# Patient Record
Sex: Female | Born: 1955
Health system: Southern US, Community
[De-identification: ages and names within clinical notes are randomized; demographics above are authoritative.]

## PROBLEM LIST (undated history)

## (undated) DIAGNOSIS — K219 Gastro-esophageal reflux disease without esophagitis: Secondary | ICD-10-CM

## (undated) DIAGNOSIS — K649 Unspecified hemorrhoids: Secondary | ICD-10-CM

## (undated) DIAGNOSIS — G5601 Carpal tunnel syndrome, right upper limb: Secondary | ICD-10-CM

## (undated) DIAGNOSIS — F5104 Psychophysiologic insomnia: Secondary | ICD-10-CM

## (undated) DIAGNOSIS — Z972 Presence of dental prosthetic device (complete) (partial): Secondary | ICD-10-CM

## (undated) DIAGNOSIS — Z780 Asymptomatic menopausal state: Secondary | ICD-10-CM

## (undated) DIAGNOSIS — M48061 Spinal stenosis, lumbar region without neurogenic claudication: Secondary | ICD-10-CM

## (undated) DIAGNOSIS — T7840XA Allergy, unspecified, initial encounter: Secondary | ICD-10-CM

## (undated) DIAGNOSIS — E8881 Metabolic syndrome: Secondary | ICD-10-CM

## (undated) DIAGNOSIS — E119 Type 2 diabetes mellitus without complications: Secondary | ICD-10-CM

## (undated) DIAGNOSIS — E669 Obesity, unspecified: Secondary | ICD-10-CM

## (undated) DIAGNOSIS — N63 Unspecified lump in unspecified breast: Secondary | ICD-10-CM

## (undated) DIAGNOSIS — K5909 Other constipation: Secondary | ICD-10-CM

## (undated) DIAGNOSIS — M1990 Unspecified osteoarthritis, unspecified site: Secondary | ICD-10-CM

## (undated) DIAGNOSIS — I10 Essential (primary) hypertension: Secondary | ICD-10-CM

## (undated) DIAGNOSIS — E039 Hypothyroidism, unspecified: Secondary | ICD-10-CM

## (undated) DIAGNOSIS — M7541 Impingement syndrome of right shoulder: Secondary | ICD-10-CM

## (undated) DIAGNOSIS — R809 Proteinuria, unspecified: Secondary | ICD-10-CM

## (undated) HISTORY — DX: Gastro-esophageal reflux disease without esophagitis: K21.9

## (undated) HISTORY — DX: Metabolic syndrome: E88.810

## (undated) HISTORY — DX: Unspecified hemorrhoids: K64.9

## (undated) HISTORY — DX: Essential (primary) hypertension: I10

## (undated) HISTORY — DX: Impingement syndrome of right shoulder: M75.41

## (undated) HISTORY — PX: TONSILLECTOMY: SUR1361

## (undated) HISTORY — PX: TUBAL LIGATION: SHX77

## (undated) HISTORY — DX: Obesity, unspecified: E66.9

## (undated) HISTORY — DX: Asymptomatic menopausal state: Z78.0

## (undated) HISTORY — DX: Type 2 diabetes mellitus without complications: E11.9

## (undated) HISTORY — DX: Proteinuria, unspecified: R80.9

## (undated) HISTORY — DX: Unspecified lump in unspecified breast: N63.0

## (undated) HISTORY — DX: Spinal stenosis, lumbar region without neurogenic claudication: M48.061

## (undated) HISTORY — DX: Other constipation: K59.09

## (undated) HISTORY — DX: Metabolic syndrome: E88.81

## (undated) HISTORY — DX: Carpal tunnel syndrome, right upper limb: G56.01

## (undated) HISTORY — DX: Psychophysiologic insomnia: F51.04

## (undated) HISTORY — DX: Allergy, unspecified, initial encounter: T78.40XA

## (undated) HISTORY — DX: Hypothyroidism, unspecified: E03.9

---

## 2004-11-14 ENCOUNTER — Ambulatory Visit: Payer: Self-pay | Admitting: Family Medicine

## 2005-09-04 LAB — HM DEXA SCAN

## 2005-11-20 ENCOUNTER — Ambulatory Visit: Payer: Self-pay

## 2006-05-07 LAB — HM COLONOSCOPY: HM Colonoscopy: NORMAL

## 2006-11-06 ENCOUNTER — Ambulatory Visit: Payer: Self-pay | Admitting: Gastroenterology

## 2006-11-22 ENCOUNTER — Ambulatory Visit: Payer: Self-pay | Admitting: Family Medicine

## 2006-11-26 ENCOUNTER — Ambulatory Visit: Payer: Self-pay | Admitting: Family Medicine

## 2008-04-13 ENCOUNTER — Ambulatory Visit: Payer: Self-pay | Admitting: Family Medicine

## 2008-04-13 DIAGNOSIS — N63 Unspecified lump in unspecified breast: Secondary | ICD-10-CM

## 2008-04-26 ENCOUNTER — Ambulatory Visit: Payer: Self-pay | Admitting: Family Medicine

## 2008-04-27 ENCOUNTER — Ambulatory Visit: Payer: Self-pay | Admitting: Family Medicine

## 2008-05-07 HISTORY — PX: CARPAL TUNNEL RELEASE: SHX101

## 2008-10-05 ENCOUNTER — Ambulatory Visit: Payer: Self-pay | Admitting: Specialist

## 2008-10-14 ENCOUNTER — Ambulatory Visit: Payer: Self-pay | Admitting: Specialist

## 2009-09-01 ENCOUNTER — Ambulatory Visit: Payer: Self-pay | Admitting: Family Medicine

## 2010-09-15 ENCOUNTER — Ambulatory Visit: Payer: Self-pay | Admitting: Family Medicine

## 2011-12-10 LAB — HM PAP SMEAR: HM PAP: NORMAL

## 2012-01-01 ENCOUNTER — Ambulatory Visit: Payer: Self-pay | Admitting: Family Medicine

## 2013-01-08 ENCOUNTER — Ambulatory Visit: Payer: Self-pay | Admitting: Family Medicine

## 2013-09-29 ENCOUNTER — Ambulatory Visit: Payer: Self-pay | Admitting: Physical Medicine and Rehabilitation

## 2014-01-12 ENCOUNTER — Ambulatory Visit: Payer: Self-pay | Admitting: Family Medicine

## 2014-01-12 LAB — HM MAMMOGRAPHY: HM Mammogram: NORMAL

## 2014-05-26 LAB — LIPID PANEL
Cholesterol: 178 mg/dL (ref 0–200)
HDL: 52 mg/dL (ref 35–70)
LDL Cholesterol: 112 mg/dL
TRIGLYCERIDES: 72 mg/dL (ref 40–160)

## 2014-11-21 ENCOUNTER — Encounter: Payer: Self-pay | Admitting: Family Medicine

## 2014-11-21 DIAGNOSIS — E785 Hyperlipidemia, unspecified: Secondary | ICD-10-CM | POA: Insufficient documentation

## 2014-11-21 DIAGNOSIS — I10 Essential (primary) hypertension: Secondary | ICD-10-CM | POA: Insufficient documentation

## 2014-11-21 DIAGNOSIS — R809 Proteinuria, unspecified: Secondary | ICD-10-CM | POA: Insufficient documentation

## 2014-11-21 DIAGNOSIS — E039 Hypothyroidism, unspecified: Secondary | ICD-10-CM | POA: Insufficient documentation

## 2014-11-21 DIAGNOSIS — IMO0002 Reserved for concepts with insufficient information to code with codable children: Secondary | ICD-10-CM | POA: Insufficient documentation

## 2014-11-21 DIAGNOSIS — G4700 Insomnia, unspecified: Secondary | ICD-10-CM | POA: Insufficient documentation

## 2014-11-21 DIAGNOSIS — M170 Bilateral primary osteoarthritis of knee: Secondary | ICD-10-CM | POA: Insufficient documentation

## 2014-11-21 DIAGNOSIS — J309 Allergic rhinitis, unspecified: Secondary | ICD-10-CM | POA: Insufficient documentation

## 2014-11-21 DIAGNOSIS — K644 Residual hemorrhoidal skin tags: Secondary | ICD-10-CM | POA: Insufficient documentation

## 2014-11-21 DIAGNOSIS — M48061 Spinal stenosis, lumbar region without neurogenic claudication: Secondary | ICD-10-CM | POA: Insufficient documentation

## 2014-11-21 DIAGNOSIS — Z780 Asymptomatic menopausal state: Secondary | ICD-10-CM | POA: Insufficient documentation

## 2014-11-21 DIAGNOSIS — M7540 Impingement syndrome of unspecified shoulder: Secondary | ICD-10-CM | POA: Insufficient documentation

## 2014-11-21 DIAGNOSIS — K5909 Other constipation: Secondary | ICD-10-CM | POA: Insufficient documentation

## 2014-11-21 DIAGNOSIS — K219 Gastro-esophageal reflux disease without esophagitis: Secondary | ICD-10-CM | POA: Insufficient documentation

## 2014-11-21 DIAGNOSIS — G5600 Carpal tunnel syndrome, unspecified upper limb: Secondary | ICD-10-CM | POA: Insufficient documentation

## 2014-11-21 DIAGNOSIS — E8881 Metabolic syndrome: Secondary | ICD-10-CM | POA: Insufficient documentation

## 2014-11-23 ENCOUNTER — Ambulatory Visit (INDEPENDENT_AMBULATORY_CARE_PROVIDER_SITE_OTHER): Payer: 59 | Admitting: Family Medicine

## 2014-11-23 ENCOUNTER — Encounter: Payer: Self-pay | Admitting: Family Medicine

## 2014-11-23 VITALS — BP 130/60 | HR 104 | Temp 98.8°F | Resp 18 | Ht 63.5 in | Wt 198.6 lb

## 2014-11-23 DIAGNOSIS — I10 Essential (primary) hypertension: Secondary | ICD-10-CM

## 2014-11-23 DIAGNOSIS — R000 Tachycardia, unspecified: Secondary | ICD-10-CM

## 2014-11-23 DIAGNOSIS — K5900 Constipation, unspecified: Secondary | ICD-10-CM | POA: Diagnosis not present

## 2014-11-23 DIAGNOSIS — E038 Other specified hypothyroidism: Secondary | ICD-10-CM | POA: Diagnosis not present

## 2014-11-23 DIAGNOSIS — E785 Hyperlipidemia, unspecified: Secondary | ICD-10-CM | POA: Diagnosis not present

## 2014-11-23 DIAGNOSIS — K5909 Other constipation: Secondary | ICD-10-CM

## 2014-11-23 MED ORDER — AMLODIPINE BESY-BENAZEPRIL HCL 5-40 MG PO CAPS: 1.0000 | ORAL_CAPSULE | Freq: Every morning | ORAL | Status: DC

## 2014-11-23 MED ORDER — HYDROCHLOROTHIAZIDE 12.5 MG PO TABS: 12.5000 mg | ORAL_TABLET | Freq: Every morning | ORAL | Status: DC

## 2014-11-23 MED ORDER — METOPROLOL SUCCINATE ER 50 MG PO TB24: 50.0000 mg | ORAL_TABLET | Freq: Every day | ORAL | Status: DC

## 2014-11-23 MED ORDER — ATORVASTATIN CALCIUM 40 MG PO TABS: 40.0000 mg | ORAL_TABLET | Freq: Every day | ORAL | Status: DC

## 2014-11-23 NOTE — Progress Notes (Signed)
Name: Judy Graves   MRN: [id number]    DOB: 1956/01/01   Date:11/23/2014       Progress Note  Subjective  Chief Complaint  Chief Complaint  Patient presents with  . Medication Refill    3 month F/U  . Hyperlipidemia  . Hypothyroidism    Getting worst-Patient is experiencing Dry Skin, Hair Loss, Cold and Heat Intolerance, Constipation  . Hypertension  . Gastrophageal Reflux    Patient is experncing alot of gas, and feels like medication is not helping  . Constipation    When patient takes Linzess, she feels like it dehydrates hers and has to run to the bathroom multiple times.     HPI  HTN: she is taking medications as prescribed, still has some lower extremity edema, no chest pain or palpitation.  BP is at goal today  Hyperlipidemia: last lipid panel was not at goal, still taking Atorvastatin 20 mg, dose was supposed to be increased, tolerating medication well.  GERD: she has gas, but denies epigastric pain or heartburn.  Constipation: she takes Linzess prn only because it causes diarrhea, did not respond to Miralax  Hypothyroidism: she is always tired, takes medication as directed. She also has constipation and dry skin  Depression: she states she has been depressed since April when father was diagnosed with CHF and was under Hospice. He died in the beginning of 2022/11/08.  She denies crying spells, she has anhedonia, feels sad. She refuses medication  Patient Active Problem List   Diagnosis Date Noted  . Benign hypertension 11/21/2014  . Chronic constipation 11/21/2014  . Insomnia, persistent 11/21/2014  . Dyslipidemia 11/21/2014  . Gastro-esophageal reflux disease without esophagitis 11/21/2014  . External hemorrhoids 11/21/2014  . Adult hypothyroidism 11/21/2014  . Impingement syndrome of shoulder 11/21/2014  . Menopause 11/21/2014  . Dysmetabolic syndrome 76/28/3151  . Carpal tunnel syndrome 11/21/2014  . Adult BMI 30+ 11/21/2014  . Allergic rhinitis 11/21/2014  .  Primary osteoarthritis of both knees 11/21/2014  . Abnormal presence of protein in urine 11/21/2014  . Lumbar canal stenosis 11/21/2014  . Lump or mass in breast 04/13/2008    Past Surgical History  Procedure Laterality Date  . Tubal ligation    . Tonsillectomy    . Carpal tunnel release Right 2010    Dr. Sabra Heck    Family History  Problem Relation Age of Onset  . Cancer Mother     Breast  . Diabetes Mother   . Hypertension Father   . CVA Father     34's  . Hypertension Sister   . Hypertension Brother     History   Social History  . Marital Status: Married    Spouse Name: N/A  . Number of Children: N/A  . Years of Education: N/A   Occupational History  . Not on file.   Social History Main Topics  . Smoking status: Never Smoker   . Smokeless tobacco: Never Used  . Alcohol Use: No  . Drug Use: No  . Sexual Activity:    Partners: Male   Other Topics Concern  . Not on file   Social History Narrative  . No narrative on file     Current outpatient prescriptions:  .  amLODipine-benazepril (LOTREL) 5-40 MG per capsule, Take 1 capsule by mouth every morning., Disp: 30 capsule, Rfl: 3 .  aspirin 81 MG chewable tablet, Chew 1 tablet by mouth daily., Disp: , Rfl:  .  atorvastatin (LIPITOR) 40 MG tablet, Take 1  tablet (40 mg total) by mouth daily., Disp: 30 tablet, Rfl: 3 .  hydrochlorothiazide (HYDRODIURIL) 12.5 MG tablet, Take 1 tablet (12.5 mg total) by mouth every morning., Disp: 30 tablet, Rfl: 3 .  lansoprazole (PREVACID) 30 MG capsule, Take 1 capsule by mouth 2 (two) times daily., Disp: , Rfl:  .  levothyroxine (SYNTHROID, LEVOTHROID) 50 MCG tablet, Take 1 tablet by mouth daily. And 2 on Sundays, Disp: , Rfl:  .  Linaclotide (LINZESS) 145 MCG CAPS capsule, Take 1 capsule by mouth daily., Disp: , Rfl:  .  metoprolol succinate (TOPROL-XL) 50 MG 24 hr tablet, Take 1 tablet (50 mg total) by mouth daily., Disp: 30 tablet, Rfl: 3  No Known  Allergies   ROS  Constitutional: Negative for fever or weight change.  Respiratory: Negative for cough and shortness of breath.   Cardiovascular: Negative for chest pain or palpitations.  Gastrointestinal: Negative for abdominal pain, no bowel changes.  Musculoskeletal: Negative for gait problem or joint swelling.  Skin: Negative for rash.  Neurological: Negative for dizziness or headache.  No other specific complaints in a complete review of systems (except as listed in HPI above).  Objective  Filed Vitals:   11/23/14 1050  BP: 130/60  Pulse: 104  Temp: 98.8 F (37.1 C)  TempSrc: Oral  Resp: 18  Height: 5' 3.5" (1.613 m)  Weight: 198 lb 9.6 oz (90.084 kg)  SpO2: 99%    Body mass index is 34.62 kg/(m^2).  Physical Exam   Constitutional: Patient appears well-developed and well-nourished. Obese No distress.  Eyes:  No scleral icterus. PERL Neck: Normal range of motion. Neck supple. Cardiovascular: Normal rate, regular rhythm and normal heart sounds.  No murmur heard. No BLE edema. Pulmonary/Chest: Effort normal and breath sounds normal. No respiratory distress. Abdominal: Soft.  There is no tenderness. Psychiatric: Patient was upset, always seems angry during her visit, poor eye contact   Judgment and thought content normal.    PHQ2/9: Depression screen PHQ 2/9 11/23/2014  Decreased Interest 0  Down, Depressed, Hopeless 0  PHQ - 2 Score 0    Fall Risk: Fall Risk  11/23/2014  Falls in the past year? No      Assessment & Plan  1. Benign hypertension  - metoprolol succinate (TOPROL-XL) 50 MG 24 hr tablet; Take 1 tablet (50 mg total) by mouth daily.  Dispense: 30 tablet; Refill: 3 - hydrochlorothiazide (HYDRODIURIL) 12.5 MG tablet; Take 1 tablet (12.5 mg total) by mouth every morning.  Dispense: 30 tablet; Refill: 3 - amLODipine-benazepril (LOTREL) 5-40 MG per capsule; Take 1 capsule by mouth every morning.  Dispense: 30 capsule; Refill: 3  2. Chronic  constipation Taking Linzess prn only   3. Dyslipidemia Increase dose of Lipitor - atorvastatin (LIPITOR) 40 MG tablet; Take 1 tablet (40 mg total) by mouth daily.  Dispense: 30 tablet; Refill: 3  4. Other specified hypothyroidism Continue current dose of Levothyroxine   5. Tachycardia We will check TSH and CBC

## 2014-11-25 ENCOUNTER — Other Ambulatory Visit: Payer: Self-pay | Admitting: Family Medicine

## 2014-11-25 LAB — CBC WITH DIFFERENTIAL/PLATELET
BASOS ABS: 0 10*3/uL (ref 0.0–0.2)
Basos: 0 %
EOS (ABSOLUTE): 0.1 10*3/uL (ref 0.0–0.4)
Eos: 2 %
Hematocrit: 40 % (ref 34.0–46.6)
Hemoglobin: 13.8 g/dL (ref 11.1–15.9)
IMMATURE GRANS (ABS): 0 10*3/uL (ref 0.0–0.1)
Immature Granulocytes: 0 %
LYMPHS ABS: 3.6 10*3/uL — AB (ref 0.7–3.1)
Lymphs: 43 %
MCH: 30.3 pg (ref 26.6–33.0)
MCHC: 34.5 g/dL (ref 31.5–35.7)
MCV: 88 fL (ref 79–97)
MONOCYTES: 9 %
MONOS ABS: 0.8 10*3/uL (ref 0.1–0.9)
Neutrophils Absolute: 3.8 10*3/uL (ref 1.4–7.0)
Neutrophils: 46 %
Platelets: 227 10*3/uL (ref 150–379)
RBC: 4.56 x10E6/uL (ref 3.77–5.28)
RDW: 13.3 % (ref 12.3–15.4)
WBC: 8.3 10*3/uL (ref 3.4–10.8)

## 2014-11-25 LAB — TSH: TSH: 2.79 u[IU]/mL (ref 0.450–4.500)

## 2014-12-04 ENCOUNTER — Other Ambulatory Visit: Payer: Self-pay | Admitting: Family Medicine

## 2015-02-21 ENCOUNTER — Telehealth: Payer: Self-pay | Admitting: Family Medicine

## 2015-02-21 ENCOUNTER — Other Ambulatory Visit: Payer: Self-pay

## 2015-02-21 DIAGNOSIS — E785 Hyperlipidemia, unspecified: Secondary | ICD-10-CM

## 2015-02-21 DIAGNOSIS — I10 Essential (primary) hypertension: Secondary | ICD-10-CM

## 2015-02-21 NOTE — Telephone Encounter (Signed)
Due to husband's job her pharmacy has now changed to the [facility] on [address] in [place].  Patient also needs the following refills:  Metoprolol Lansoprazole Levothyroxine Hydrochlorothiazide Atorvastatin

## 2015-02-22 ENCOUNTER — Other Ambulatory Visit: Payer: Self-pay

## 2015-02-22 DIAGNOSIS — I10 Essential (primary) hypertension: Secondary | ICD-10-CM

## 2015-02-22 MED ORDER — ATORVASTATIN CALCIUM 40 MG PO TABS: 40.0000 mg | ORAL_TABLET | Freq: Every day | ORAL | Status: DC

## 2015-02-22 MED ORDER — HYDROCHLOROTHIAZIDE 12.5 MG PO TABS: 12.5000 mg | ORAL_TABLET | Freq: Every morning | ORAL | Status: DC

## 2015-02-22 MED ORDER — LANSOPRAZOLE 30 MG PO CPDR: 30.0000 mg | DELAYED_RELEASE_CAPSULE | Freq: Two times a day (BID) | ORAL | Status: DC

## 2015-02-22 MED ORDER — LEVOTHYROXINE SODIUM 50 MCG PO TABS: 50.0000 ug | ORAL_TABLET | Freq: Every day | ORAL | Status: DC

## 2015-02-22 MED ORDER — AMLODIPINE BESY-BENAZEPRIL HCL 5-40 MG PO CAPS: 1.0000 | ORAL_CAPSULE | Freq: Every morning | ORAL | Status: DC

## 2015-02-22 MED ORDER — METOPROLOL SUCCINATE ER 50 MG PO TB24: 50.0000 mg | ORAL_TABLET | Freq: Every day | ORAL | Status: DC

## 2015-02-22 NOTE — Telephone Encounter (Signed)
Her ins is requiring a 90 day supply

## 2015-04-07 ENCOUNTER — Ambulatory Visit: Payer: 59 | Admitting: Family Medicine

## 2015-04-07 ENCOUNTER — Encounter: Payer: Self-pay | Admitting: Family Medicine

## 2015-04-07 ENCOUNTER — Ambulatory Visit (INDEPENDENT_AMBULATORY_CARE_PROVIDER_SITE_OTHER): Payer: 59 | Admitting: Family Medicine

## 2015-04-07 VITALS — BP 140/82 | HR 91 | Temp 98.8°F | Resp 16 | Ht 66.0 in | Wt 202.8 lb

## 2015-04-07 DIAGNOSIS — E785 Hyperlipidemia, unspecified: Secondary | ICD-10-CM | POA: Diagnosis not present

## 2015-04-07 DIAGNOSIS — Z87898 Personal history of other specified conditions: Secondary | ICD-10-CM

## 2015-04-07 DIAGNOSIS — K5909 Other constipation: Secondary | ICD-10-CM

## 2015-04-07 DIAGNOSIS — K219 Gastro-esophageal reflux disease without esophagitis: Secondary | ICD-10-CM | POA: Diagnosis not present

## 2015-04-07 DIAGNOSIS — G4700 Insomnia, unspecified: Secondary | ICD-10-CM | POA: Diagnosis not present

## 2015-04-07 DIAGNOSIS — I10 Essential (primary) hypertension: Secondary | ICD-10-CM

## 2015-04-07 DIAGNOSIS — E038 Other specified hypothyroidism: Secondary | ICD-10-CM

## 2015-04-07 DIAGNOSIS — K5900 Constipation, unspecified: Secondary | ICD-10-CM

## 2015-04-07 MED ORDER — ATORVASTATIN CALCIUM 40 MG PO TABS: 40.0000 mg | ORAL_TABLET | Freq: Every day | ORAL | Status: DC

## 2015-04-07 MED ORDER — AMLODIPINE BESYLATE 10 MG PO TABS: 10.0000 mg | ORAL_TABLET | Freq: Every day | ORAL | Status: DC

## 2015-04-07 NOTE — Progress Notes (Signed)
Name: Judy Graves   MRN: [id number]    DOB: [birth year]   Date:04/07/2015       Progress Note  Subjective  Chief Complaint  Chief Complaint  Patient presents with  . Medication Refill    follow-up  . Hypertension  . Hyperlipidemia  . Hypothyroidism  . Constipation    resolved    HPI  Chronic constipation: used to take Linzess, but now bowel movements are regulated without the medication, normal bowel movements twice daily. No abdominal pain, no blood in stools. She states she stopped sodas and is drinking more water daily. She also started eating more fruit  HTN: bp is at goal, compliant with medication. She had to go to Urgent Care recently with facial swelling ( both eyes and lips - worse on left side ), no problems breathing. She was continued on the ACE inhibitor and given follow up with allergist. Explained that ace can cause angioedema, we will change medication from Lotrel to Norvasc 10 mg. Keep follow up with allergist. No chest pain.   Hypothyroidism: she has noticed some weight gain, also feeling more tired than usual. Compliant with medication  Hyperlipidemia: taking Lipitor daily, she has some nocturnal cramping, advised to drink more water and try co-Q 10 supplementation otc.   GERD: she states she is taking medication, but continues to have some substernal chest pain when she goes to bed, eats right before she goes to bed.   Insomnia; she is not taking any medication at this time, lost her mother-in-law last year, her father this Summer and her 59 yo niece one week ago. She does not want to take prescription medication. She works 2nd shift.  She would like to try otc medication first.   Patient Active Problem List   Diagnosis Date Noted  . Benign hypertension 11/21/2014  . Chronic constipation 11/21/2014  . Insomnia, persistent 11/21/2014  . Dyslipidemia 11/21/2014  . Gastro-esophageal reflux disease without esophagitis 11/21/2014  . External hemorrhoids  11/21/2014  . Adult hypothyroidism 11/21/2014  . Impingement syndrome of shoulder 11/21/2014  . Menopause 11/21/2014  . Dysmetabolic syndrome 123XX123  . Carpal tunnel syndrome 11/21/2014  . Adult BMI 30+ 11/21/2014  . Allergic rhinitis 11/21/2014  . Primary osteoarthritis of both knees 11/21/2014  . Abnormal presence of protein in urine 11/21/2014  . Lumbar canal stenosis 11/21/2014  . Lump or mass in breast 04/13/2008    Past Surgical History  Procedure Laterality Date  . Tubal ligation    . Tonsillectomy    . Carpal tunnel release Right 2010    Dr. Sabra Heck    Family History  Problem Relation Age of Onset  . Cancer Mother     Breast  . Diabetes Mother   . Hypertension Father   . CVA Father     77's  . Hypertension Sister   . Hypertension Brother     Social History   Social History  . Marital Status: Married    Spouse Name: N/A  . Number of Children: N/A  . Years of Education: N/A   Occupational History  . Not on file.   Social History Main Topics  . Smoking status: Never Smoker   . Smokeless tobacco: Never Used  . Alcohol Use: No  . Drug Use: No  . Sexual Activity:    Partners: Male   Other Topics Concern  . Not on file   Social History Narrative     Current outpatient prescriptions:  .  amLODipine (NORVASC) 10  MG tablet, Take 1 tablet (10 mg total) by mouth daily. In place of Lotrel secondary to angioedema, Disp: 90 tablet, Rfl: 0 .  aspirin 81 MG chewable tablet, Chew 1 tablet by mouth daily., Disp: , Rfl:  .  atorvastatin (LIPITOR) 40 MG tablet, Take 1 tablet (40 mg total) by mouth daily., Disp: 90 tablet, Rfl: 2 .  hydrochlorothiazide (HYDRODIURIL) 12.5 MG tablet, Take 1 tablet (12.5 mg total) by mouth every morning., Disp: 30 tablet, Rfl: 3 .  lansoprazole (PREVACID) 30 MG capsule, Take 1 capsule (30 mg total) by mouth 2 (two) times daily., Disp: 90 capsule, Rfl: 3 .  levothyroxine (SYNTHROID, LEVOTHROID) 50 MCG tablet, Take 1 tablet (50 mcg  total) by mouth daily. And 2 on Sundays, Disp: 90 tablet, Rfl: 3 .  metoprolol succinate (TOPROL-XL) 50 MG 24 hr tablet, Take 1 tablet (50 mg total) by mouth daily., Disp: 90 tablet, Rfl: 3  Allergies  Allergen Reactions  . Ace Inhibitors Swelling     ROS  Constitutional: Negative for fever but has  weight change.  Respiratory: Negative for cough and shortness of breath.   Cardiovascular: Negative for chest pain or palpitations.  Gastrointestinal: Negative for abdominal pain, no bowel changes.  Musculoskeletal: Negative for gait problem or joint swelling.  Skin: Negative for rash.  Neurological: Negative for dizziness or headache.  No other specific complaints in a complete review of systems (except as listed in HPI above).  Objective  Filed Vitals:   04/07/15 1049  BP: 140/82  Pulse: 91  Temp: 98.8 F (37.1 C)  TempSrc: Oral  Resp: 16  Height: 5\' 6"  (1.676 m)  Weight: 202 lb 12.8 oz (91.989 kg)  SpO2: 94%    Body mass index is 32.75 kg/(m^2).  Physical Exam  Constitutional: Patient appears well-developed and well-nourished. Obese  No distress.  HEENT: head atraumatic, normocephalic, pupils equal and reactive to light, neck supple, throat within normal limits Cardiovascular: Normal rate, regular rhythm and normal heart sounds.  No murmur heard. No BLE edema. Pulmonary/Chest: Effort normal and breath sounds normal. No respiratory distress. Abdominal: Soft.  There is no tenderness. Psychiatric: Patient has a normal mood and affect. behavior is normal. Judgment and thought content normal.   PHQ2/9: Depression screen [facility] 2/9 04/07/2015 11/23/2014  Decreased Interest 0 0  Down, Depressed, Hopeless 0 0  PHQ - 2 Score 0 0    Fall Risk: Fall Risk  04/07/2015 11/23/2014  Falls in the past year? No No     Functional Status Survey: Is the patient deaf or have difficulty hearing?: No Does the patient have difficulty seeing, even when wearing glasses/contacts?: Yes  (glasses) Does the patient have difficulty concentrating, remembering, or making decisions?: No Does the patient have difficulty walking or climbing stairs?: No Does the patient have difficulty dressing or bathing?: No Does the patient have difficulty doing errands alone such as visiting a doctor's office or shopping?: No    Assessment & Plan  1. Chronic constipation  Doing well, off medication   2. Benign hypertension  Adjust medication because of angioedema - amLODipine (NORVASC) 10 MG tablet; Take 1 tablet (10 mg total) by mouth daily. In place of Lotrel secondary to angioedema  Dispense: 90 tablet; Refill: 0  - Comprehensive metabolic panel  3. Dyslipidemia  - atorvastatin (LIPITOR) 40 MG tablet; Take 1 tablet (40 mg total) by mouth daily.  Dispense: 90 tablet; Refill: 2  4. Other specified hypothyroidism  -TSH  5. Insomnia, persistent  She will  try otc medication   6. History of angioedema  Keep follow up with allergist, stop ACE   7. Gastro-esophageal reflux disease without esophagitis  May take Lanzoprazole before evening meal, do not eat within 1-2 hours before going to bed

## 2015-04-08 LAB — COMPREHENSIVE METABOLIC PANEL
ALK PHOS: 88 IU/L (ref 39–117)
ALT: 23 IU/L (ref 0–32)
AST: 16 IU/L (ref 0–40)
Albumin/Globulin Ratio: 1.7 (ref 1.1–2.5)
Albumin: 4.3 g/dL (ref 3.5–5.5)
BUN/Creatinine Ratio: 24 — ABNORMAL HIGH (ref 9–23)
BUN: 22 mg/dL (ref 6–24)
Bilirubin Total: 0.5 mg/dL (ref 0.0–1.2)
CALCIUM: 9.5 mg/dL (ref 8.7–10.2)
CO2: 27 mmol/L (ref 18–29)
CREATININE: 0.92 mg/dL (ref 0.57–1.00)
Chloride: 99 mmol/L (ref 97–106)
GFR calc Af Amer: 79 mL/min/{1.73_m2} (ref >59)
GFR calc non Af Amer: 69 mL/min/{1.73_m2} (ref >59)
GLOBULIN, TOTAL: 2.6 g/dL (ref 1.5–4.5)
GLUCOSE: 120 mg/dL — AB (ref 65–99)
POTASSIUM: 4.9 mmol/L (ref 3.5–5.2)
SODIUM: 140 mmol/L (ref 136–144)
Total Protein: 6.9 g/dL (ref 6.0–8.5)

## 2015-04-08 LAB — TSH: TSH: 4.16 u[IU]/mL (ref 0.450–4.500)

## 2015-05-11 ENCOUNTER — Encounter: Payer: Self-pay | Admitting: Family Medicine

## 2015-05-11 ENCOUNTER — Ambulatory Visit (INDEPENDENT_AMBULATORY_CARE_PROVIDER_SITE_OTHER): Payer: 59 | Admitting: Family Medicine

## 2015-05-11 VITALS — BP 138/72 | HR 90 | Temp 98.3°F | Resp 18 | Ht 66.0 in | Wt 204.9 lb

## 2015-05-11 DIAGNOSIS — I10 Essential (primary) hypertension: Secondary | ICD-10-CM | POA: Diagnosis not present

## 2015-05-11 DIAGNOSIS — R600 Localized edema: Secondary | ICD-10-CM

## 2015-05-11 MED ORDER — CHLORTHALIDONE 25 MG PO TABS: 25.0000 mg | ORAL_TABLET | Freq: Every day | ORAL | Status: DC

## 2015-05-11 NOTE — Progress Notes (Signed)
Name: Judy Graves   MRN: [id number]    DOB: 07-13-1955   Date:05/11/2015       Progress Note  Subjective  Chief Complaint  Chief Complaint  Patient presents with  . Medication Management    1 month F/U BP  . Hypertension    Last visit took patient off of Lotrel and added Amlodipine 10 mg due to angioedema. Symptoms are unchanged, still having swelling in her feet and ankles.    HPI  HTN: she had an angioedema and we had to stop Benazepril, since than she has been on 10 mg of Norvasc, 12.5 mg of HCTZ, and Metoprolol ER 50 mg daily, she has noticed significant lower extremity edema and does not want to take the medication. BP has been at goal. She is very frustrated , afraid of trying ARB. No chest pain, no palpitation, but she has noticed some decrease in exercise tolerance   Patient Active Problem List   Diagnosis Date Noted  . Benign hypertension 11/21/2014  . Chronic constipation 11/21/2014  . Insomnia, persistent 11/21/2014  . Dyslipidemia 11/21/2014  . Gastro-esophageal reflux disease without esophagitis 11/21/2014  . External hemorrhoids 11/21/2014  . Adult hypothyroidism 11/21/2014  . Impingement syndrome of shoulder 11/21/2014  . Menopause 11/21/2014  . Dysmetabolic syndrome 123XX123  . Carpal tunnel syndrome 11/21/2014  . Adult BMI 30+ 11/21/2014  . Allergic rhinitis 11/21/2014  . Primary osteoarthritis of both knees 11/21/2014  . Abnormal presence of protein in urine 11/21/2014  . Lumbar canal stenosis 11/21/2014  . Lump or mass in breast 04/13/2008    Past Surgical History  Procedure Laterality Date  . Tubal ligation    . Tonsillectomy    . Carpal tunnel release Right 2010    Dr. Sabra Heck    Family History  Problem Relation Age of Onset  . Cancer Mother     Breast  . Diabetes Mother   . Hypertension Father   . CVA Father     33's  . Hypertension Sister   . Hypertension Brother     Social History   Social History  . Marital Status: Married    Spouse Name: N/A  . Number of Children: N/A  . Years of Education: N/A   Occupational History  . Not on file.   Social History Main Topics  . Smoking status: Never Smoker   . Smokeless tobacco: Never Used  . Alcohol Use: No  . Drug Use: No  . Sexual Activity:    Partners: Male   Other Topics Concern  . Not on file   Social History Narrative     Current outpatient prescriptions:  .  aspirin 81 MG chewable tablet, Chew 1 tablet by mouth daily., Disp: , Rfl:  .  atorvastatin (LIPITOR) 40 MG tablet, Take 1 tablet (40 mg total) by mouth daily., Disp: 90 tablet, Rfl: 2 .  lansoprazole (PREVACID) 30 MG capsule, Take 1 capsule (30 mg total) by mouth 2 (two) times daily., Disp: 90 capsule, Rfl: 3 .  levothyroxine (SYNTHROID, LEVOTHROID) 50 MCG tablet, Take 1 tablet (50 mcg total) by mouth daily. And 2 on Sundays, Disp: 90 tablet, Rfl: 3 .  metoprolol succinate (TOPROL-XL) 50 MG 24 hr tablet, Take 1 tablet (50 mg total) by mouth daily., Disp: 90 tablet, Rfl: 3 .  chlorthalidone (HYGROTON) 25 MG tablet, Take 1 tablet (25 mg total) by mouth daily., Disp: 30 tablet, Rfl: 0  Allergies  Allergen Reactions  . Ace Inhibitors Swelling  ROS  Ten systems reviewed and is negative except as mentioned in HPI   Objective  Filed Vitals:   05/11/15 1116  BP: 138/72  Pulse: 90  Temp: 98.3 F (36.8 C)  TempSrc: Oral  Resp: 18  Height: 5\' 6"  (1.676 m)  Weight: 204 lb 14.4 oz (92.942 kg)  SpO2: 98%    Body mass index is 33.09 kg/(m^2).  Physical Exam  Constitutional: Patient appears well-developed and well-nourished. Obese  No distress.  HEENT: head atraumatic, normocephalic, pupils equal and reactive to light,  neck supple, throat within normal limits Cardiovascular: Normal rate, regular rhythm and normal heart sounds.  No murmur heard. 1 plus pitting  BLE edema. Pulmonary/Chest: Effort normal and breath sounds normal. No respiratory distress. Abdominal: Soft.  There is no  tenderness. Psychiatric: Patient has a normal mood and affect. behavior is normal. Judgment and thought content normal.  Recent Results (from the past 2160 hour(s))  Comprehensive metabolic panel     Status: Abnormal   Collection Time: 04/07/15 11:56 AM  Result Value Ref Range   Glucose 120 (H) 65 - 99 mg/dL   BUN 22 6 - 24 mg/dL   Creatinine, Ser 0.92 0.57 - 1.00 mg/dL   GFR calc non Af Amer 69 >59 mL/min/1.73   GFR calc Af Amer 79 >59 mL/min/1.73   BUN/Creatinine Ratio 24 (H) 9 - 23   Sodium 140 136 - 144 mmol/L    Comment: **Effective April 18, 2015 the reference interval**   for Sodium, Serum will be changing to:                                             134 - 144    Potassium 4.9 3.5 - 5.2 mmol/L   Chloride 99 97 - 106 mmol/L    Comment: **Effective April 18, 2015 the reference interval**   for Chloride, Serum will be changing to:                                              96 - 106    CO2 27 18 - 29 mmol/L   Calcium 9.5 8.7 - 10.2 mg/dL   Total Protein 6.9 6.0 - 8.5 g/dL   Albumin 4.3 3.5 - 5.5 g/dL   Globulin, Total 2.6 1.5 - 4.5 g/dL   Albumin/Globulin Ratio 1.7 1.1 - 2.5   Bilirubin Total 0.5 0.0 - 1.2 mg/dL   Alkaline Phosphatase 88 39 - 117 IU/L   AST 16 0 - 40 IU/L   ALT 23 0 - 32 IU/L  TSH     Status: None   Collection Time: 04/07/15 11:56 AM  Result Value Ref Range   TSH 4.160 0.450 - 4.500 uIU/mL     PHQ2/9: Depression screen [facility] 2/9 04/07/2015 11/23/2014  Decreased Interest 0 0  Down, Depressed, Hopeless 0 0  PHQ - 2 Score 0 0    Fall Risk: Fall Risk  04/07/2015 11/23/2014  Falls in the past year? No No     Assessment & Plan  1. Benign hypertension  Advised to increase potassium intake and check potassium level on her next visit - chlorthalidone (HYGROTON) 25 MG tablet; Take 1 tablet (25 mg total) by mouth daily.  Dispense: 30 tablet;  Refill: 0  2. Bilateral edema of lower extremity  Stop Norvasc

## 2015-05-11 NOTE — Patient Instructions (Signed)

## 2015-06-09 ENCOUNTER — Telehealth: Payer: Self-pay

## 2015-06-09 DIAGNOSIS — I10 Essential (primary) hypertension: Secondary | ICD-10-CM

## 2015-06-09 MED ORDER — CHLORTHALIDONE 25 MG PO TABS: 25.0000 mg | ORAL_TABLET | Freq: Every day | ORAL | Status: DC

## 2015-06-09 NOTE — Telephone Encounter (Signed)
Patient states she was given 30 pills of Chlorthalidone and her follow up is Tuesday 06/14/15 but will run out tomorrow 06/10/15. What would you like her to do?

## 2015-06-14 ENCOUNTER — Encounter: Payer: Self-pay | Admitting: Family Medicine

## 2015-06-14 ENCOUNTER — Ambulatory Visit (INDEPENDENT_AMBULATORY_CARE_PROVIDER_SITE_OTHER): Payer: 59 | Admitting: Family Medicine

## 2015-06-14 VITALS — BP 136/70 | HR 99 | Temp 98.8°F | Resp 16 | Ht 66.0 in | Wt 202.1 lb

## 2015-06-14 DIAGNOSIS — M25462 Effusion, left knee: Secondary | ICD-10-CM | POA: Diagnosis not present

## 2015-06-14 DIAGNOSIS — R600 Localized edema: Secondary | ICD-10-CM | POA: Diagnosis not present

## 2015-06-14 DIAGNOSIS — E785 Hyperlipidemia, unspecified: Secondary | ICD-10-CM | POA: Diagnosis not present

## 2015-06-14 DIAGNOSIS — E038 Other specified hypothyroidism: Secondary | ICD-10-CM

## 2015-06-14 DIAGNOSIS — I10 Essential (primary) hypertension: Secondary | ICD-10-CM | POA: Diagnosis not present

## 2015-06-14 MED ORDER — CHLORTHALIDONE 25 MG PO TABS: 25.0000 mg | ORAL_TABLET | Freq: Every day | ORAL | Status: DC

## 2015-06-14 NOTE — Progress Notes (Signed)
Name: Judy Graves   MRN: [id number]    DOB: [birth year]   Date:06/14/2015       Progress Note  Subjective  Chief Complaint  Chief Complaint  Patient presents with  . Hypertension    patient is here for a 1- month f/u    HPI  HTN: she had an angioedema and we had to stop Benazepril, she was taking Norvac 10 mg and  12.5 mg of HCTZ plus  Metoprolol ER 50 mg daily, but it caused significant lower extremity edema, therefore we stopped Norvasc and HCTZ in January and she has been taking Chlorthalidone 25 mg and Metoprolol without side effects and BP has been at goal. No chest pain, no palpitation.  Left knee pain and effusion: she has pain on left knee when at work and has to stand on cement floors for 8 hours. Recently she has also noticed swelling of left knee. Pain is described as sharp pain on the anterior aspect of knee, no redness or increase in warmth, aggravated by standing or walking, better with rest. Not taking any medication for it. Explained likely OA, but she would like to see Dr. Marry Guan and have effusion drained.   Hypothyroidism: last TSH was towards upper limits of normal, we will recheck today, she is frustrated about inability to lose weight.   Dyslipidemia: taking Lipitor, denies myalgias, no chest pain. Recheck labs   Patient Active Problem List   Diagnosis Date Noted  . Benign hypertension 11/21/2014  . Chronic constipation 11/21/2014  . Insomnia, persistent 11/21/2014  . Dyslipidemia 11/21/2014  . Gastro-esophageal reflux disease without esophagitis 11/21/2014  . External hemorrhoids 11/21/2014  . Adult hypothyroidism 11/21/2014  . Impingement syndrome of shoulder 11/21/2014  . Menopause 11/21/2014  . Dysmetabolic syndrome [id number]  . Carpal tunnel syndrome 11/21/2014  . Adult BMI 30+ 11/21/2014  . Allergic rhinitis 11/21/2014  . Primary osteoarthritis of both knees 11/21/2014  . Abnormal presence of protein in urine 11/21/2014  . Lumbar canal stenosis  11/21/2014  . Lump or mass in breast 04/13/2008    Past Surgical History  Procedure Laterality Date  . Tubal ligation    . Tonsillectomy    . Carpal tunnel release Right 2010    Dr. Sabra Heck    Family History  Problem Relation Age of Onset  . Cancer Mother     Breast  . Diabetes Mother   . Hypertension Father   . CVA Father     50's  . Hypertension Sister   . Hypertension Brother     Social History   Social History  . Marital Status: Married    Spouse Name: N/A  . Number of Children: N/A  . Years of Education: N/A   Occupational History  . Not on file.   Social History Main Topics  . Smoking status: Never Smoker   . Smokeless tobacco: Never Used  . Alcohol Use: No  . Drug Use: No  . Sexual Activity:    Partners: Male   Other Topics Concern  . Not on file   Social History Narrative     Current outpatient prescriptions:  .  aspirin 81 MG chewable tablet, Chew 1 tablet by mouth daily., Disp: , Rfl:  .  atorvastatin (LIPITOR) 40 MG tablet, Take 1 tablet (40 mg total) by mouth daily., Disp: 90 tablet, Rfl: 2 .  chlorthalidone (HYGROTON) 25 MG tablet, Take 1 tablet (25 mg total) by mouth daily., Disp: 30 tablet, Rfl: 0 .  EPIPEN 2-PAK  0.3 MG/0.3ML SOAJ injection, AS DIRECTED AS DIRECTED INJECTION 2, Disp: , Rfl: 1 .  lansoprazole (PREVACID) 30 MG capsule, Take 1 capsule (30 mg total) by mouth 2 (two) times daily., Disp: 90 capsule, Rfl: 3 .  levothyroxine (SYNTHROID, LEVOTHROID) 50 MCG tablet, Take 1 tablet (50 mcg total) by mouth daily. And 2 on Sundays, Disp: 90 tablet, Rfl: 3 .  metoprolol succinate (TOPROL-XL) 50 MG 24 hr tablet, Take 1 tablet (50 mg total) by mouth daily., Disp: 90 tablet, Rfl: 3  Allergies  Allergen Reactions  . Ace Inhibitors Swelling     ROS  Constitutional: Negative for fever , positive for slight weight change.  Respiratory: Negative for cough, she has some  shortness of breath with activity ( able to go up a flight of stairs ).    Cardiovascular: Negative for chest pain or palpitations.  Gastrointestinal: Negative for abdominal pain, no bowel changes.  Musculoskeletal: Negative for gait problem , positive for left knee  joint swelling.  Skin: Negative for rash.   Neurological: Negative for dizziness or headache.  No other specific complaints in a complete review of systems (except as listed in HPI above).  Objective  Filed Vitals:   06/14/15 1010  BP: 136/70  Pulse: 99  Temp: 98.8 F (37.1 C)  TempSrc: Oral  Resp: 16  Height: 5\' 6" (1.676 m)  Weight: 202 lb 1.6 oz (91.672 kg)  SpO2: 96%    Body mass index is 32.64 kg/(m^2).  Physical Exam  Constitutional: Patient appears well-developed and well-nourished. Obese No distress.  HEENT: head atraumatic, normocephalic, pupils equal and reactive to light,  neck supple, throat within normal limits Cardiovascular: Normal rate, regular rhythm and normal heart sounds.  No murmur heard. Trace BLE edema. Pulmonary/Chest: Effort normal and breath sounds normal. No respiratory distress. Abdominal: Soft.  There is no tenderness. Psychiatric: Patient has a normal mood and affect. behavior is normal. Judgment and thought content normal. Muscular Skeletal: crepitus with extension of left knee and effusion, no redness or increase in warmth Skin: two wart like lesion on thighs and a dark raised lesion on left anterior neck ( she wants to contact Dermatologist for biopsy)  Recent Results (from the past 2160 hour(s))  Comprehensive metabolic panel     Status: Abnormal   Collection Time: 04/07/15 11:56 AM  Result Value Ref Range   Glucose 120 (H) 65 - 99 mg/dL   BUN 22 6 - 24 mg/dL   Creatinine, Ser 0.92 0.57 - 1.00 mg/dL   GFR calc non Af Amer 69 >59 mL/min/1.73   GFR calc Af Amer 79 >59 mL/min/1.73   BUN/Creatinine Ratio 24 (H) 9 - 23   Sodium 140 136 - 144 mmol/L    Comment: **Effective April 18, 2015 the reference interval**   for Sodium, Serum will be changing  to:                                             13 4 - 144    Potassium 4.9 3.5 - 5.2 mmol/L   Chloride 99 97 - 106 mmol/L    Comment: **Effective April 18, 2015 the reference interval**   for Chloride, Serum will be changing to:  96 - 106    CO2 27 18 - 29 mmol/L   Calcium 9.5 8.7 - 10.2 mg/dL   Total Protein 6.9 6.0 - 8.5 g/dL   Albumin 4.3 3.5 - 5.5 g/dL   Globulin, Total 2.6 1.5 - 4.5 g/dL   Albumin/Globulin Ratio 1.7 1.1 - 2.5   Bilirubin Total 0.5 0.0 - 1.2 mg/dL   Alkaline Phosphatase 88 39 - 117 IU/L   AST 16 0 - 40 IU/L   ALT 23 0 - 32 IU/L  TSH     Status: None   Collection Time: 04/07/15 11:56 AM  Result Value Ref Range   TSH 4.160 0.450 - 4.500 uIU/mL     PHQ2/9: Depression screen [facility] 2/9 06/14/2015 04/07/2015 11/23/2014  Decreased Interest 0 0 0  Down, Depressed, Hopeless 0 0 0  PHQ - 2 Score 0 0 0     Fall Risk: Fall Risk  06/14/2015 04/07/2015 11/23/2014  Falls in the past year? No No No      Functional Status Survey: Is the patient deaf or have difficulty hearing?: No Does the patient have difficulty seeing, even when wearing glasses/contacts?: No Does the patient have difficulty concentrating, remembering, or making decisions?: No Does the patient have difficulty walking or climbing stairs?: No Does the patient have difficulty dressing or bathing?: No Does the patient have difficulty doing errands alone such as visiting a doctor's office or shopping?: No    Assessment & Plan  1. Benign hypertension  At goal  - Potassium - chlorthalidone (HYGROTON) 25 MG tablet; Take 1 tablet (25 mg total) by mouth daily.  Dispense: 90 tablet; Refill: 1  2. Bilateral edema of lower extremity  Improved since stopped Norvasc and started Chlorthalidone   3. Knee effusion, left  - Ambulatory referral to Orthopedic Surgery  4. Dyslipidemia  - Lipid panel  5. Other specified hypothyroidism  - TSH

## 2015-06-15 LAB — LIPID PANEL
Chol/HDL Ratio: 3 ratio units (ref 0.0–4.4)
Cholesterol, Total: 132 mg/dL (ref 100–199)
HDL: 44 mg/dL (ref >39)
LDL CALC: 72 mg/dL (ref 0–99)
Triglycerides: 82 mg/dL (ref 0–149)
VLDL CHOLESTEROL CAL: 16 mg/dL (ref 5–40)

## 2015-06-15 LAB — POTASSIUM: Potassium: 3.6 mmol/L (ref 3.5–5.2)

## 2015-06-15 LAB — TSH: TSH: 3.6 u[IU]/mL (ref 0.450–4.500)

## 2015-08-19 ENCOUNTER — Other Ambulatory Visit: Payer: Self-pay | Admitting: Family Medicine

## 2015-08-19 NOTE — Telephone Encounter (Signed)
Patient requesting refill. 

## 2015-09-08 ENCOUNTER — Other Ambulatory Visit: Payer: Self-pay | Admitting: Physician Assistant

## 2015-09-08 DIAGNOSIS — M2392 Unspecified internal derangement of left knee: Secondary | ICD-10-CM

## 2015-09-09 DIAGNOSIS — H25013 Cortical age-related cataract, bilateral: Secondary | ICD-10-CM | POA: Diagnosis not present

## 2015-09-27 ENCOUNTER — Ambulatory Visit
Admission: RE | Admit: 2015-09-27 | Discharge: 2015-09-27 | Disposition: A | Discharge status: Home / Self Care | Payer: BLUE CROSS/BLUE SHIELD | Source: Ambulatory Visit | Attending: Physician Assistant | Admitting: Physician Assistant

## 2015-09-27 DIAGNOSIS — M7122 Synovial cyst of popliteal space [Baker], left knee: Secondary | ICD-10-CM | POA: Insufficient documentation

## 2015-09-27 DIAGNOSIS — S83242A Other tear of medial meniscus, current injury, left knee, initial encounter: Secondary | ICD-10-CM | POA: Insufficient documentation

## 2015-09-27 DIAGNOSIS — M25462 Effusion, left knee: Secondary | ICD-10-CM | POA: Insufficient documentation

## 2015-09-27 DIAGNOSIS — S83282A Other tear of lateral meniscus, current injury, left knee, initial encounter: Secondary | ICD-10-CM | POA: Diagnosis not present

## 2015-09-27 DIAGNOSIS — M2392 Unspecified internal derangement of left knee: Secondary | ICD-10-CM | POA: Diagnosis not present

## 2015-09-27 DIAGNOSIS — M2342 Loose body in knee, left knee: Secondary | ICD-10-CM | POA: Diagnosis not present

## 2015-10-14 ENCOUNTER — Encounter: Payer: Self-pay | Admitting: Family Medicine

## 2015-10-14 ENCOUNTER — Ambulatory Visit (INDEPENDENT_AMBULATORY_CARE_PROVIDER_SITE_OTHER): Payer: BLUE CROSS/BLUE SHIELD | Admitting: Family Medicine

## 2015-10-14 VITALS — BP 132/78 | HR 83 | Temp 98.4°F | Resp 18 | Ht 66.0 in | Wt 199.2 lb

## 2015-10-14 DIAGNOSIS — M7122 Synovial cyst of popliteal space [Baker], left knee: Secondary | ICD-10-CM | POA: Diagnosis not present

## 2015-10-14 DIAGNOSIS — K219 Gastro-esophageal reflux disease without esophagitis: Secondary | ICD-10-CM | POA: Diagnosis not present

## 2015-10-14 DIAGNOSIS — I10 Essential (primary) hypertension: Secondary | ICD-10-CM | POA: Diagnosis not present

## 2015-10-14 DIAGNOSIS — S83207A Unspecified tear of unspecified meniscus, current injury, left knee, initial encounter: Secondary | ICD-10-CM | POA: Insufficient documentation

## 2015-10-14 DIAGNOSIS — S83207D Unspecified tear of unspecified meniscus, current injury, left knee, subsequent encounter: Secondary | ICD-10-CM | POA: Diagnosis not present

## 2015-10-14 DIAGNOSIS — R600 Localized edema: Secondary | ICD-10-CM | POA: Diagnosis not present

## 2015-10-14 DIAGNOSIS — R252 Cramp and spasm: Secondary | ICD-10-CM | POA: Diagnosis not present

## 2015-10-14 DIAGNOSIS — G4700 Insomnia, unspecified: Secondary | ICD-10-CM

## 2015-10-14 DIAGNOSIS — M7120 Synovial cyst of popliteal space [Baker], unspecified knee: Secondary | ICD-10-CM | POA: Insufficient documentation

## 2015-10-14 DIAGNOSIS — E038 Other specified hypothyroidism: Secondary | ICD-10-CM

## 2015-10-14 DIAGNOSIS — E785 Hyperlipidemia, unspecified: Secondary | ICD-10-CM

## 2015-10-14 MED ORDER — LEVOTHYROXINE SODIUM 50 MCG PO TABS: 50.0000 ug | ORAL_TABLET | Freq: Every day | ORAL | Status: DC

## 2015-10-14 MED ORDER — CHLORTHALIDONE 25 MG PO TABS: 25.0000 mg | ORAL_TABLET | Freq: Every day | ORAL | Status: DC

## 2015-10-14 MED ORDER — METOPROLOL SUCCINATE ER 50 MG PO TB24: 50.0000 mg | ORAL_TABLET | Freq: Every day | ORAL | Status: DC

## 2015-10-14 MED ORDER — ATORVASTATIN CALCIUM 40 MG PO TABS: 40.0000 mg | ORAL_TABLET | Freq: Every day | ORAL | Status: DC

## 2015-10-14 NOTE — Progress Notes (Signed)
Name: Judy Graves   MRN: [id number]    DOB: 1955-09-25   Date:10/14/2015       Progress Note  Subjective  Chief Complaint  Chief Complaint  Patient presents with  . Medication Refill    4 month F/U   . Hypertension    Edema in bilateral ankles   . Gastroesophageal Reflux    Well controlled with daily use  . Hyperlipidemia    Patient complains of muscle cramps in feet and calves constantly  . Hypothyroidism    Dry skin, hair loss and cold with heat intolerance.    HPI  HTN: she had an angioedema and we had to stop Benazepril, she was taking Norvac 10 mg and 12.5 mg of HCTZ plus Metoprolol ER 50 mg daily, but it caused significant lower extremity edema, therefore we stopped Norvasc and HCTZ in January and she has been taking Chlorthalidone 25 mg and Metoprolol , she has noticed leg cramps past couple of months and also her feet, episodes are sporadic, at most twice weekly, she walks and it resolves.  BP has been at goal. No chest pain, no palpitation.  Left knee pain and effusion: she was seen by  Dr. Marry Guan and have effusion drained, MRI showed medial meniscal tear and waiting to find out the next step.  Hypothyroidism:TSH is stable, having mild dry skin, no longer has constipation, but she has noticed mild hair loss. She has lost a few lbs since last visit  Dyslipidemia: taking Lipitor, denies myalgias, no chest pain. Labs reviewed with patient today  GERD: taking Prevacid, she has occasional  regurgitation, heartburn or epigastric pain   Patient Active Problem List   Diagnosis Date Noted  . Tear of meniscus of left knee 10/14/2015  . Baker's cyst of knee 10/14/2015  . Benign hypertension 11/21/2014  . Chronic constipation 11/21/2014  . Insomnia, persistent 11/21/2014  . Dyslipidemia 11/21/2014  . Gastro-esophageal reflux disease without esophagitis 11/21/2014  . External hemorrhoids 11/21/2014  . Adult hypothyroidism 11/21/2014  . Impingement syndrome of shoulder  11/21/2014  . Menopause 11/21/2014  . Dysmetabolic syndrome 123XX123  . Carpal tunnel syndrome 11/21/2014  . Adult BMI 30+ 11/21/2014  . Allergic rhinitis 11/21/2014  . Primary osteoarthritis of both knees 11/21/2014  . Abnormal presence of protein in urine 11/21/2014  . Lumbar canal stenosis 11/21/2014  . Lump or mass in breast 04/13/2008    Past Surgical History  Procedure Laterality Date  . Tubal ligation    . Tonsillectomy    . Carpal tunnel release Right 2010    Dr. Sabra Heck    Family History  Problem Relation Age of Onset  . Cancer Mother     Breast  . Diabetes Mother   . Hypertension Father   . CVA Father     35's  . Hypertension Sister   . Hypertension Brother     Social History   Social History  . Marital Status: Married    Spouse Name: N/A  . Number of Children: N/A  . Years of Education: N/A   Occupational History  . Not on file.   Social History Main Topics  . Smoking status: Never Smoker   . Smokeless tobacco: Never Used  . Alcohol Use: No  . Drug Use: No  . Sexual Activity:    Partners: Male   Other Topics Concern  . Not on file   Social History Narrative     Current outpatient prescriptions:  .  aspirin 81 MG chewable  tablet, Chew 1 tablet by mouth daily., Disp: , Rfl:  .  atorvastatin (LIPITOR) 40 MG tablet, Take 1 tablet (40 mg total) by mouth daily., Disp: 30 tablet, Rfl: 3 .  chlorthalidone (HYGROTON) 25 MG tablet, Take 1 tablet (25 mg total) by mouth daily., Disp: 30 tablet, Rfl: 3 .  EPIPEN 2-PAK 0.3 MG/0.3ML SOAJ injection, AS DIRECTED AS DIRECTED INJECTION 2, Disp: , Rfl: 1 .  lansoprazole (PREVACID) 30 MG capsule, TAKE 1 CAPSULE (30 MG TOTAL) BY MOUTH 2 (TWO) TIMES DAILY., Disp: 90 capsule, Rfl: 3 .  levothyroxine (SYNTHROID, LEVOTHROID) 50 MCG tablet, Take 1 tablet (50 mcg total) by mouth daily. And 2 on Sundays, Disp: 30 tablet, Rfl: 3 .  metoprolol succinate (TOPROL-XL) 50 MG 24 hr tablet, Take 1 tablet (50 mg total) by mouth  daily., Disp: 30 tablet, Rfl: 3  Allergies  Allergen Reactions  . Ace Inhibitors Swelling     ROS  Constitutional: Negative for fever, positive for mild  weight change.  Respiratory: Negative for cough and shortness of breath.   Cardiovascular: Negative for chest pain or palpitations.  Gastrointestinal: Negative for abdominal pain, no bowel changes.  Musculoskeletal: Positive  for gait problem or joint swelling.  Skin: Negative for rash.  Neurological: Negative for dizziness or headache.  No other specific complaints in a complete review of systems (except as listed in HPI above).   Objective  Filed Vitals:   10/14/15 1118  BP: 132/78  Pulse: 83  Temp: 98.4 F (36.9 C)  TempSrc: Oral  Resp: 18  Height: 5\' 6"  (1.676 m)  Weight: 199 lb 3.2 oz (90.357 kg)  SpO2: 94%    Body mass index is 32.17 kg/(m^2).  Physical Exam  Constitutional: Patient appears well-developed and well-nourished. Obese  No distress.  HEENT: head atraumatic, normocephalic, pupils equal and reactive to light neck supple, throat within normal limits Cardiovascular: Normal rate, regular rhythm and normal heart sounds.  No murmur heard. No BLE edema. Pulmonary/Chest: Effort normal and breath sounds normal. No respiratory distress. Abdominal: Soft.  There is no tenderness. Psychiatric: Patient has a normal mood and affect. behavior is normal. Judgment and thought content normal. Muscular Skeletal mild effusion of left knee, good rom    PHQ2/9: Depression screen [facility] 2/9 10/14/2015 06/14/2015 04/07/2015 11/23/2014  Decreased Interest 0 0 0 0  Down, Depressed, Hopeless 0 0 0 0  PHQ - 2 Score 0 0 0 0     Fall Risk: Fall Risk  10/14/2015 06/14/2015 04/07/2015 11/23/2014  Falls in the past year? No No No No     Functional Status Survey: Is the patient deaf or have difficulty hearing?: No Does the patient have difficulty seeing, even when wearing glasses/contacts?: No Does the patient have difficulty  concentrating, remembering, or making decisions?: No Does the patient have difficulty walking or climbing stairs?: No Does the patient have difficulty dressing or bathing?: No Does the patient have difficulty doing errands alone such as visiting a doctor's office or shopping?: No    Assessment & Plan  1. Benign hypertension  - metoprolol succinate (TOPROL-XL) 50 MG 24 hr tablet; Take 1 tablet (50 mg total) by mouth daily.  Dispense: 30 tablet; Refill: 3 - chlorthalidone (HYGROTON) 25 MG tablet; Take 1 tablet (25 mg total) by mouth daily.  Dispense: 30 tablet; Refill: 3 - CBC with Differential/Platelet - Comprehensive metabolic panel  2. Other specified hypothyroidism  - levothyroxine (SYNTHROID, LEVOTHROID) 50 MCG tablet; Take 1 tablet (50 mcg total) by mouth  daily. And 2 on Sundays  Dispense: 30 tablet; Refill: 3 - TSH  3. Bilateral edema of lower extremity  Doing well, on diuretic   4. Insomnia, persistent  She states works 2nd shift, sleeps for about 6 hours, does not want to have sleep study, she does not want medication for sleep   5. Gastro-esophageal reflux disease without esophagitis  Continue medication   6. Dyslipidemia  - atorvastatin (LIPITOR) 40 MG tablet; Take 1 tablet (40 mg total) by mouth daily.  Dispense: 30 tablet; Refill: 3  7. Cramp of both lower extremities  - CBC with Differential/Platelet - Comprehensive metabolic panel - Magnesium  8. Tear of meniscus of left knee, subsequent encounter  Continue follow up with Dr. Jovita Kussmaul  9. Baker's cyst of knee, left

## 2015-10-15 LAB — CBC WITH DIFFERENTIAL/PLATELET
BASOS: 0 %
Basophils Absolute: 0 10*3/uL (ref 0.0–0.2)
EOS (ABSOLUTE): 0.1 10*3/uL (ref 0.0–0.4)
EOS: 1 %
HEMATOCRIT: 41.2 % (ref 34.0–46.6)
Hemoglobin: 14.8 g/dL (ref 11.1–15.9)
IMMATURE GRANULOCYTES: 0 %
Immature Grans (Abs): 0 10*3/uL (ref 0.0–0.1)
LYMPHS ABS: 3.3 10*3/uL — AB (ref 0.7–3.1)
Lymphs: 39 %
MCH: 31.5 pg (ref 26.6–33.0)
MCHC: 35.9 g/dL — ABNORMAL HIGH (ref 31.5–35.7)
MCV: 88 fL (ref 79–97)
MONOS ABS: 0.8 10*3/uL (ref 0.1–0.9)
Monocytes: 10 %
NEUTROS ABS: 4.1 10*3/uL (ref 1.4–7.0)
NEUTROS PCT: 50 %
Platelets: 222 10*3/uL (ref 150–379)
RBC: 4.7 x10E6/uL (ref 3.77–5.28)
RDW: 13.4 % (ref 12.3–15.4)
WBC: 8.3 10*3/uL (ref 3.4–10.8)

## 2015-10-15 LAB — COMPREHENSIVE METABOLIC PANEL
A/G RATIO: 1.3 (ref 1.2–2.2)
ALT: 25 IU/L (ref 0–32)
AST: 16 IU/L (ref 0–40)
Albumin: 4.1 g/dL (ref 3.5–5.5)
Alkaline Phosphatase: 82 IU/L (ref 39–117)
BUN/Creatinine Ratio: 19 (ref 9–23)
BUN: 17 mg/dL (ref 6–24)
Bilirubin Total: 0.5 mg/dL (ref 0.0–1.2)
CALCIUM: 9.9 mg/dL (ref 8.7–10.2)
CO2: 28 mmol/L (ref 18–29)
Chloride: 97 mmol/L (ref 96–106)
Creatinine, Ser: 0.89 mg/dL (ref 0.57–1.00)
GFR, EST AFRICAN AMERICAN: 82 mL/min/{1.73_m2} (ref >59)
GFR, EST NON AFRICAN AMERICAN: 71 mL/min/{1.73_m2} (ref >59)
GLOBULIN, TOTAL: 3.1 g/dL (ref 1.5–4.5)
Glucose: 117 mg/dL — ABNORMAL HIGH (ref 65–99)
POTASSIUM: 4.2 mmol/L (ref 3.5–5.2)
SODIUM: 142 mmol/L (ref 134–144)
TOTAL PROTEIN: 7.2 g/dL (ref 6.0–8.5)

## 2015-10-15 LAB — MAGNESIUM: Magnesium: 1.9 mg/dL (ref 1.6–2.3)

## 2015-10-15 LAB — TSH: TSH: 2.3 u[IU]/mL (ref 0.450–4.500)

## 2015-10-17 ENCOUNTER — Telehealth: Payer: Self-pay

## 2015-10-17 DIAGNOSIS — H25013 Cortical age-related cataract, bilateral: Secondary | ICD-10-CM | POA: Diagnosis not present

## 2015-10-17 NOTE — Telephone Encounter (Signed)
-----   Message from Steele Sizer, MD sent at 10/16/2015  3:57 PM EDT ----- Normal CBC Glucose is slightly elevated secondary to pre-diabetes ( metabolic syndrome) but normal kidney and liver function test Normal TSH, magnesium

## 2015-10-17 NOTE — Telephone Encounter (Signed)
Left message for patient to return our call regarding labs.

## 2015-10-20 DIAGNOSIS — M2392 Unspecified internal derangement of left knee: Secondary | ICD-10-CM | POA: Diagnosis not present

## 2015-10-27 DIAGNOSIS — H25013 Cortical age-related cataract, bilateral: Secondary | ICD-10-CM | POA: Diagnosis not present

## 2015-10-31 NOTE — Discharge Instructions (Signed)

## 2015-11-02 ENCOUNTER — Encounter
Admission: RE | Disposition: A | Discharge status: Home / Self Care | Payer: Self-pay | Source: Ambulatory Visit | Attending: Ophthalmology

## 2015-11-02 ENCOUNTER — Ambulatory Visit: Payer: BLUE CROSS/BLUE SHIELD | Admitting: Anesthesiology

## 2015-11-02 ENCOUNTER — Ambulatory Visit
Admission: RE | Admit: 2015-11-02 | Discharge: 2015-11-02 | Disposition: A | Discharge status: Home / Self Care | Payer: BLUE CROSS/BLUE SHIELD | Source: Ambulatory Visit | Attending: Ophthalmology | Admitting: Ophthalmology

## 2015-11-02 DIAGNOSIS — Z7982 Long term (current) use of aspirin: Secondary | ICD-10-CM | POA: Diagnosis not present

## 2015-11-02 DIAGNOSIS — K219 Gastro-esophageal reflux disease without esophagitis: Secondary | ICD-10-CM | POA: Diagnosis not present

## 2015-11-02 DIAGNOSIS — Z9851 Tubal ligation status: Secondary | ICD-10-CM | POA: Insufficient documentation

## 2015-11-02 DIAGNOSIS — Z79899 Other long term (current) drug therapy: Secondary | ICD-10-CM | POA: Diagnosis not present

## 2015-11-02 DIAGNOSIS — M1990 Unspecified osteoarthritis, unspecified site: Secondary | ICD-10-CM | POA: Insufficient documentation

## 2015-11-02 DIAGNOSIS — H25013 Cortical age-related cataract, bilateral: Secondary | ICD-10-CM | POA: Diagnosis not present

## 2015-11-02 DIAGNOSIS — H2512 Age-related nuclear cataract, left eye: Secondary | ICD-10-CM | POA: Diagnosis not present

## 2015-11-02 DIAGNOSIS — E7800 Pure hypercholesterolemia, unspecified: Secondary | ICD-10-CM | POA: Insufficient documentation

## 2015-11-02 DIAGNOSIS — I10 Essential (primary) hypertension: Secondary | ICD-10-CM | POA: Diagnosis not present

## 2015-11-02 DIAGNOSIS — E039 Hypothyroidism, unspecified: Secondary | ICD-10-CM | POA: Diagnosis not present

## 2015-11-02 DIAGNOSIS — H25012 Cortical age-related cataract, left eye: Secondary | ICD-10-CM | POA: Diagnosis not present

## 2015-11-02 HISTORY — PX: CATARACT EXTRACTION W/PHACO: SHX586

## 2015-11-02 HISTORY — DX: Unspecified osteoarthritis, unspecified site: M19.90

## 2015-11-02 SURGERY — PHACOEMULSIFICATION, CATARACT, WITH IOL INSERTION
Anesthesia: Monitor Anesthesia Care | Site: Eye | Laterality: Left | Wound class: Clean

## 2015-11-02 MED ORDER — MIDAZOLAM HCL 2 MG/2ML IJ SOLN
INTRAMUSCULAR | Status: DC | PRN
  Administered 2015-11-02: 2 mg via INTRAVENOUS

## 2015-11-02 MED ORDER — BSS IO SOLN
INTRAOCULAR | Status: DC | PRN
  Administered 2015-11-02: 46 mL via OPHTHALMIC

## 2015-11-02 MED ORDER — ACETAMINOPHEN 160 MG/5ML PO SOLN: 325.0000 mg | ORAL | Status: DC | PRN

## 2015-11-02 MED ORDER — BRIMONIDINE TARTRATE 0.2 % OP SOLN
OPHTHALMIC | Status: DC | PRN
  Administered 2015-11-02: 1 [drp] via OPHTHALMIC

## 2015-11-02 MED ORDER — NA HYALUR & NA CHOND-NA HYALUR 0.4-0.35 ML IO KIT
PACK | INTRAOCULAR | Status: DC | PRN
  Administered 2015-11-02: 1 mL via INTRAOCULAR

## 2015-11-02 MED ORDER — ARMC OPHTHALMIC DILATING GEL
1.0000 "application " | OPHTHALMIC | Status: DC | PRN
  Administered 2015-11-02 (×2): 1 via OPHTHALMIC

## 2015-11-02 MED ORDER — FENTANYL CITRATE (PF) 100 MCG/2ML IJ SOLN
INTRAMUSCULAR | Status: DC | PRN
  Administered 2015-11-02: 50 ug via INTRAVENOUS

## 2015-11-02 MED ORDER — TETRACAINE HCL 0.5 % OP SOLN
1.0000 [drp] | OPHTHALMIC | Status: DC | PRN
  Administered 2015-11-02: 1 [drp] via OPHTHALMIC

## 2015-11-02 MED ORDER — POVIDONE-IODINE 5 % OP SOLN
1.0000 | OPHTHALMIC | Status: DC | PRN
Start: 2015-11-02 — End: 2015-11-02
  Administered 2015-11-02: 1 via OPHTHALMIC

## 2015-11-02 MED ORDER — CEFUROXIME OPHTHALMIC INJECTION 1 MG/0.1 ML
INJECTION | OPHTHALMIC | Status: DC | PRN
  Administered 2015-11-02: 0.1 mL via INTRACAMERAL

## 2015-11-02 MED ORDER — TIMOLOL MALEATE 0.5 % OP SOLN
OPHTHALMIC | Status: DC | PRN
  Administered 2015-11-02: 1 [drp] via OPHTHALMIC

## 2015-11-02 MED ORDER — LACTATED RINGERS IV SOLN: INTRAVENOUS | Status: DC

## 2015-11-02 MED ORDER — LIDOCAINE HCL (PF) 4 % IJ SOLN
INTRAOCULAR | Status: DC | PRN
  Administered 2015-11-02: 1 mL via OPHTHALMIC

## 2015-11-02 MED ORDER — ACETAMINOPHEN 325 MG PO TABS: 325.0000 mg | ORAL_TABLET | ORAL | Status: DC | PRN

## 2015-11-02 SURGICAL SUPPLY — 25 items
CANNULA ANT/CHMB 27GA (MISCELLANEOUS) ×3 IMPLANT
CARTRIDGE ABBOTT (MISCELLANEOUS) IMPLANT
GLOVE SURG LX 7.5 STRW (GLOVE) ×2
GLOVE SURG LX STRL 7.5 STRW (GLOVE) ×1 IMPLANT
GLOVE SURG TRIUMPH 8.0 PF LTX (GLOVE) ×3 IMPLANT
GOWN STRL REUS W/ TWL LRG LVL3 (GOWN DISPOSABLE) ×2 IMPLANT
GOWN STRL REUS W/TWL LRG LVL3 (GOWN DISPOSABLE) ×4
LENS IOL TECNIS ITEC 20.5 (Intraocular Lens) ×3 IMPLANT
MARKER SKIN DUAL TIP RULER LAB (MISCELLANEOUS) ×3 IMPLANT
NDL RETROBULBAR .5 NSTRL (NEEDLE) IMPLANT
NEEDLE FILTER BLUNT 18X 1/2SAF (NEEDLE) ×2
NEEDLE FILTER BLUNT 18X1 1/2 (NEEDLE) ×1 IMPLANT
PACK CATARACT BRASINGTON (MISCELLANEOUS) ×3 IMPLANT
PACK EYE AFTER SURG (MISCELLANEOUS) ×3 IMPLANT
PACK OPTHALMIC (MISCELLANEOUS) ×3 IMPLANT
RING MALYGIN 7.0 (MISCELLANEOUS) IMPLANT
SUT ETHILON 10-0 CS-B-6CS-B-6 (SUTURE)
SUT VICRYL  9 0 (SUTURE)
SUT VICRYL 9 0 (SUTURE) IMPLANT
SUTURE EHLN 10-0 CS-B-6CS-B-6 (SUTURE) IMPLANT
SYR 3ML LL SCALE MARK (SYRINGE) ×3 IMPLANT
SYR 5ML LL (SYRINGE) ×3 IMPLANT
SYR TB 1ML LUER SLIP (SYRINGE) ×3 IMPLANT
WATER STERILE IRR 250ML POUR (IV SOLUTION) ×3 IMPLANT
WIPE NON LINTING 3.25X3.25 (MISCELLANEOUS) ×3 IMPLANT

## 2015-11-02 NOTE — H&P (Signed)
  The History and Physical notes are on paper, have been signed, and are to be scanned. The patient remains stable and unchanged from the H&P.   Previous H&P reviewed, patient examined, and there are no changes.  Kayle Passarelli 11/02/2015 9:40 AM

## 2015-11-02 NOTE — Op Note (Signed)
OPERATIVE NOTE  AZARIE MARGOLIS [id number] 11/02/2015   PREOPERATIVE DIAGNOSIS:  Nuclear sclerotic cataract left eye. H25.12   POSTOPERATIVE DIAGNOSIS:    Nuclear sclerotic cataract left eye.     PROCEDURE:  Phacoemusification with posterior chamber intraocular lens placement of the left eye   LENS:   Implant Name Type Inv. Item Serial No. Manufacturer Lot No. LRB No. Used  LENS IOL DIOP 20.5 - [id number] Intraocular Lens LENS IOL DIOP 20.5 [id number]   Left 1        ULTRASOUND TIME: 12  % of 0 minutes 36 seconds, CDE 4.4  SURGEON:  Wyonia Hough, MD   ANESTHESIA:  Topical with tetracaine drops and 2% Xylocaine jelly, augmented with 1% preservative-free intracameral lidocaine.    COMPLICATIONS:  None.   DESCRIPTION OF PROCEDURE:  The patient was identified in the holding room and transported to the operating room and placed in the supine position under the operating microscope.  The left eye was identified as the operative eye and it was prepped and draped in the usual sterile ophthalmic fashion.   A 1 millimeter clear-corneal paracentesis was made at the 1:30 position.  0.5 ml of preservative-free 1% lidocaine was injected into the anterior chamber.  The anterior chamber was filled with Viscoat viscoelastic.  A 2.4 millimeter keratome was used to make a near-clear corneal incision at the 10:30 position.  .  A curvilinear capsulorrhexis was made with a cystotome and capsulorrhexis forceps.  Balanced salt solution was used to hydrodissect and hydrodelineate the nucleus.   Phacoemulsification was then used in stop and chop fashion to remove the lens nucleus and epinucleus.  The remaining cortex was then removed using the irrigation and aspiration handpiece. Provisc was then placed into the capsular bag to distend it for lens placement.  A lens was then injected into the capsular bag.  The remaining viscoelastic was aspirated.   Wounds were hydrated with balanced salt  solution.  The anterior chamber was inflated to a physiologic pressure with balanced salt solution.  No wound leaks were noted. Cefuroxime 0.1 ml of a 10mg /ml solution was injected into the anterior chamber for a dose of 1 mg of intracameral antibiotic at the completion of the case.   Timolol and Brimonidine drops were applied to the eye.  The patient was taken to the recovery room in stable condition without complications of anesthesia or surgery.  Izaya Netherton 11/02/2015, 10:39 AM

## 2015-11-02 NOTE — Anesthesia Postprocedure Evaluation (Signed)
Anesthesia Post Note  Patient: Judy Graves  Procedure(s) Performed: Procedure(s) (LRB): CATARACT EXTRACTION PHACO AND INTRAOCULAR LENS PLACEMENT ([place]) left eye (Left)  Patient location during evaluation: PACU Anesthesia Type: MAC Level of consciousness: awake and alert and oriented Pain management: satisfactory to patient Vital Signs Assessment: post-procedure vital signs reviewed and stable Respiratory status: spontaneous breathing, nonlabored ventilation and respiratory function stable Cardiovascular status: blood pressure returned to baseline and stable Postop Assessment: Adequate PO intake and No signs of nausea or vomiting Anesthetic complications: no    Raliegh Ip

## 2015-11-02 NOTE — Transfer of Care (Signed)
Immediate Anesthesia Transfer of Care Note  Patient: Judy Graves  Procedure(s) Performed: Procedure(s): CATARACT EXTRACTION PHACO AND INTRAOCULAR LENS PLACEMENT ([facility]) left eye (Left)  Patient Location: PACU  Anesthesia Type: MAC  Level of Consciousness: awake, alert  and patient cooperative  Airway and Oxygen Therapy: Patient Spontanous Breathing and Patient connected to supplemental oxygen  Post-op Assessment: Post-op Vital signs reviewed, Patient's Cardiovascular Status Stable, Respiratory Function Stable, Patent Airway and No signs of Nausea or vomiting  Post-op Vital Signs: Reviewed and stable  Complications: No apparent anesthesia complications

## 2015-11-02 NOTE — Anesthesia Preprocedure Evaluation (Signed)
Anesthesia Evaluation  Patient identified by MRN, date of birth, ID band  Reviewed: Allergy & Precautions, H&P , NPO status , Patient's Chart, lab work & pertinent test results  Airway Mallampati: III  TM Distance: >3 FB Neck ROM: full    Dental no notable dental hx.    Pulmonary    Pulmonary exam normal        Cardiovascular hypertension,  Rhythm:regular Rate:Normal     Neuro/Psych    GI/Hepatic GERD  ,  Endo/Other  Hypothyroidism   Renal/GU      Musculoskeletal   Abdominal   Peds  Hematology   Anesthesia Other Findings   Reproductive/Obstetrics                             Anesthesia Physical Anesthesia Plan  ASA: II  Anesthesia Plan: MAC   Post-op Pain Management:    Induction:   Airway Management Planned:   Additional Equipment:   Intra-op Plan:   Post-operative Plan:   Informed Consent: I have reviewed the patients History and Physical, chart, labs and discussed the procedure including the risks, benefits and alternatives for the proposed anesthesia with the patient or authorized representative who has indicated his/her understanding and acceptance.     Plan Discussed with: CRNA  Anesthesia Plan Comments:         Anesthesia Quick Evaluation

## 2015-11-03 ENCOUNTER — Encounter: Payer: Self-pay | Admitting: Ophthalmology

## 2015-11-07 ENCOUNTER — Other Ambulatory Visit: Payer: BLUE CROSS/BLUE SHIELD

## 2015-11-07 ENCOUNTER — Encounter: Payer: Self-pay | Admitting: *Deleted

## 2015-11-07 DIAGNOSIS — I10 Essential (primary) hypertension: Secondary | ICD-10-CM | POA: Diagnosis not present

## 2015-11-07 NOTE — Patient Instructions (Signed)
  Your procedure is scheduled on: 11/14/15 Report to Day Surgery.[facility]OND FLOOR To find out your arrival time please call [phone] between 1PM - 3PM on7/7/17.  Remember: Instructions that are not followed completely may result in serious medical risk, up to and including death, or upon the discretion of your surgeon and anesthesiologist your surgery may need to be rescheduled.    _X___ 1. Do not eat food or drink liquids after midnight. No gum chewing or hard candies.     _X___ 2. No Alcohol for 24 hours before or after surgery.   __X__ 3. Do Not Smoke For 24 Hours Prior to Your Surgery.   ____ 4. Bring all medications with you on the day of surgery if instructed.    __X__ 5. Notify your doctor if there is any change in your medical condition     (cold, fever, infections).       Do not wear jewelry, make-up, hairpins, clips or nail polish.  Do not wear lotions, powders, or perfumes. You may wear deodorant.  Do not shave 48 hours prior to surgery. Men may shave face and neck.  Do not bring valuables to the hospital.    [facility] is not responsible for any belongings or valuables.               Contacts, dentures or bridgework may not be worn into surgery.  Leave your suitcase in the car. After surgery it may be brought to your room.  For patients admitted to the hospital, discharge time is determined by your                treatment team.   Patients discharged the day of surgery will not be allowed to drive home.   Please read over the following fact sheets that you were given:   Surgical Site Infection Prevention   _X__ Take these medicines the morning of surgery with A SIP OF WATER:    1.LEVOTHYROXINE  2. PREVACID  3. METOPROLOL  4. ATORVASTATIN  5.  6.  ____ Fleet Enema (as directed)   _X___ Use CHG Soap as directed  ____ Use inhalers on the day of surgery  ____ Stop metformin 2 days prior to surgery    ____ Take 1/2 of usual insulin dose the  night before surgery and none on the morning of surgery.   _X___ Stop Coumadin/Plavix/aspirin on    STOP ASPIRIN UNTIL AFTER SURGERY  ____ Stop Anti-inflammatories on   ____ Stop supplements until after surgery.    ____ Bring C-Pap to the hospital.

## 2015-11-09 ENCOUNTER — Encounter
Admission: RE | Admit: 2015-11-09 | Discharge: 2015-11-09 | Disposition: A | Discharge status: Home / Self Care | Payer: BLUE CROSS/BLUE SHIELD | Source: Ambulatory Visit | Attending: Orthopedic Surgery | Admitting: Orthopedic Surgery

## 2015-11-09 DIAGNOSIS — Z01810 Encounter for preprocedural cardiovascular examination: Secondary | ICD-10-CM | POA: Insufficient documentation

## 2015-11-09 DIAGNOSIS — Z01812 Encounter for preprocedural laboratory examination: Secondary | ICD-10-CM | POA: Diagnosis not present

## 2015-11-09 LAB — POTASSIUM: Potassium: 3.3 mmol/L — ABNORMAL LOW (ref 3.5–5.1)

## 2015-11-11 ENCOUNTER — Telehealth: Payer: Self-pay | Admitting: Family Medicine

## 2015-11-11 MED ORDER — POTASSIUM CHLORIDE CRYS ER 20 MEQ PO TBCR: 20.0000 meq | EXTENDED_RELEASE_TABLET | Freq: Every day | ORAL | Status: DC

## 2015-11-11 NOTE — Telephone Encounter (Signed)
Patient will be having surgery on this coming Monday and was told by Dr Marry Guan that her potassium is low and they are asking that you prescribe the prescription. Please send to [facility]. Patient is asking that you please take care of this today before you leave. Dr Marry Guan office will also be contacting you.  Margaretha Sheffield from Dr Marry Guan office [facility]) called while I was in the process of typing this message. She states patient is having surgery on Monday and patient potassium is 3.3. They are asking for potassium therapy or supplement through weekend for patient and they will recheck it on Monday before procedure. Margaretha Sheffield is asking that you please call the patient to inform what you are going to do, she is also asking that you give her call as well. Margaretha Sheffield #: [phone]

## 2015-11-11 NOTE — Telephone Encounter (Signed)
done

## 2015-11-14 ENCOUNTER — Ambulatory Visit: Payer: BLUE CROSS/BLUE SHIELD | Admitting: Anesthesiology

## 2015-11-14 ENCOUNTER — Ambulatory Visit
Admission: RE | Admit: 2015-11-14 | Discharge: 2015-11-14 | Disposition: A | Discharge status: Home / Self Care | Payer: BLUE CROSS/BLUE SHIELD | Source: Ambulatory Visit | Attending: Orthopedic Surgery | Admitting: Orthopedic Surgery

## 2015-11-14 ENCOUNTER — Encounter
Admission: RE | Disposition: A | Discharge status: Home / Self Care | Payer: Self-pay | Source: Ambulatory Visit | Attending: Orthopedic Surgery

## 2015-11-14 ENCOUNTER — Encounter: Payer: Self-pay | Admitting: Orthopedic Surgery

## 2015-11-14 DIAGNOSIS — S83242A Other tear of medial meniscus, current injury, left knee, initial encounter: Secondary | ICD-10-CM | POA: Diagnosis not present

## 2015-11-14 DIAGNOSIS — M23242 Derangement of anterior horn of lateral meniscus due to old tear or injury, left knee: Secondary | ICD-10-CM | POA: Diagnosis not present

## 2015-11-14 DIAGNOSIS — S83282A Other tear of lateral meniscus, current injury, left knee, initial encounter: Secondary | ICD-10-CM | POA: Insufficient documentation

## 2015-11-14 DIAGNOSIS — E039 Hypothyroidism, unspecified: Secondary | ICD-10-CM | POA: Diagnosis not present

## 2015-11-14 DIAGNOSIS — M2392 Unspecified internal derangement of left knee: Secondary | ICD-10-CM | POA: Diagnosis present

## 2015-11-14 DIAGNOSIS — I10 Essential (primary) hypertension: Secondary | ICD-10-CM | POA: Diagnosis not present

## 2015-11-14 DIAGNOSIS — K219 Gastro-esophageal reflux disease without esophagitis: Secondary | ICD-10-CM | POA: Insufficient documentation

## 2015-11-14 DIAGNOSIS — M94262 Chondromalacia, left knee: Secondary | ICD-10-CM | POA: Insufficient documentation

## 2015-11-14 DIAGNOSIS — M23222 Derangement of posterior horn of medial meniscus due to old tear or injury, left knee: Secondary | ICD-10-CM | POA: Diagnosis not present

## 2015-11-14 HISTORY — PX: CHONDROPLASTY: SHX5177

## 2015-11-14 HISTORY — PX: KNEE ARTHROSCOPY WITH LATERAL MENISECTOMY: SHX6193

## 2015-11-14 HISTORY — PX: KNEE ARTHROSCOPY WITH MEDIAL MENISECTOMY: SHX5651

## 2015-11-14 LAB — POCT I-STAT 4, (NA,K, GLUC, HGB,HCT)
GLUCOSE: 124 mg/dL — AB (ref 65–99)
HCT: 37 % (ref 36.0–46.0)
HEMOGLOBIN: 12.6 g/dL (ref 12.0–15.0)
POTASSIUM: 3.9 mmol/L (ref 3.5–5.1)
SODIUM: 144 mmol/L (ref 135–145)

## 2015-11-14 SURGERY — CHONDROPLASTY
Anesthesia: General | Site: Knee | Laterality: Left | Wound class: Clean

## 2015-11-14 MED ORDER — ONDANSETRON HCL 4 MG/2ML IJ SOLN
INTRAMUSCULAR | Status: DC | PRN
  Administered 2015-11-14: 4 mg via INTRAVENOUS

## 2015-11-14 MED ORDER — ONDANSETRON HCL 4 MG/2ML IJ SOLN: 4.0000 mg | Freq: Once | INTRAMUSCULAR | Status: DC | PRN

## 2015-11-14 MED ORDER — FENTANYL CITRATE (PF) 100 MCG/2ML IJ SOLN
25.0000 ug | INTRAMUSCULAR | Status: DC | PRN
  Administered 2015-11-14 (×4): 25 ug via INTRAVENOUS

## 2015-11-14 MED ORDER — ACETAMINOPHEN 10 MG/ML IV SOLN
INTRAVENOUS | Status: DC | PRN
  Administered 2015-11-14: 1000 mg via INTRAVENOUS

## 2015-11-14 MED ORDER — MORPHINE SULFATE (PF) 4 MG/ML IV SOLN
INTRAVENOUS | Status: AC
  Filled 2015-11-14: qty 1

## 2015-11-14 MED ORDER — BUPIVACAINE-EPINEPHRINE 0.25% -1:200000 IJ SOLN
INTRAMUSCULAR | Status: DC | PRN
  Administered 2015-11-14: 5 mL
  Administered 2015-11-14: 25 mL

## 2015-11-14 MED ORDER — KETAMINE HCL 50 MG/ML IJ SOLN
INTRAMUSCULAR | Status: DC | PRN
  Administered 2015-11-14: 10 mg via INTRAVENOUS
  Administered 2015-11-14: 30 mg via INTRAMUSCULAR

## 2015-11-14 MED ORDER — HYDROCODONE-ACETAMINOPHEN 5-325 MG PO TABS: 1.0000 | ORAL_TABLET | ORAL | Status: DC | PRN

## 2015-11-14 MED ORDER — FENTANYL CITRATE (PF) 100 MCG/2ML IJ SOLN
INTRAMUSCULAR | Status: DC | PRN
  Administered 2015-11-14: 25 ug via INTRAVENOUS
  Administered 2015-11-14: 100 ug via INTRAVENOUS
  Administered 2015-11-14 (×4): 25 ug via INTRAVENOUS

## 2015-11-14 MED ORDER — LACTATED RINGERS IV SOLN
INTRAVENOUS | Status: DC
  Administered 2015-11-14 (×2): via INTRAVENOUS

## 2015-11-14 MED ORDER — PROPOFOL 10 MG/ML IV BOLUS
INTRAVENOUS | Status: DC | PRN
  Administered 2015-11-14: 100 mg via INTRAVENOUS

## 2015-11-14 MED ORDER — FENTANYL CITRATE (PF) 100 MCG/2ML IJ SOLN
INTRAMUSCULAR | Status: AC
  Administered 2015-11-14: 25 ug via INTRAVENOUS
  Filled 2015-11-14: qty 2

## 2015-11-14 MED ORDER — MORPHINE SULFATE (PF) 4 MG/ML IV SOLN
INTRAVENOUS | Status: DC | PRN
  Administered 2015-11-14: 4 mg

## 2015-11-14 MED ORDER — CHLORHEXIDINE GLUCONATE 4 % EX LIQD: 60.0000 mL | Freq: Once | CUTANEOUS | Status: DC

## 2015-11-14 MED ORDER — MIDAZOLAM HCL 2 MG/2ML IJ SOLN
INTRAMUSCULAR | Status: DC | PRN
  Administered 2015-11-14: 2 mg via INTRAVENOUS

## 2015-11-14 MED ORDER — BUPIVACAINE-EPINEPHRINE (PF) 0.25% -1:200000 IJ SOLN
INTRAMUSCULAR | Status: AC
Start: 2015-11-14 — End: 2015-11-14
  Filled 2015-11-14: qty 30

## 2015-11-14 MED ORDER — GLYCOPYRROLATE 0.2 MG/ML IJ SOLN
INTRAMUSCULAR | Status: DC | PRN
  Administered 2015-11-14: 0.2 mg via INTRAVENOUS

## 2015-11-14 MED ORDER — ACETAMINOPHEN 10 MG/ML IV SOLN
INTRAVENOUS | Status: AC
  Filled 2015-11-14: qty 100

## 2015-11-14 SURGICAL SUPPLY — 23 items
BLADE SHAVER 4.5 DBL SERAT CV (CUTTER) ×4 IMPLANT
BNDG ESMARK 6X12 TAN STRL LF (GAUZE/BANDAGES/DRESSINGS) ×4 IMPLANT
CUFF TOURN 24 STER (MISCELLANEOUS) ×4 IMPLANT
CUFF TOURN 30 STER DUAL PORT (MISCELLANEOUS) IMPLANT
DRSG DERMACEA 8X12 NADH (GAUZE/BANDAGES/DRESSINGS) ×4 IMPLANT
DURAPREP 26ML APPLICATOR (WOUND CARE) ×8 IMPLANT
GAUZE SPONGE 4X4 12PLY STRL (GAUZE/BANDAGES/DRESSINGS) ×4 IMPLANT
GLOVE BIOGEL M STRL SZ7.5 (GLOVE) ×4 IMPLANT
GLOVE INDICATOR 8.0 STRL GRN (GLOVE) ×4 IMPLANT
GOWN STRL REUS W/ TWL LRG LVL3 (GOWN DISPOSABLE) ×4 IMPLANT
GOWN STRL REUS W/TWL LRG LVL3 (GOWN DISPOSABLE) ×4
IV LACTATED RINGER IRRG 3000ML (IV SOLUTION) ×14
IV LR IRRIG 3000ML ARTHROMATIC (IV SOLUTION) ×14 IMPLANT
KIT RM TURNOVER STRD PROC AR (KITS) ×4 IMPLANT
MANIFOLD NEPTUNE II (INSTRUMENTS) ×4 IMPLANT
PACK ARTHROSCOPY KNEE (MISCELLANEOUS) ×4 IMPLANT
SET TUBE SUCT SHAVER OUTFL 24K (TUBING) ×4 IMPLANT
SET TUBE TIP INTRA-ARTICULAR (MISCELLANEOUS) ×4 IMPLANT
SUT ETHILON 3-0 FS-10 30 BLK (SUTURE) ×4
SUTURE EHLN 3-0 FS-10 30 BLK (SUTURE) ×2 IMPLANT
TUBING ARTHRO INFLOW-ONLY STRL (TUBING) ×4 IMPLANT
WAND HAND CNTRL MULTIVAC 50 (MISCELLANEOUS) ×4 IMPLANT
WRAP KNEE W/COLD PACKS 25.5X14 (SOFTGOODS) ×4 IMPLANT

## 2015-11-14 NOTE — Anesthesia Preprocedure Evaluation (Signed)
Anesthesia Evaluation  Patient identified by MRN, date of birth, ID band Patient awake    Reviewed: Allergy & Precautions, NPO status , Patient's Chart, lab work & pertinent test results  Airway Mallampati: II       Dental  (+) Teeth Intact, Caps   Pulmonary neg pulmonary ROS,     + decreased breath sounds      Cardiovascular Exercise Tolerance: Good hypertension, Pt. on medications and Pt. on home beta blockers  Rhythm:Regular Rate:Normal     Neuro/Psych negative neurological ROS     GI/Hepatic Neg liver ROS, GERD  Medicated,  Endo/Other  Hypothyroidism   Renal/GU negative Renal ROS     Musculoskeletal   Abdominal (+) + obese,   Peds  Hematology   Anesthesia Other Findings   Reproductive/Obstetrics                             Anesthesia Physical Anesthesia Plan  ASA: II  Anesthesia Plan: General   Post-op Pain Management:    Induction: Intravenous  Airway Management Planned: LMA  Additional Equipment:   Intra-op Plan:   Post-operative Plan: Extubation in OR  Informed Consent: I have reviewed the patients History and Physical, chart, labs and discussed the procedure including the risks, benefits and alternatives for the proposed anesthesia with the patient or authorized representative who has indicated his/her understanding and acceptance.     Plan Discussed with: CRNA  Anesthesia Plan Comments:         Anesthesia Quick Evaluation

## 2015-11-14 NOTE — Progress Notes (Signed)
Still feels dizzy in head and things going round and round     Taking fluids well

## 2015-11-14 NOTE — Progress Notes (Signed)
Pt states she feels dizzy and does not feel well  Feels like everything is going round and round

## 2015-11-14 NOTE — Anesthesia Procedure Notes (Addendum)
Procedure Name: LMA Insertion Date/Time: 11/14/2015 11:58 AM Performed by: Rosaria Ferries, Lamees Gable Pre-anesthesia Checklist: Patient identified Patient Re-evaluated:Patient Re-evaluated prior to inductionOxygen Delivery Method: Circle system utilized Preoxygenation: Pre-oxygenation with 100% oxygen LMA: LMA inserted LMA Size: 4.0 Number of attempts: 1 Placement Confirmation: breath sounds checked- equal and bilateral   Performed by: Rosaria Ferries, Demetra Moya

## 2015-11-14 NOTE — Discharge Instructions (Signed)
°  Instructions after Knee Arthroscopy  ° °- James P. Hooten, Jr., M.D.    ° Dept. of Orthopaedics & Sports Medicine ° [facility] ° [address] ° [place], [place]  [address] ° ° Phone: [phone]   Fax: [phone] ° ° °DIET: °• Drink plenty of non-alcoholic fluids & begin a light diet. °• Resume your normal diet the day after surgery. ° °ACTIVITY:  °• You may use crutches or a walker with weight-bearing as tolerated, unless instructed otherwise. °• You may wean yourself off of the walker or crutches as tolerated.  °• Begin doing gentle exercises. Exercising will reduce the pain and swelling, increase motion, and prevent muscle weakness.   °• Avoid strenuous activities or athletics for a minimum of 4-6 weeks after arthroscopic surgery. °• Do not drive or operate any equipment until instructed. ° °WOUND CARE:  °• Place one to two pillows under the knee the first day or two when sitting or lying.  °• Continue to use the ice packs periodically to reduce pain and swelling. °• The small incisions in your knee are closed with nylon stitches. The stitches will be removed in the office. °• The bulky dressing may be removed on the second day after surgery. DO NOT TOUCH THE STITCHES. Put a Band-Aid over each stitch. Do NOT use any ointments or creams on the incisions.  °• You may bathe or shower after the stitches are removed at the first office visit following surgery. ° °MEDICATIONS: °• You may resume your regular medications. °• Please take the pain medication as prescribed. °• Do not take pain medication on an empty stomach. °• Do not drive or drink alcoholic beverages when taking pain medications. ° °CALL THE OFFICE FOR: °• Temperature above 101 degrees °• Excessive bleeding or drainage on the dressing. °• Excessive swelling, coldness, or paleness of the toes. °• Persistent nausea and vomiting. ° °FOLLOW-UP:  °• You should have an appointment to return to the office in 7-10 days after surgery.   ° ° °AMBULATORY SURGERY  °DISCHARGE INSTRUCTIONS ° ° °1) The drugs that you were given will stay in your system until tomorrow so for the next 24 hours you should not: ° °A) Drive an automobile °B) Make any legal decisions °C) Drink any alcoholic beverage ° ° °2) You may resume regular meals tomorrow.  Today it is better to start with liquids and gradually work up to solid foods. ° °You may eat anything you prefer, but it is better to start with liquids, then soup and crackers, and gradually work up to solid foods. ° ° °3) Please notify your doctor immediately if you have any unusual bleeding, trouble breathing, redness and pain at the surgery site, drainage, fever, or pain not relieved by medication. ° ° ° °4) Additional Instructions: ° ° ° ° ° ° ° °Please contact your physician with any problems or Same Day Surgery at [phone], Monday through Friday 6 am to 4 pm, or [place] at [place] [facility] number at [phone]. ° °

## 2015-11-14 NOTE — Progress Notes (Signed)
Pt states she feels weriod   Feels like her body does not feel right  Shivering

## 2015-11-14 NOTE — Anesthesia Postprocedure Evaluation (Signed)
Anesthesia Post Note  Patient: Judy Graves  Procedure(s) Performed: Procedure(s) (LRB): CHONDROPLASTY (Left) KNEE ARTHROSCOPY WITH LATERAL MENISECTOMY (Left) KNEE ARTHROSCOPY WITH MEDIAL MENISECTOMY (Left)  Patient location during evaluation: PACU Anesthesia Type: General Level of consciousness: awake Pain management: satisfactory to patient Vital Signs Assessment: post-procedure vital signs reviewed and stable Respiratory status: respiratory function stable Cardiovascular status: stable Anesthetic complications: no    Last Vitals:  Filed Vitals:   11/14/15 1356 11/14/15 1409  BP:  151/96  Pulse: 62   Temp:  36.4 C  Resp: 18 18    Last Pain:  Filed Vitals:   11/14/15 1412  PainSc: 0-No pain                 VAN STAVEREN,Dee Maday

## 2015-11-14 NOTE — H&P (Signed)
The patient has been re-examined, and the chart reviewed, and there have been no interval changes to the documented history and physical.    The risks, benefits, and alternatives have been discussed at length. The patient expressed understanding of the risks benefits and agreed with plans for surgical intervention.  James P. Hooten, Jr. M.D.    

## 2015-11-14 NOTE — Progress Notes (Signed)
Elevated left leg on pillows  Ice pack on

## 2015-11-14 NOTE — Progress Notes (Signed)
Called out front to let family know of pt status

## 2015-11-14 NOTE — Transfer of Care (Signed)
Immediate Anesthesia Transfer of Care Note  Patient: Judy Graves  Procedure(s) Performed: Procedure(s): CHONDROPLASTY (Left) KNEE ARTHROSCOPY WITH LATERAL MENISECTOMY (Left) KNEE ARTHROSCOPY WITH MEDIAL MENISECTOMY (Left)  Patient Location: PACU  Anesthesia Type:General  Level of Consciousness: sedated  Airway & Oxygen Therapy: Patient Spontanous Breathing and Patient connected to nasal cannula oxygen  Post-op Assessment: Report given to RN and Post -op Vital signs reviewed and stable  Post vital signs: Reviewed and stable  Last Vitals:  Filed Vitals:   11/14/15 0936  BP: 161/83  Pulse: 71  Temp: 36.3 C  Resp: 18    Last Pain: There were no vitals filed for this visit.       Complications: No apparent anesthesia complications

## 2015-11-14 NOTE — Op Note (Signed)
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   ASSISTANT: none  ANESTHESIA: general  ESTIMATED BLOOD LOSS: Minimal  FLUIDS REPLACED: 1100 mL of crystalloid  TOURNIQUET TIME: Not used   DRAINS: none  IMPLANTS UTILIZED: None  INDICATIONS FOR SURGERY: Judy Graves is a 60 y.o. year old female who has been seen for complaints of left knee pain. MRI demonstrated findings consistent with meniscal pathology. After discussion of the risks and benefits of surgical intervention, the patient expressed understanding of the risks benefits and agree with plans for left knee arthroscopy.   PROCEDURE IN DETAIL: The patient was brought into the operating room and, after adequate general anesthesia was achieved, a tourniquet was applied to the left thigh and the leg was placed in the leg holder. All bony prominences were well padded. The patient's left knee was cleaned and prepped with alcohol and Duraprep and draped in the usual sterile fashion. A "timeout" was performed as per usual protocol. The anticipated portal sites were injected with 0.25% Marcaine with epinephrine. An anterolateral incision was made and a cannula was inserted. A large effusion was evacuated and the knee was distended with fluid using the pump. The scope was advanced down the medial gutter into the medial compartment. Under visualization with the scope, an anteromedial portal was created and a hooked probe was inserted. The medial  meniscus was visualized and probed. There was a complex tear of the posterior horn of the medial meniscus. The tear was debrided using meniscal punches and a 4.5 mm incisor shaver. Final contouring was performed using a 50 ArthroCare wand. The remaining rim of meniscus was visualized and felt to be stable. The articular cartilage was visualized. There was an area of grade 3 chondromalacia involving the medial femoral condyle. This area was debrided and contoured using the ArthroCare wand.  The scope was then advanced into the intercondylar notch. The anterior cruciate ligament was visualized and probed and felt to be intact. The scope was removed from the lateral portal and reinserted via the anteromedial portal to better visualize the lateral compartment. The lateral meniscus was visualized and probed. There was a complex tear of the anterior horn of the lateral meniscus. The tear was debrided using meniscal punches and a 4.5 mm incisor shaver. Contouring was performed using the 50 ArthroCare wand. The posterior horn appeared to be in good condition. The articular cartilage of the lateral compartment was visualized. There was an area of grade 3 chondromalacia involving the lateral aspect of the lateral femoral condyle. This area was debrided and contoured using the 50 ArthroCare wand. Finally, the scope was advanced so as to visualize the patellofemoral articulation. Good patellar tracking was appreciated. The articular surface was in good condition.  The knee was irrigated with copius amounts of fluid and suctioned dry. The anterolateral portal was re-approximated with #3-0 nylon. A combination of 0.25% Marcaine with epinephrine and 4 mg of Morphine were injected via the scope. The scope was removed and the anteromedial portal  was re-approximated with #3-0 nylon. A sterile dressing was applied followed by application of an ice wrap.  The patient tolerated the procedure well and was transported to the PACU  in stable condition.  Judy Graves., M.D.

## 2015-11-14 NOTE — Brief Op Note (Signed)
11/14/2015  12:32 PM  PATIENT:  Judy Graves  60 y.o. female  PRE-OPERATIVE DIAGNOSIS:  INTERNAL DERANGEMENT OF LEFT KNEE  POST-OPERATIVE DIAGNOSIS:  tear posterior horn medial meniscus,  Grade III chondromalacia medial femoral chondyle, tear anterior horn lateral meniscus, grade III chondromalacia lateral femoral chondyle  PROCEDURE:  Procedure(s): CHONDROPLASTY (Left) KNEE ARTHROSCOPY WITH LATERAL MENISECTOMY (Left) KNEE ARTHROSCOPY WITH MEDIAL MENISECTOMY (Left)  SURGEON:  Surgeon(s) and Role:    * Dereck Leep, MD - Primary  ASSISTANTS: none   ANESTHESIA:   general  EBL:  Total I/O In: 1000 [I.V.:1000] Out: -   BLOOD ADMINISTERED:none  DRAINS: none   LOCAL MEDICATIONS USED:  MARCAINE     SPECIMEN:  No Specimen  DISPOSITION OF SPECIMEN:  N/A  COUNTS:  YES  TOURNIQUET:   not used  DICTATION: .Sales executive  PLAN OF CARE: Discharge to home after PACU  PATIENT DISPOSITION:  PACU - hemodynamically stable.   Delay start of Pharmacological VTE agent (>24hrs) due to surgical blood loss or risk of bleeding: not applicable

## 2015-11-15 ENCOUNTER — Encounter: Payer: Self-pay | Admitting: Orthopedic Surgery

## 2015-12-12 ENCOUNTER — Other Ambulatory Visit: Payer: Self-pay

## 2015-12-12 DIAGNOSIS — I10 Essential (primary) hypertension: Secondary | ICD-10-CM

## 2015-12-12 DIAGNOSIS — E785 Hyperlipidemia, unspecified: Secondary | ICD-10-CM

## 2015-12-12 DIAGNOSIS — E038 Other specified hypothyroidism: Secondary | ICD-10-CM

## 2015-12-12 MED ORDER — LEVOTHYROXINE SODIUM 50 MCG PO TABS: 50.0000 ug | ORAL_TABLET | Freq: Every day | ORAL | 1 refills | Status: DC

## 2015-12-12 MED ORDER — CHLORTHALIDONE 25 MG PO TABS: 25.0000 mg | ORAL_TABLET | Freq: Every day | ORAL | 1 refills | Status: DC

## 2015-12-12 MED ORDER — ATORVASTATIN CALCIUM 40 MG PO TABS: 40.0000 mg | ORAL_TABLET | Freq: Every day | ORAL | 1 refills | Status: DC

## 2015-12-12 MED ORDER — LANSOPRAZOLE 30 MG PO CPDR: 30.0000 mg | DELAYED_RELEASE_CAPSULE | Freq: Two times a day (BID) | ORAL | 1 refills | Status: DC

## 2015-12-12 MED ORDER — METOPROLOL SUCCINATE ER 50 MG PO TB24: 50.0000 mg | ORAL_TABLET | Freq: Every day | ORAL | 1 refills | Status: DC

## 2015-12-12 NOTE — Telephone Encounter (Signed)
Patient requesting refill of Metoprolol, Atorvastatin, Chlorthalidone, L-Thyroxine, and Lansoprazole for 90 day supplies.

## 2015-12-13 ENCOUNTER — Telehealth: Payer: Self-pay | Admitting: Family Medicine

## 2015-12-13 NOTE — Telephone Encounter (Signed)
Patient notified

## 2015-12-13 NOTE — Telephone Encounter (Signed)
done

## 2015-12-13 NOTE — Telephone Encounter (Signed)
I sent all of them yesterday

## 2015-12-14 ENCOUNTER — Encounter: Payer: BLUE CROSS/BLUE SHIELD | Admitting: Family Medicine

## 2015-12-16 ENCOUNTER — Encounter: Payer: BLUE CROSS/BLUE SHIELD | Admitting: Family Medicine

## 2016-01-26 DIAGNOSIS — M76822 Posterior tibial tendinitis, left leg: Secondary | ICD-10-CM | POA: Diagnosis not present

## 2016-02-14 ENCOUNTER — Ambulatory Visit: Payer: BLUE CROSS/BLUE SHIELD | Admitting: Family Medicine

## 2016-02-23 DIAGNOSIS — M76822 Posterior tibial tendinitis, left leg: Secondary | ICD-10-CM | POA: Diagnosis not present

## 2016-04-26 ENCOUNTER — Telehealth: Payer: Self-pay | Admitting: Family Medicine

## 2016-04-26 ENCOUNTER — Other Ambulatory Visit: Payer: Self-pay | Admitting: Family Medicine

## 2016-04-26 MED ORDER — LANSOPRAZOLE 30 MG PO CPDR: 30.0000 mg | DELAYED_RELEASE_CAPSULE | Freq: Every day | ORAL | 1 refills | Status: DC

## 2016-04-26 NOTE — Telephone Encounter (Signed)
PT IS NEEDING REFILL PREVACID. [place]

## 2016-04-26 NOTE — Telephone Encounter (Signed)
Pt called back requesting for this RX to go to [facility] instead of [facility] .

## 2016-05-09 ENCOUNTER — Telehealth: Payer: Self-pay | Admitting: Family Medicine

## 2016-05-09 DIAGNOSIS — I10 Essential (primary) hypertension: Secondary | ICD-10-CM

## 2016-05-09 NOTE — Telephone Encounter (Signed)
Patient requesting refill of Chlorthalidone and Metoprolol to [facility].

## 2016-05-11 NOTE — Telephone Encounter (Signed)
LFT MESS TO CALL AND SCHEDULE APPT FOR MEDS. MEDS FOR 30 DAYS WAS SENT

## 2016-05-16 ENCOUNTER — Other Ambulatory Visit: Payer: Self-pay | Admitting: Family Medicine

## 2016-05-16 DIAGNOSIS — E038 Other specified hypothyroidism: Secondary | ICD-10-CM

## 2016-05-16 NOTE — Telephone Encounter (Signed)
Patient requesting refill of Levothyroxine to [facility].

## 2016-05-17 ENCOUNTER — Other Ambulatory Visit: Payer: Self-pay

## 2016-05-17 DIAGNOSIS — E038 Other specified hypothyroidism: Secondary | ICD-10-CM

## 2016-05-17 MED ORDER — LEVOTHYROXINE SODIUM 50 MCG PO TABS: 50.0000 ug | ORAL_TABLET | Freq: Every day | ORAL | 0 refills | Status: DC

## 2016-05-17 NOTE — Telephone Encounter (Signed)
Pt is scheduled for 06/13/16. She did not tell me where her local pharmacy is. Please call her back for me, so you can put the local pharmacy in her chart.

## 2016-05-17 NOTE — Telephone Encounter (Signed)
She needs to have a follow up, I can send 30 day to local pharmacy. Please ask what pharmacy she uses

## 2016-05-17 NOTE — Telephone Encounter (Signed)
Patient states she is unable to use a local pharmacy, [facility]. Thanks

## 2016-05-17 NOTE — Telephone Encounter (Signed)
I am not sending to mail order, must give me the name of local pharmacy

## 2016-05-17 NOTE — Telephone Encounter (Signed)
Patient has been notified by [facility] and myself that Dr. Ancil Boozer will not call in a 90 day supply due to no show and missed appointments. Patient has not been seen in the office since 10/14/2015 and has an upcoming appointment on 06/13/16. Patient was informed Dr. Ancil Boozer ok'ed a 30 day prescription only to local pharmacy. [facility]

## 2016-05-24 ENCOUNTER — Other Ambulatory Visit: Payer: Self-pay | Admitting: Family Medicine

## 2016-05-24 DIAGNOSIS — E785 Hyperlipidemia, unspecified: Secondary | ICD-10-CM

## 2016-05-29 DIAGNOSIS — M1732 Unilateral post-traumatic osteoarthritis, left knee: Secondary | ICD-10-CM | POA: Diagnosis not present

## 2016-05-29 DIAGNOSIS — M25562 Pain in left knee: Secondary | ICD-10-CM | POA: Diagnosis not present

## 2016-06-13 ENCOUNTER — Ambulatory Visit: Payer: BLUE CROSS/BLUE SHIELD | Admitting: Family Medicine

## 2016-06-14 DIAGNOSIS — H2512 Age-related nuclear cataract, left eye: Secondary | ICD-10-CM | POA: Diagnosis not present

## 2016-06-15 DIAGNOSIS — E559 Vitamin D deficiency, unspecified: Secondary | ICD-10-CM | POA: Diagnosis not present

## 2016-06-15 DIAGNOSIS — Z0001 Encounter for general adult medical examination with abnormal findings: Secondary | ICD-10-CM | POA: Diagnosis not present

## 2016-06-15 DIAGNOSIS — I6522 Occlusion and stenosis of left carotid artery: Secondary | ICD-10-CM | POA: Diagnosis not present

## 2016-06-15 DIAGNOSIS — E039 Hypothyroidism, unspecified: Secondary | ICD-10-CM | POA: Diagnosis not present

## 2016-06-15 DIAGNOSIS — I10 Essential (primary) hypertension: Secondary | ICD-10-CM | POA: Diagnosis not present

## 2016-06-15 DIAGNOSIS — E782 Mixed hyperlipidemia: Secondary | ICD-10-CM | POA: Diagnosis not present

## 2016-06-26 ENCOUNTER — Other Ambulatory Visit: Payer: Self-pay | Admitting: Family Medicine

## 2016-06-26 DIAGNOSIS — E038 Other specified hypothyroidism: Secondary | ICD-10-CM

## 2016-06-26 NOTE — Telephone Encounter (Signed)
Patient requesting refill of [facility]othyrox[facility].  

## 2016-06-27 DIAGNOSIS — I6522 Occlusion and stenosis of left carotid artery: Secondary | ICD-10-CM | POA: Diagnosis not present

## 2016-06-27 NOTE — Telephone Encounter (Signed)
Pt transferred from our office

## 2016-08-20 ENCOUNTER — Other Ambulatory Visit: Payer: Self-pay | Admitting: Internal Medicine

## 2016-08-20 DIAGNOSIS — Z1231 Encounter for screening mammogram for malignant neoplasm of breast: Secondary | ICD-10-CM

## 2016-08-20 DIAGNOSIS — I10 Essential (primary) hypertension: Secondary | ICD-10-CM | POA: Diagnosis not present

## 2016-08-20 DIAGNOSIS — E039 Hypothyroidism, unspecified: Secondary | ICD-10-CM | POA: Diagnosis not present

## 2016-08-20 DIAGNOSIS — E782 Mixed hyperlipidemia: Secondary | ICD-10-CM | POA: Diagnosis not present

## 2016-08-20 DIAGNOSIS — I69898 Other sequelae of other cerebrovascular disease: Secondary | ICD-10-CM | POA: Diagnosis not present

## 2016-09-21 ENCOUNTER — Ambulatory Visit
Admission: RE | Admit: 2016-09-21 | Discharge: 2016-09-21 | Disposition: A | Discharge status: Home / Self Care | Payer: BLUE CROSS/BLUE SHIELD | Source: Ambulatory Visit | Attending: Internal Medicine | Admitting: Internal Medicine

## 2016-09-21 DIAGNOSIS — Z1231 Encounter for screening mammogram for malignant neoplasm of breast: Secondary | ICD-10-CM | POA: Diagnosis not present

## 2016-10-10 DIAGNOSIS — Z0001 Encounter for general adult medical examination with abnormal findings: Secondary | ICD-10-CM | POA: Diagnosis not present

## 2016-10-10 DIAGNOSIS — I6522 Occlusion and stenosis of left carotid artery: Secondary | ICD-10-CM | POA: Diagnosis not present

## 2016-10-10 DIAGNOSIS — E782 Mixed hyperlipidemia: Secondary | ICD-10-CM | POA: Diagnosis not present

## 2016-10-10 DIAGNOSIS — Z124 Encounter for screening for malignant neoplasm of cervix: Secondary | ICD-10-CM | POA: Diagnosis not present

## 2016-10-10 DIAGNOSIS — I10 Essential (primary) hypertension: Secondary | ICD-10-CM | POA: Diagnosis not present

## 2016-10-10 DIAGNOSIS — E876 Hypokalemia: Secondary | ICD-10-CM | POA: Diagnosis not present

## 2016-12-11 DIAGNOSIS — M1712 Unilateral primary osteoarthritis, left knee: Secondary | ICD-10-CM | POA: Diagnosis not present

## 2017-01-29 DIAGNOSIS — R300 Dysuria: Secondary | ICD-10-CM | POA: Diagnosis not present

## 2017-01-29 DIAGNOSIS — K644 Residual hemorrhoidal skin tags: Secondary | ICD-10-CM | POA: Diagnosis not present

## 2017-01-29 DIAGNOSIS — N3090 Cystitis, unspecified without hematuria: Secondary | ICD-10-CM | POA: Diagnosis not present

## 2017-02-08 IMAGING — MR MR KNEE*L* W/O CM
6 series · 40 of 40 positions shown · non-contrast
Comparison: None.

CLINICAL DATA: Left knee for 2-3 months.  No known injury.

EXAM:
MRI OF THE LEFT KNEE WITHOUT CONTRAST
TECHNIQUE: Multiplanar, multisequence MR imaging of the knee was performed. No
intravenous contrast was administered.

[Series 5: T1 · coronal · 3.0mm · 0.62mm/px · 5 of 33 slices shown]
[im 1/33]
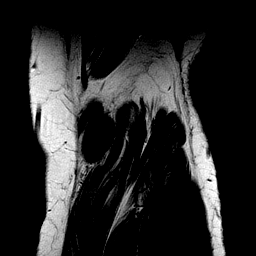
[im 9/33]
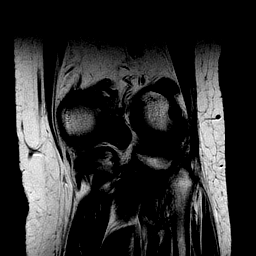
[im 17/33]
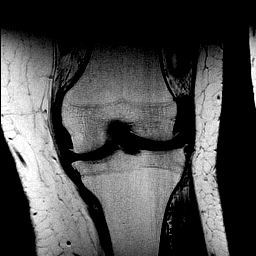
[im 25/33]
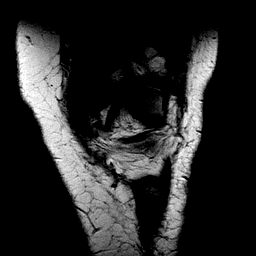
[im 33/33]
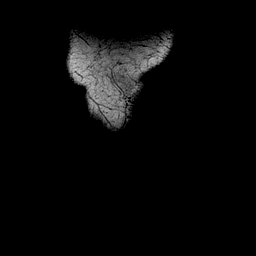

[Series 7: PD fat-sat · sagittal · 3.0mm · 0.62mm/px · 7 of 35 slices shown (1 of 3)]
[im 1/35]
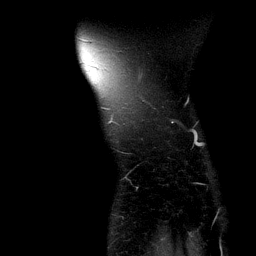
[im 6/35]
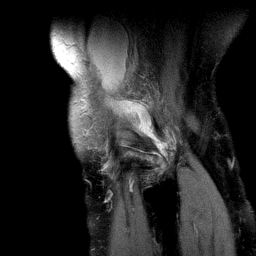
[im 12/35]
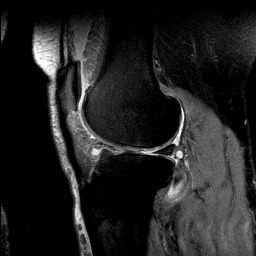
[im 18/35]
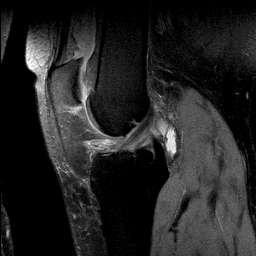
[im 23/35]
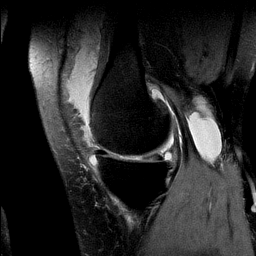
[im 29/35]
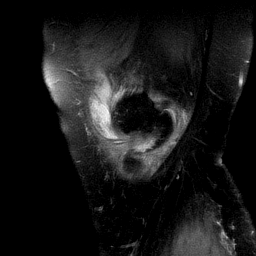
[im 35/35]
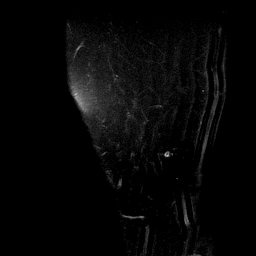

[Series 8: T2 fat-sat · coronal · 3.0mm · 0.62mm/px · 7 of 35 slices shown]
[im 1/35]
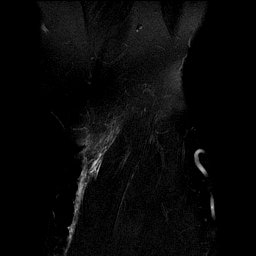
[im 6/35]
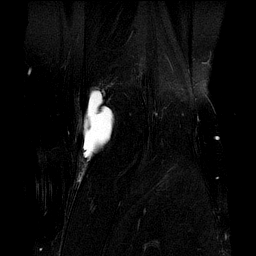
[im 12/35]
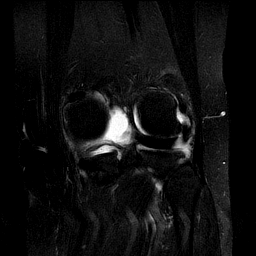
[im 18/35]
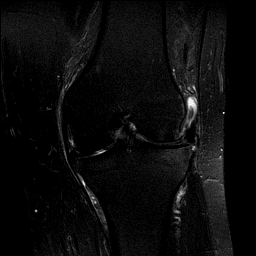
[im 23/35]
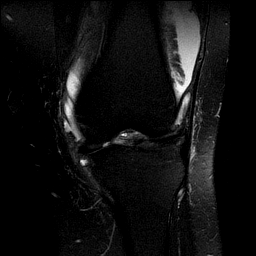
[im 29/35]
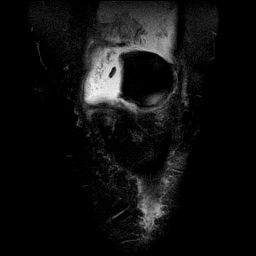
[im 35/35]
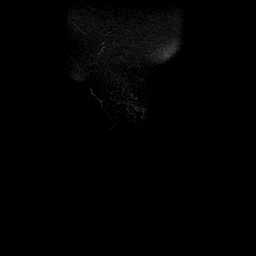

[Series 9: PD fat-sat · coronal · 3.0mm · 0.62mm/px · 7 of 35 slices shown (2 of 3)]
[im 1/35]
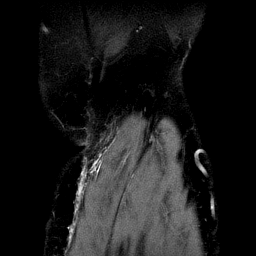
[im 6/35]
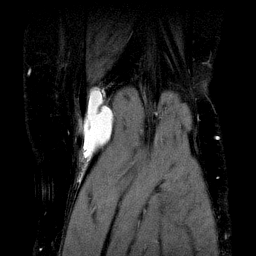
[im 12/35]
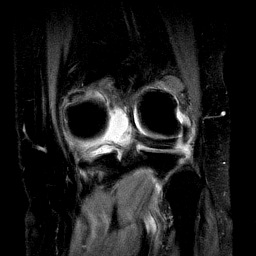
[im 18/35]
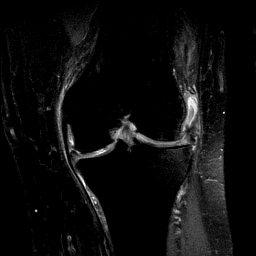
[im 23/35]
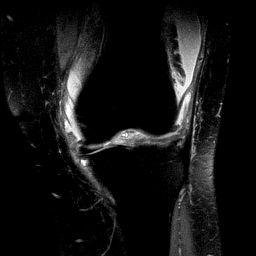
[im 29/35]
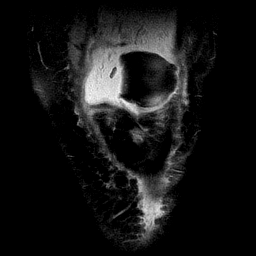
[im 35/35]
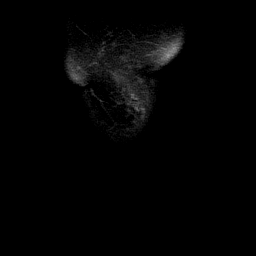

[Series 10: PD fat-sat · axial · 3.0mm · 0.29mm/px · z∈[-43,+76]mm · 7 of 37 slices shown (3 of 3)]
[im 1/37]
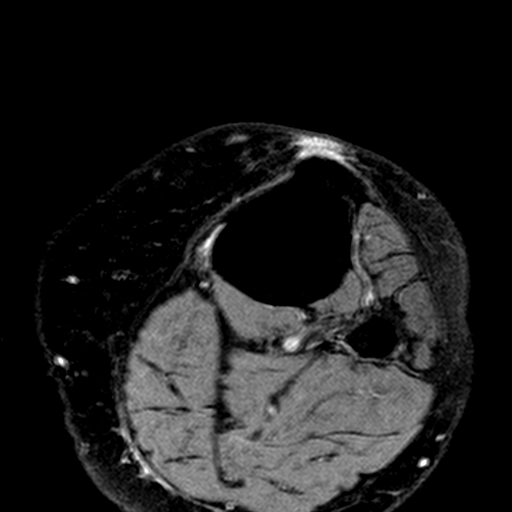
[im 7/37]
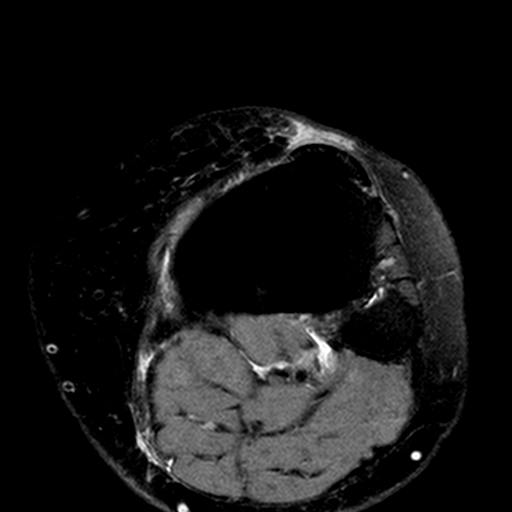
[im 13/37]
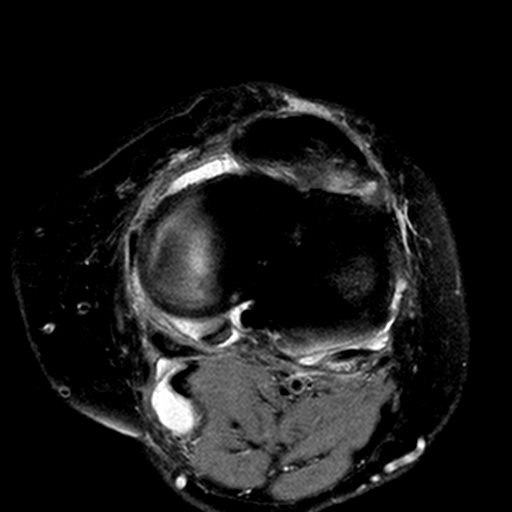
[im 19/37]
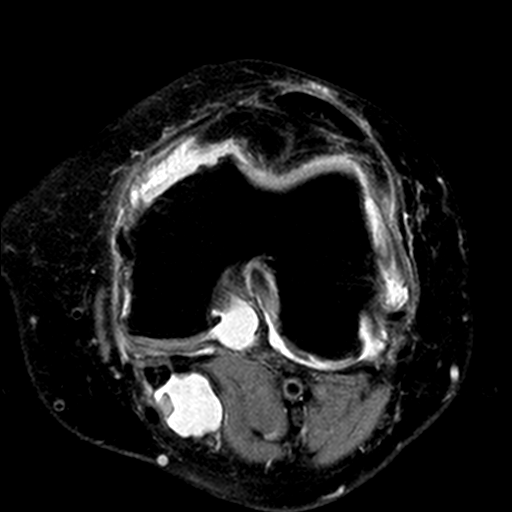
[im 25/37]
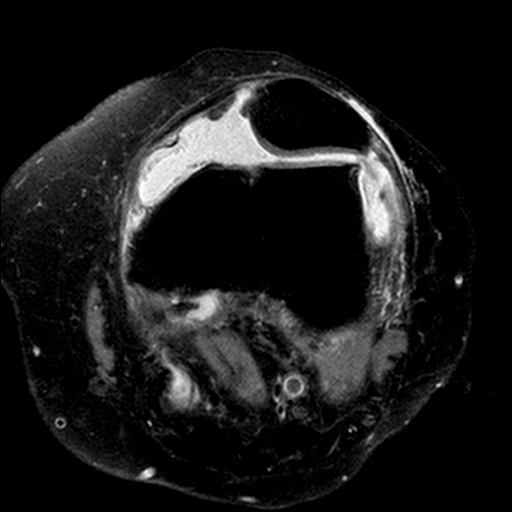
[im 31/37]
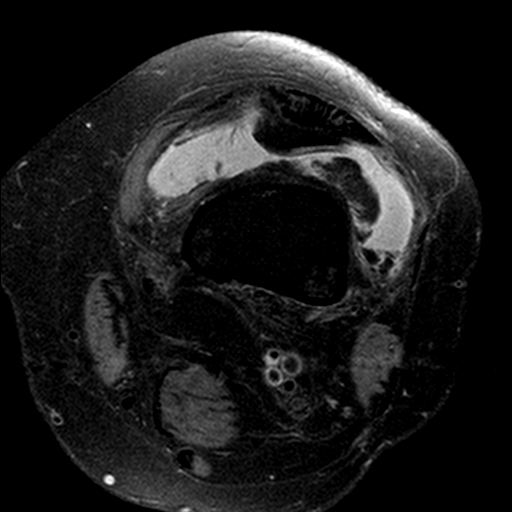
[im 37/37]
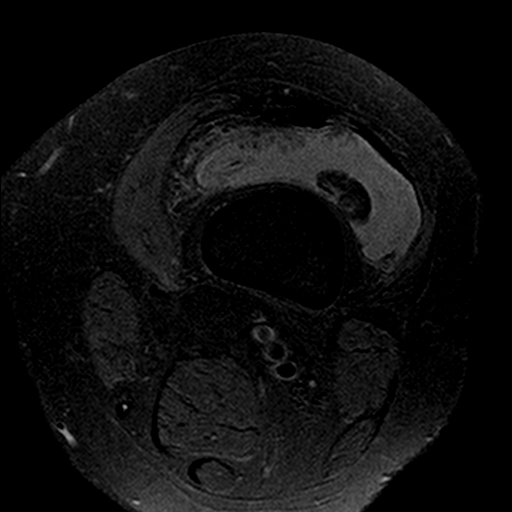

[Series 11: STIR · coronal · 3.0mm · 0.62mm/px · 7 of 35 slices shown]
[im 1/35]
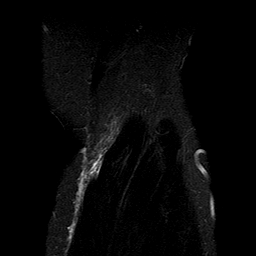
[im 6/35]
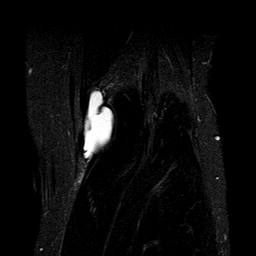
[im 12/35]
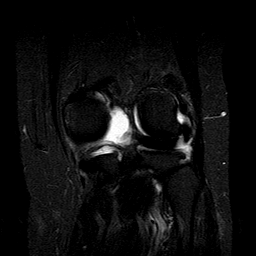
[im 18/35]
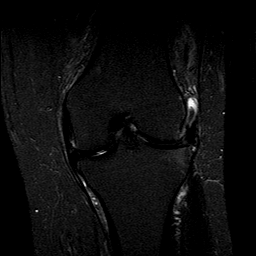
[im 23/35]
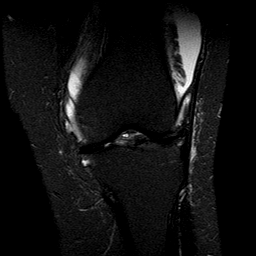
[im 29/35]
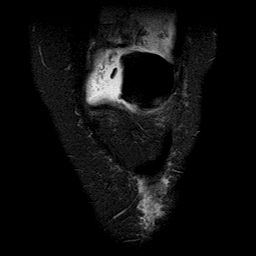
[im 35/35]
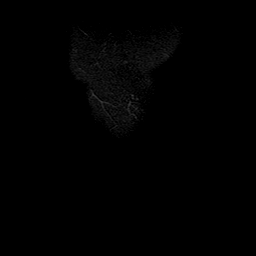

[40 of 40 positions shown; findings below may reference images not displayed]

FINDINGS: MENISCI

Medial meniscus: Degeneration of the body of the medial meniscus
with a radial tear of the free edge of the body of the medial
meniscus. Oblique tear of the posterior horn of the medial meniscus
extending to inferior articular surface.

Lateral meniscus: Maceration anterior horn and body of the lateral
meniscus with a fragment flipped peripherally into the superior
gutter.

LIGAMENTS

Cruciates:  Intact ACL and PCL.

Collaterals: Medial collateral ligament is intact. Lateral
collateral ligament complex is intact.

CARTILAGE

Patellofemoral: Chondral thinning of the lateral patellofemoral
compartment.

Medial: Partial-thickness cartilage loss of the medial femoral
condyle and medial tibial plateau with areas of high-grade
partial-thickness cartilage loss.

Lateral: High-grade partial-thickness cartilage loss with areas of
full-thickness cartilage loss of the lateral tibial plateau with
mild subchondral reactive marrow changes. High-grade
partial-thickness cartilage loss of the lateral femoral condyle with
a small full-thickness cartilage fissure.

Joint: Large joint effusion. Normal Hoffa's fat. No plical
thickening.

Popliteal Fossa: Moderate size Baker cyst. Small loose body within
the Baker cyst. Intact popliteus tendon.

Extensor Mechanism:  Intact.

Bones: No other marrow signal abnormality. No fracture or
dislocation.
IMPRESSION: 1. Degeneration of the body of the medial meniscus with a radial
tear of the free edge of the body of the medial meniscus. Oblique
tear of the posterior horn of the medial meniscus extending to
inferior articular surface.
2. Maceration anterior horn and body of the lateral meniscus with a
fragment flipped peripherally into the superior gutter.
3. Tricompartmental cartilage abnormalities most severe in the
lateral femorotibial compartment as described above.
4. Large joint effusion.
5. Moderate-sized Baker cyst with a loose body within the Baker
cyst.

## 2017-04-08 DIAGNOSIS — I10 Essential (primary) hypertension: Secondary | ICD-10-CM | POA: Diagnosis not present

## 2017-04-08 DIAGNOSIS — E039 Hypothyroidism, unspecified: Secondary | ICD-10-CM | POA: Diagnosis not present

## 2017-04-08 DIAGNOSIS — M25511 Pain in right shoulder: Secondary | ICD-10-CM | POA: Diagnosis not present

## 2017-04-08 DIAGNOSIS — E782 Mixed hyperlipidemia: Secondary | ICD-10-CM | POA: Diagnosis not present

## 2017-05-20 DIAGNOSIS — J019 Acute sinusitis, unspecified: Secondary | ICD-10-CM | POA: Diagnosis not present

## 2017-05-20 DIAGNOSIS — R112 Nausea with vomiting, unspecified: Secondary | ICD-10-CM | POA: Diagnosis not present

## 2017-05-20 DIAGNOSIS — R6889 Other general symptoms and signs: Secondary | ICD-10-CM | POA: Diagnosis not present

## 2017-05-20 DIAGNOSIS — J9801 Acute bronchospasm: Secondary | ICD-10-CM | POA: Diagnosis not present

## 2017-05-20 DIAGNOSIS — R509 Fever, unspecified: Secondary | ICD-10-CM | POA: Diagnosis not present

## 2017-05-20 DIAGNOSIS — J209 Acute bronchitis, unspecified: Secondary | ICD-10-CM | POA: Diagnosis not present

## 2017-05-20 DIAGNOSIS — R05 Cough: Secondary | ICD-10-CM | POA: Diagnosis not present

## 2017-05-20 DIAGNOSIS — B9689 Other specified bacterial agents as the cause of diseases classified elsewhere: Secondary | ICD-10-CM | POA: Diagnosis not present

## 2017-08-06 DIAGNOSIS — H2511 Age-related nuclear cataract, right eye: Secondary | ICD-10-CM | POA: Diagnosis not present

## 2017-08-14 ENCOUNTER — Other Ambulatory Visit: Payer: Self-pay | Admitting: Nurse Practitioner

## 2017-08-14 DIAGNOSIS — I10 Essential (primary) hypertension: Secondary | ICD-10-CM

## 2017-08-14 MED ORDER — CHLORTHALIDONE 25 MG PO TABS: 25.0000 mg | ORAL_TABLET | Freq: Every day | ORAL | 1 refills | Status: DC

## 2017-09-10 ENCOUNTER — Other Ambulatory Visit: Payer: Self-pay | Admitting: Internal Medicine

## 2017-09-10 DIAGNOSIS — Z1231 Encounter for screening mammogram for malignant neoplasm of breast: Secondary | ICD-10-CM

## 2017-10-01 ENCOUNTER — Ambulatory Visit
Admission: RE | Admit: 2017-10-01 | Discharge: 2017-10-01 | Disposition: A | Discharge status: Home / Self Care | Payer: BLUE CROSS/BLUE SHIELD | Source: Ambulatory Visit | Attending: Internal Medicine | Admitting: Internal Medicine

## 2017-10-01 DIAGNOSIS — Z1231 Encounter for screening mammogram for malignant neoplasm of breast: Secondary | ICD-10-CM | POA: Diagnosis not present

## 2017-10-02 ENCOUNTER — Other Ambulatory Visit: Payer: Self-pay | Admitting: Nurse Practitioner

## 2017-10-02 DIAGNOSIS — E782 Mixed hyperlipidemia: Secondary | ICD-10-CM | POA: Diagnosis not present

## 2017-10-02 DIAGNOSIS — I10 Essential (primary) hypertension: Secondary | ICD-10-CM | POA: Diagnosis not present

## 2017-10-02 DIAGNOSIS — E559 Vitamin D deficiency, unspecified: Secondary | ICD-10-CM | POA: Diagnosis not present

## 2017-10-02 DIAGNOSIS — Z0001 Encounter for general adult medical examination with abnormal findings: Secondary | ICD-10-CM | POA: Diagnosis not present

## 2017-10-02 DIAGNOSIS — E039 Hypothyroidism, unspecified: Secondary | ICD-10-CM | POA: Diagnosis not present

## 2017-10-03 LAB — LIPID PANEL W/O CHOL/HDL RATIO
Cholesterol, Total: 150 mg/dL (ref 100–199)
HDL: 44 mg/dL (ref >39)
LDL Calculated: 90 mg/dL (ref 0–99)
Triglycerides: 82 mg/dL (ref 0–149)
VLDL CHOLESTEROL CAL: 16 mg/dL (ref 5–40)

## 2017-10-03 LAB — VITAMIN D 25 HYDROXY (VIT D DEFICIENCY, FRACTURES): Vit D, 25-Hydroxy: 25.4 ng/mL — ABNORMAL LOW (ref 30.0–100.0)

## 2017-10-03 LAB — COMPREHENSIVE METABOLIC PANEL
ALK PHOS: 80 IU/L (ref 39–117)
ALT: 26 IU/L (ref 0–32)
AST: 16 IU/L (ref 0–40)
Albumin/Globulin Ratio: 1.7 (ref 1.2–2.2)
Albumin: 4.2 g/dL (ref 3.6–4.8)
BUN/Creatinine Ratio: 22 (ref 12–28)
BUN: 22 mg/dL (ref 8–27)
Bilirubin Total: 0.5 mg/dL (ref 0.0–1.2)
CO2: 28 mmol/L (ref 20–29)
Calcium: 9.2 mg/dL (ref 8.7–10.3)
Chloride: 99 mmol/L (ref 96–106)
Creatinine, Ser: 1.02 mg/dL — ABNORMAL HIGH (ref 0.57–1.00)
GFR calc Af Amer: 69 mL/min/{1.73_m2} (ref >59)
GFR calc non Af Amer: 60 mL/min/{1.73_m2} (ref >59)
GLUCOSE: 127 mg/dL — AB (ref 65–99)
Globulin, Total: 2.5 g/dL (ref 1.5–4.5)
Potassium: 3.6 mmol/L (ref 3.5–5.2)
Sodium: 143 mmol/L (ref 134–144)
Total Protein: 6.7 g/dL (ref 6.0–8.5)

## 2017-10-03 LAB — CBC
HEMATOCRIT: 41.2 % (ref 34.0–46.6)
HEMOGLOBIN: 14.2 g/dL (ref 11.1–15.9)
MCH: 31.1 pg (ref 26.6–33.0)
MCHC: 34.5 g/dL (ref 31.5–35.7)
MCV: 90 fL (ref 79–97)
Platelets: 192 10*3/uL (ref 150–450)
RBC: 4.57 x10E6/uL (ref 3.77–5.28)
RDW: 12.9 % (ref 12.3–15.4)
WBC: 9.1 10*3/uL (ref 3.4–10.8)

## 2017-10-03 LAB — T4, FREE: FREE T4: 1.36 ng/dL (ref 0.82–1.77)

## 2017-10-03 LAB — TSH: TSH: 4.02 u[IU]/mL (ref 0.450–4.500)

## 2017-10-15 ENCOUNTER — Encounter: Payer: Self-pay | Admitting: Nurse Practitioner

## 2017-10-15 ENCOUNTER — Ambulatory Visit: Payer: BLUE CROSS/BLUE SHIELD | Admitting: Nurse Practitioner

## 2017-10-15 VITALS — BP 157/76 | HR 79 | Resp 16 | Ht 66.0 in | Wt 194.6 lb

## 2017-10-15 DIAGNOSIS — E039 Hypothyroidism, unspecified: Secondary | ICD-10-CM | POA: Diagnosis not present

## 2017-10-15 DIAGNOSIS — R300 Dysuria: Secondary | ICD-10-CM

## 2017-10-15 DIAGNOSIS — Z0001 Encounter for general adult medical examination with abnormal findings: Secondary | ICD-10-CM | POA: Diagnosis not present

## 2017-10-15 DIAGNOSIS — R229 Localized swelling, mass and lump, unspecified: Secondary | ICD-10-CM | POA: Diagnosis not present

## 2017-10-15 DIAGNOSIS — I10 Essential (primary) hypertension: Secondary | ICD-10-CM

## 2017-10-15 NOTE — Progress Notes (Signed)
[facility] [place], [place] [address]  Internal MEDICINE  Office Visit Note  Patient Name: Judy Graves  [id number]  [id number]  Date of Service: 10/23/2017   Pt is here for routine health maintenance examination  Chief Complaint  Patient presents with  . Annual Exam  . Hypertension  . Cyst    skin of left side of neck.      The patient states that she has a cyst on the back of her neck. Seems to be getting larger. Is sometimes tender when it is touched.   Hypertension  This is a chronic problem. The current episode started more than 1 year ago. The problem is unchanged. The problem is resistant. Pertinent negatives include no chest pain, headaches, neck pain, palpitations or shortness of breath. Agents associated with hypertension include thyroid hormones. Risk factors for coronary artery disease include hyperhomocysteinemia, dyslipidemia and post-menopausal state. Past treatments include beta blockers. The current treatment provides moderate improvement. There are no compliance problems.      Current Medication: Outpatient Encounter Medications as of 10/15/2017  Medication Sig  . aspirin EC 81 MG tablet Take 81 mg by mouth once.  . chlorthalidone (HYGROTON) 25 MG tablet Take 1 tablet (25 mg total) by mouth daily.  Marland Kitchen EPIPEN 2-PAK 0.3 MG/0.3ML SOAJ injection AS DIRECTED AS DIRECTED INJECTION 2  . lansoprazole (PREVACID) 30 MG capsule Take 1 capsule (30 mg total) by mouth daily.  Marland Kitchen levothyroxine (SYNTHROID, LEVOTHROID) 50 MCG tablet Take 1 tablet (50 mcg total) by mouth daily. And 2 on Sundays  . metoprolol succinate (TOPROL-XL) 50 MG 24 hr tablet Take 1 tablet (50 mg total) by mouth daily.  . [DISCONTINUED] atorvastatin (LIPITOR) 40 MG tablet Take 1 tablet (40 mg total) by mouth daily.  . [DISCONTINUED] aspirin 81 MG chewable tablet Chew 1 tablet by mouth daily.  . [DISCONTINUED] HYDROcodone-acetaminophen (NORCO) 5-325 MG tablet Take 1-2 tablets by  mouth every 4 (four) hours as needed for moderate pain. (Patient not taking: Reported on 10/15/2017)  . [DISCONTINUED] potassium chloride SA (K-DUR,KLOR-CON) 20 MEQ tablet Take 1 tablet (20 mEq total) by mouth daily. (Patient not taking: Reported on 10/15/2017)   No facility-administered encounter medications on file as of 10/15/2017.     Surgical History: Past Surgical History:  Procedure Laterality Date  . CARPAL TUNNEL RELEASE Right 2010   Dr. Sabra Heck  . CATARACT EXTRACTION W/PHACO Left 11/02/2015   Procedure: CATARACT EXTRACTION PHACO AND INTRAOCULAR LENS PLACEMENT ([place]) left eye;  Surgeon: Leandrew Koyanagi, MD;  Location: [facility];  Service: Ophthalmology;  Laterality: Left;  . CHONDROPLASTY Left 11/14/2015   Procedure: CHONDROPLASTY;  Surgeon: Dereck Leep, MD;  Location: [facility];  Service: Orthopedics;  Laterality: Left;  . KNEE ARTHROSCOPY WITH LATERAL MENISECTOMY Left 11/14/2015   Procedure: KNEE ARTHROSCOPY WITH LATERAL MENISECTOMY;  Surgeon: Dereck Leep, MD;  Location: [facility];  Service: Orthopedics;  Laterality: Left;  . KNEE ARTHROSCOPY WITH MEDIAL MENISECTOMY Left 11/14/2015   Procedure: KNEE ARTHROSCOPY WITH MEDIAL MENISECTOMY;  Surgeon: Dereck Leep, MD;  Location: [facility];  Service: Orthopedics;  Laterality: Left;  . TONSILLECTOMY    . TUBAL LIGATION      Medical History: Past Medical History:  Diagnosis Date  . Allergy   . Arthritis    in knee  . Chronic constipation   . Chronic insomnia   . GERD (gastroesophageal reflux disease)   . Hemorrhoids   . Hypertension   . Hypothyroidism, adult   .  Impingement syndrome of right shoulder    [facility] subacromial bursitis and tendinitis right shoulder  . Lumbar spinal stenosis    Dr. Phyllis Ginger  . Lump or mass in breast    Dr. Jamal Collin evaluated, likely lipoma  . Menopause   . Metabolic syndrome   . Mild carpal tunnel syndrome of right wrist   . Obesity (BMI 35.0-39.9 without comorbidity)   .  Proteinuria     Family History: Family History  Problem Relation Age of Onset  . Cancer Mother        Breast  . Diabetes Mother   . Breast cancer Mother   . Hypertension Father   . CVA Father        11's  . Hypertension Sister   . Hypertension Brother   . Breast cancer Paternal Aunt       Review of Systems  Constitutional: Negative for activity change, chills, fatigue and unexpected weight change.  HENT: Negative for congestion, postnasal drip, rhinorrhea, sneezing and sore throat.   Eyes: Negative.  Negative for redness.  Respiratory: Negative for cough, chest tightness, shortness of breath and wheezing.   Cardiovascular: Negative for chest pain and palpitations.       Blood pressure is noted to be borderline elevated today, but normal in the past.   Gastrointestinal: Negative for abdominal pain, constipation, diarrhea, nausea and vomiting.  Endocrine:       Thyroid disease is well managed.   Genitourinary: Negative for dysuria and frequency.  Musculoskeletal: Negative for arthralgias, back pain, joint swelling and neck pain.  Skin: Negative for rash.       Fluid filled cyst on the left side of her neck.   Allergic/Immunologic: Negative for environmental allergies.  Neurological: Negative for dizziness, tremors, numbness and headaches.  Hematological: Negative for adenopathy. Does not bruise/bleed easily.  Psychiatric/Behavioral: Negative for behavioral problems (Depression), sleep disturbance and suicidal ideas. The patient is not nervous/anxious.      Today's Vitals   10/15/17 1001  BP: (!) 157/76  Pulse: 79  Resp: 16  SpO2: 94%  Weight: 194 lb 9.6 oz (88.3 kg)  Height: 5\' 6"  (1.676 m)    Physical Exam  Constitutional: She is oriented to person, place, and time. She appears well-developed and well-nourished. No distress.  HENT:  Head: Normocephalic and atraumatic.  Nose: Nose normal.  Mouth/Throat: Oropharynx is clear and moist. No oropharyngeal exudate.    Eyes: Pupils are equal, round, and reactive to light. Conjunctivae and EOM are normal.  Neck: Normal range of motion. Neck supple. No JVD present. No tracheal deviation present. No thyromegaly present.    Cardiovascular: Normal rate, regular rhythm, normal heart sounds and intact distal pulses. Exam reveals no gallop and no friction rub.  No murmur heard. Pulmonary/Chest: Effort normal and breath sounds normal. No respiratory distress. She has no wheezes. She has no rales. She exhibits no tenderness. Right breast exhibits no inverted nipple, no mass, no nipple discharge, no skin change and no tenderness. Left breast exhibits no inverted nipple, no mass, no nipple discharge, no skin change and no tenderness.  Abdominal: Soft. Bowel sounds are normal. There is no tenderness.  Musculoskeletal: Normal range of motion.  Lymphadenopathy:    She has no cervical adenopathy.  Neurological: She is alert and oriented to person, place, and time. No cranial nerve deficit.  Skin: Skin is warm and dry. She is not diaphoretic.  Psychiatric: She has a normal mood and affect. Her behavior is normal. Judgment and  thought content normal.  Nursing note and vitals reviewed.    LABS: Recent Results (from the past 2160 hour(s))  Comprehensive metabolic panel     Status: Abnormal   Collection Time: 10/02/17 11:45 AM  Result Value Ref Range   Glucose 127 (H) 65 - 99 mg/dL   BUN 22 8 - 27 mg/dL   Creatinine, Ser 1.02 (H) 0.57 - 1.00 mg/dL   GFR calc non Af Amer 60 >59 mL/min/1.73   GFR calc Af Amer 69 >59 mL/min/1.73   BUN/Creatinine Ratio 22 12 - 28   Sodium 143 134 - 144 mmol/L   Potassium 3.6 3.5 - 5.2 mmol/L   Chloride 99 96 - 106 mmol/L   CO2 28 20 - 29 mmol/L   Calcium 9.2 8.7 - 10.3 mg/dL   Total Protein 6.7 6.0 - 8.5 g/dL   Albumin 4.2 3.6 - 4.8 g/dL   Globulin, Total 2.5 1.5 - 4.5 g/dL   Albumin/Globulin Ratio 1.7 1.2 - 2.2   Bilirubin Total 0.5 0.0 - 1.2 mg/dL   Alkaline Phosphatase 80 39 -  117 IU/L   AST 16 0 - 40 IU/L   ALT 26 0 - 32 IU/L  CBC     Status: None   Collection Time: 10/02/17 11:45 AM  Result Value Ref Range   WBC 9.1 3.4 - 10.8 x10E3/uL   RBC 4.57 3.77 - 5.28 x10E6/uL   Hemoglobin 14.2 11.1 - 15.9 g/dL   Hematocrit 41.2 34.0 - 46.6 %   MCV 90 79 - 97 fL   MCH 31.1 26.6 - 33.0 pg   MCHC 34.5 31.5 - 35.7 g/dL   RDW 12.9 12.3 - 15.4 %   Platelets 192 150 - 450 x10E3/uL    Comment:               **Please note reference interval change**  Lipid Panel w/o Chol/HDL Ratio     Status: None   Collection Time: 10/02/17 11:45 AM  Result Value Ref Range   Cholesterol, Total 150 100 - 199 mg/dL   Triglycerides 82 0 - 149 mg/dL   HDL 44 >39 mg/dL   VLDL Cholesterol Cal 16 5 - 40 mg/dL   LDL Calculated 90 0 - 99 mg/dL  T4, free     Status: None   Collection Time: 10/02/17 11:45 AM  Result Value Ref Range   Free T4 1.36 0.82 - 1.77 ng/dL  TSH     Status: None   Collection Time: 10/02/17 11:45 AM  Result Value Ref Range   TSH 4.020 0.450 - 4.500 uIU/mL  VITAMIN D 25 Hydroxy (Vit-D Deficiency, Fractures)     Status: Abnormal   Collection Time: 10/02/17 11:45 AM  Result Value Ref Range   Vit D, 25-Hydroxy 25.4 (L) 30.0 - 100.0 ng/mL    Comment: Vitamin D deficiency has been defined by the Institute of Medicine and an Endocrine Society practice guideline as a level of serum 25-OH vitamin D less than 20 ng/mL (1,2). The Endocrine Society went on to further define vitamin D insufficiency as a level between 21 and 29 ng/mL (2). 1. IOM (Institute of Medicine). 2010. Dietary reference    intakes for calcium and D. [place]: The    [employer]. 2. Holick MF, Binkley [place], Bischoff-Ferrari HA, et al.    Evaluation, treatment, and prevention of vitamin D    deficiency: an Endocrine Society clinical practice    guideline. JCEM. 2011 Jul; 96(7):1911-30.   Urinalysis, Routine  w reflex microscopic     Status: None   Collection Time: 10/15/17  2:33 PM    Result Value Ref Range   Specific Gravity, UA 1.026 1.005 - 1.030   pH, UA 6.5 5.0 - 7.5   Color, UA Yellow Yellow   Appearance Ur Clear Clear   Leukocytes, UA Negative Negative   Protein, UA Negative Negative/Trace   Glucose, UA Negative Negative   Ketones, UA Negative Negative   RBC, UA Negative Negative   Bilirubin, UA Negative Negative   Urobilinogen, Ur 0.2 0.2 - 1.0 mg/dL   Nitrite, UA Negative Negative   Microscopic Examination Comment     Comment: Microscopic not indicated and not performed.   Assessment/Plan: 1. Encounter for general adult medical examination with abnormal findings Annual health maintenance exam today  2. Localized superficial swelling, mass, or lump Fluid filled cyst left posterior neck. Refer to dermatology for further evaluation and treatment.  - Ambulatory referral to Dermatology  3. Benign hypertension Generally stable. Continue bp medication as prescribed.   4. Adult hypothyroidism Recent thyroid panel stable. Continue levothyroxine as prescribed .  5. Dysuria - Urinalysis, Routine w reflex microscopic  General Counseling: Beyonka verbalizes understanding of the findings of todays visit and agrees with plan of treatment. I have discussed any further diagnostic evaluation that may be needed or ordered today. We also reviewed her medications today. she has been encouraged to call the office with any questions or concerns that should arise related to todays visit.    Counseling:  This patient was seen by Leretha Pol, FNP- C in Collaboration with Dr Lavera Guise as a part of collaborative care agreement  Orders Placed This Encounter  Procedures  . Urinalysis, Routine w reflex microscopic  . Ambulatory referral to Dermatology      Time spent: Wiggins, MD  Internal Medicine

## 2017-10-16 LAB — URINALYSIS, ROUTINE W REFLEX MICROSCOPIC
Bilirubin, UA: NEGATIVE
GLUCOSE, UA: NEGATIVE
Ketones, UA: NEGATIVE
Leukocytes, UA: NEGATIVE
Nitrite, UA: NEGATIVE
PROTEIN UA: NEGATIVE
RBC, UA: NEGATIVE
Specific Gravity, UA: 1.026 (ref 1.005–1.030)
Urobilinogen, Ur: 0.2 mg/dL (ref 0.2–1.0)
pH, UA: 6.5 (ref 5.0–7.5)

## 2017-10-21 ENCOUNTER — Other Ambulatory Visit: Payer: Self-pay

## 2017-10-21 DIAGNOSIS — E785 Hyperlipidemia, unspecified: Secondary | ICD-10-CM

## 2017-10-21 MED ORDER — ATORVASTATIN CALCIUM 40 MG PO TABS: 40.0000 mg | ORAL_TABLET | Freq: Every day | ORAL | 1 refills | Status: DC

## 2017-10-23 DIAGNOSIS — Z0001 Encounter for general adult medical examination with abnormal findings: Secondary | ICD-10-CM | POA: Insufficient documentation

## 2017-10-23 DIAGNOSIS — R229 Localized swelling, mass and lump, unspecified: Secondary | ICD-10-CM | POA: Insufficient documentation

## 2017-10-23 DIAGNOSIS — R300 Dysuria: Secondary | ICD-10-CM | POA: Insufficient documentation

## 2017-11-05 ENCOUNTER — Other Ambulatory Visit: Payer: Self-pay

## 2017-11-05 NOTE — Progress Notes (Signed)
cologuard order faxed to [facility]

## 2017-11-14 DIAGNOSIS — Z1211 Encounter for screening for malignant neoplasm of colon: Secondary | ICD-10-CM | POA: Diagnosis not present

## 2017-11-14 DIAGNOSIS — Z1212 Encounter for screening for malignant neoplasm of rectum: Secondary | ICD-10-CM | POA: Diagnosis not present

## 2017-11-15 ENCOUNTER — Other Ambulatory Visit: Payer: Self-pay | Admitting: Nurse Practitioner

## 2017-11-15 DIAGNOSIS — I10 Essential (primary) hypertension: Secondary | ICD-10-CM

## 2017-11-15 MED ORDER — METOPROLOL SUCCINATE ER 50 MG PO TB24: 50.0000 mg | ORAL_TABLET | Freq: Every day | ORAL | 1 refills | Status: DC

## 2017-12-13 ENCOUNTER — Other Ambulatory Visit: Payer: Self-pay

## 2017-12-13 DIAGNOSIS — E038 Other specified hypothyroidism: Secondary | ICD-10-CM

## 2017-12-13 MED ORDER — LEVOTHYROXINE SODIUM 50 MCG PO TABS: 50.0000 ug | ORAL_TABLET | Freq: Every day | ORAL | 0 refills | Status: DC

## 2017-12-24 DIAGNOSIS — L538 Other specified erythematous conditions: Secondary | ICD-10-CM | POA: Diagnosis not present

## 2017-12-24 DIAGNOSIS — L820 Inflamed seborrheic keratosis: Secondary | ICD-10-CM | POA: Diagnosis not present

## 2017-12-24 DIAGNOSIS — L739 Follicular disorder, unspecified: Secondary | ICD-10-CM | POA: Diagnosis not present

## 2017-12-24 DIAGNOSIS — L728 Other follicular cysts of the skin and subcutaneous tissue: Secondary | ICD-10-CM | POA: Diagnosis not present

## 2017-12-24 DIAGNOSIS — L308 Other specified dermatitis: Secondary | ICD-10-CM | POA: Diagnosis not present

## 2018-01-13 ENCOUNTER — Other Ambulatory Visit: Payer: Self-pay

## 2018-01-13 MED ORDER — ASPIRIN EC 81 MG PO TBEC: 81.0000 mg | DELAYED_RELEASE_TABLET | Freq: Once | ORAL | 0 refills | Status: AC

## 2018-01-17 ENCOUNTER — Other Ambulatory Visit: Payer: Self-pay | Admitting: Internal Medicine

## 2018-01-17 MED ORDER — ASPIRIN EC 81 MG PO TBEC: 81.0000 mg | DELAYED_RELEASE_TABLET | Freq: Every day | ORAL | 2 refills | Status: DC

## 2018-01-28 ENCOUNTER — Encounter: Payer: Self-pay | Admitting: Nurse Practitioner

## 2018-01-28 LAB — COLOGUARD: Cologuard: NEGATIVE

## 2018-01-28 NOTE — Progress Notes (Signed)
SCANNED IN COLOGUARD RESULTS.

## 2018-01-30 DIAGNOSIS — R3915 Urgency of urination: Secondary | ICD-10-CM | POA: Diagnosis not present

## 2018-01-30 DIAGNOSIS — R8290 Unspecified abnormal findings in urine: Secondary | ICD-10-CM | POA: Diagnosis not present

## 2018-01-30 DIAGNOSIS — R1032 Left lower quadrant pain: Secondary | ICD-10-CM | POA: Diagnosis not present

## 2018-02-03 ENCOUNTER — Emergency Department
Admission: EM | Admit: 2018-02-03 | Discharge: 2018-02-03 | Disposition: A | Discharge status: Home / Self Care | Payer: BLUE CROSS/BLUE SHIELD | Attending: Emergency Medicine | Admitting: Emergency Medicine

## 2018-02-03 ENCOUNTER — Emergency Department: Payer: BLUE CROSS/BLUE SHIELD

## 2018-02-03 ENCOUNTER — Other Ambulatory Visit: Payer: Self-pay

## 2018-02-03 ENCOUNTER — Encounter: Payer: Self-pay | Admitting: Emergency Medicine

## 2018-02-03 DIAGNOSIS — Y939 Activity, unspecified: Secondary | ICD-10-CM | POA: Diagnosis not present

## 2018-02-03 DIAGNOSIS — X58XXXA Exposure to other specified factors, initial encounter: Secondary | ICD-10-CM | POA: Diagnosis not present

## 2018-02-03 DIAGNOSIS — R1030 Lower abdominal pain, unspecified: Secondary | ICD-10-CM | POA: Insufficient documentation

## 2018-02-03 DIAGNOSIS — E039 Hypothyroidism, unspecified: Secondary | ICD-10-CM | POA: Diagnosis not present

## 2018-02-03 DIAGNOSIS — K5900 Constipation, unspecified: Secondary | ICD-10-CM | POA: Insufficient documentation

## 2018-02-03 DIAGNOSIS — I10 Essential (primary) hypertension: Secondary | ICD-10-CM | POA: Insufficient documentation

## 2018-02-03 DIAGNOSIS — S3992XA Unspecified injury of lower back, initial encounter: Secondary | ICD-10-CM | POA: Diagnosis not present

## 2018-02-03 DIAGNOSIS — Y929 Unspecified place or not applicable: Secondary | ICD-10-CM | POA: Insufficient documentation

## 2018-02-03 DIAGNOSIS — S39012A Strain of muscle, fascia and tendon of lower back, initial encounter: Secondary | ICD-10-CM | POA: Insufficient documentation

## 2018-02-03 DIAGNOSIS — R300 Dysuria: Secondary | ICD-10-CM | POA: Diagnosis not present

## 2018-02-03 DIAGNOSIS — Z7982 Long term (current) use of aspirin: Secondary | ICD-10-CM | POA: Diagnosis not present

## 2018-02-03 DIAGNOSIS — R109 Unspecified abdominal pain: Secondary | ICD-10-CM | POA: Diagnosis not present

## 2018-02-03 DIAGNOSIS — Z79899 Other long term (current) drug therapy: Secondary | ICD-10-CM | POA: Diagnosis not present

## 2018-02-03 DIAGNOSIS — Y999 Unspecified external cause status: Secondary | ICD-10-CM | POA: Insufficient documentation

## 2018-02-03 DIAGNOSIS — M545 Low back pain: Secondary | ICD-10-CM | POA: Diagnosis not present

## 2018-02-03 LAB — URINALYSIS, COMPLETE (UACMP) WITH MICROSCOPIC
BILIRUBIN URINE: NEGATIVE
GLUCOSE, UA: NEGATIVE mg/dL
Hgb urine dipstick: NEGATIVE
KETONES UR: NEGATIVE mg/dL
LEUKOCYTES UA: NEGATIVE
Nitrite: NEGATIVE
PH: 6 (ref 5.0–8.0)
Protein, ur: NEGATIVE mg/dL
Specific Gravity, Urine: 1.008 (ref 1.005–1.030)
WBC, UA: NONE SEEN WBC/hpf (ref 0–5)

## 2018-02-03 MED ORDER — MELOXICAM 15 MG PO TABS: 15.0000 mg | ORAL_TABLET | Freq: Every day | ORAL | 0 refills | Status: DC

## 2018-02-03 MED ORDER — KETOROLAC TROMETHAMINE 30 MG/ML IJ SOLN
30.0000 mg | Freq: Once | INTRAMUSCULAR | Status: AC
  Administered 2018-02-03: 30 mg via INTRAMUSCULAR
  Filled 2018-02-03: qty 1

## 2018-02-03 MED ORDER — METHOCARBAMOL 500 MG PO TABS: 500.0000 mg | ORAL_TABLET | Freq: Four times a day (QID) | ORAL | 0 refills | Status: DC

## 2018-02-03 MED ORDER — ORPHENADRINE CITRATE 30 MG/ML IJ SOLN
60.0000 mg | Freq: Once | INTRAMUSCULAR | Status: AC
  Administered 2018-02-03: 60 mg via INTRAMUSCULAR
  Filled 2018-02-03: qty 2

## 2018-02-03 NOTE — ED Notes (Signed)
Pt ambulatory to POV without difficulty. VSS. NAD. Discharge instructions, RX and follow up reviewed. All questions and concerns addressed.  

## 2018-02-03 NOTE — ED Provider Notes (Signed)
[facility] Emergency Department Provider Note  ____________________________________________  Time seen: Approximately 3:00 PM  I have reviewed the triage vital signs and the nursing notes.   HISTORY  Chief Complaint Back Pain    HPI Judy Graves is a 62 y.o. female who presents the emergency department complaining of lower back pain, radiating to the left side.  Patient reports that she previously had pain several years ago, had an MRI that showed significant lumbar stenosis and disc bulging.  Patient reports that after an initial treatment of medication, she has had no ongoing problems with her lower back.  Patient reports that she had an increase in pain, presented to urgent care in the performed an exam, urinalysis, lab work.  Patient reports that she had some mild bacteria in her urine but otherwise exam and labs are reassuring.  Patient was placed on Cipro for possible UTI causing her lower back pain.  She is been on medication for 4 days with no relief of symptoms.  Patient does report some mild dysuria, mild suprapubic pain with urination.  She denies any hematuria.  No history of nephrolithiasis.  Patient denies any bowel or bladder dysfunction, saddle anesthesia, paresthesias.  Other than Cipro, no medications over-the-counter or prescription for her pain.  Patient also states that she has a significant history of constipation, no significant changes with same but states that she is constipated at this time.  Patient denies any other systemic complaints of headache, neck pain, chest pain, shortness of breath, nausea vomiting, diarrhea.  No fevers or chills.  No radicular symptoms.    Past Medical History:  Diagnosis Date  . Allergy   . Arthritis    in knee  . Chronic constipation   . Chronic insomnia   . GERD (gastroesophageal reflux disease)   . Hemorrhoids   . Hypertension   . Hypothyroidism, adult   . Impingement syndrome of right shoulder     [facility] subacromial bursitis and tendinitis right shoulder  . Lumbar spinal stenosis    Dr. Phyllis Ginger  . Lump or mass in breast    Dr. Jamal Collin evaluated, likely lipoma  . Menopause   . Metabolic syndrome   . Mild carpal tunnel syndrome of right wrist   . Obesity (BMI 35.0-39.9 without comorbidity)   . Proteinuria     Patient Active Problem List   Diagnosis Date Noted  . Encounter for general adult medical examination with abnormal findings 10/23/2017  . Localized superficial swelling, mass, or lump 10/23/2017  . Dysuria 10/23/2017  . Tear of meniscus of left knee 10/14/2015  . Baker's cyst of knee 10/14/2015  . Benign hypertension 11/21/2014  . Chronic constipation 11/21/2014  . Insomnia, persistent 11/21/2014  . Dyslipidemia 11/21/2014  . Gastro-esophageal reflux disease without esophagitis 11/21/2014  . External hemorrhoids 11/21/2014  . Adult hypothyroidism 11/21/2014  . Impingement syndrome of shoulder 11/21/2014  . Menopause 11/21/2014  . Dysmetabolic syndrome 19/14/7829  . Carpal tunnel syndrome 11/21/2014  . Adult BMI 30+ 11/21/2014  . Allergic rhinitis 11/21/2014  . Primary osteoarthritis of both knees 11/21/2014  . Abnormal presence of protein in urine 11/21/2014  . Lumbar canal stenosis 11/21/2014  . Lump or mass in breast 04/13/2008    Past Surgical History:  Procedure Laterality Date  . CARPAL TUNNEL RELEASE Right 2010   Dr. Sabra Heck  . CATARACT EXTRACTION W/PHACO Left 11/02/2015   Procedure: CATARACT EXTRACTION PHACO AND INTRAOCULAR LENS PLACEMENT (IOC) left eye;  Surgeon: Leandrew Koyanagi, MD;  Location: [facility];  Service: Ophthalmology;  Laterality: Left;  . CHONDROPLASTY Left 11/14/2015   Procedure: CHONDROPLASTY;  Surgeon: Dereck Leep, MD;  Location: [facility];  Service: Orthopedics;  Laterality: Left;  . KNEE ARTHROSCOPY WITH LATERAL MENISECTOMY Left 11/14/2015   Procedure: KNEE ARTHROSCOPY WITH LATERAL MENISECTOMY;  Surgeon: Dereck Leep, MD;  Location: [facility];  Service: Orthopedics;  Laterality: Left;  . KNEE ARTHROSCOPY WITH MEDIAL MENISECTOMY Left 11/14/2015   Procedure: KNEE ARTHROSCOPY WITH MEDIAL MENISECTOMY;  Surgeon: Dereck Leep, MD;  Location: [facility];  Service: Orthopedics;  Laterality: Left;  . TONSILLECTOMY    . TUBAL LIGATION      Prior to Admission medications   Medication Sig Start Date End Date Taking? Authorizing Provider  aspirin EC 81 MG tablet Take 1 tablet (81 mg total) by mouth daily. 01/17/18   Lavera Guise, MD  atorvastatin (LIPITOR) 40 MG tablet Take 1 tablet (40 mg total) by mouth daily. 10/21/17   Ronnell Freshwater, NP  chlorthalidone (HYGROTON) 25 MG tablet Take 1 tablet (25 mg total) by mouth daily. 08/14/17   Ronnell Freshwater, NP  EPIPEN 2-PAK 0.3 MG/0.3ML SOAJ injection AS DIRECTED AS DIRECTED INJECTION 2 04/26/15   [provider]  lansoprazole (PREVACID) 30 MG capsule Take 1 capsule (30 mg total) by mouth daily. 04/26/16   Steele Sizer, MD  levothyroxine (SYNTHROID, LEVOTHROID) 50 MCG tablet Take 1 tablet (50 mcg total) by mouth daily. And 2 on Sundays 12/13/17   Boscia, Heather E, NP  meloxicam (MOBIC) 15 MG tablet Take 1 tablet (15 mg total) by mouth daily. 02/03/18   Cuthriell, Jonathan D, PA-C  methocarbamol (ROBAXIN) 500 MG tablet Take 1 tablet (500 mg total) by mouth 4 (four) times daily. 02/03/18   Cuthriell, Jonathan D, PA-C  metoprolol succinate (TOPROL-XL) 50 MG 24 hr tablet Take 1 tablet (50 mg total) by mouth daily. 11/15/17   Boscia, Heather E, NP    Allergies Ace inhibitors  Family History  Problem Relation Age of Onset  . Cancer Mother        Breast  . Diabetes Mother   . Breast cancer Mother   . Hypertension Father   . CVA Father        80 's  . Hypertension Sister   . Hypertension Brother   . Breast cancer Paternal Aunt     Social History Social History   Tobacco Use  . Smoking status: Never Smoker  . Smokeless tobacco: Never Used  Substance  Use Topics  . Alcohol use: No    Alcohol/week: 0.0 standard drinks  . Drug use: No     Review of Systems  Constitutional: No fever/chills Eyes: No visual changes. No discharge ENT: No upper respiratory complaints. Cardiovascular: no chest pain. Respiratory: no cough. No SOB. Gastrointestinal: No abdominal pain.  No nausea, no vomiting.  No diarrhea.  No constipation. Genitourinary: Positive for mild dysuria.  No flank pain.. No hematuria Musculoskeletal: Positive for lower back pain radiating into the left hip Skin: Negative for rash, abrasions, lacerations, ecchymosis. Neurological: Negative for headaches, focal weakness or numbness. 10-point ROS otherwise negative.  ____________________________________________   PHYSICAL EXAM:  VITAL SIGNS: ED Triage Vitals  Enc Vitals Group     BP 02/03/18 1417 (!) 147/72     Pulse Rate 02/03/18 1417 66     Resp 02/03/18 1417 18     Temp 02/03/18 1417 98.7 F (37.1 C)     Temp Source 02/03/18  1417 Oral     SpO2 02/03/18 1417 95 %     Weight 02/03/18 1418 193 lb (87.5 kg)     Height 02/03/18 1418 5\' 7"  (1.702 m)     Head Circumference --      Peak Flow --      Pain Score 02/03/18 1418 8     Pain Loc --      Pain Edu? --      Excl. in [place]? --      Constitutional: Alert and oriented. Well appearing and in no acute distress. Eyes: Conjunctivae are normal. PERRL. EOMI. Head: Atraumatic. Neck: No stridor.  No cervical spine tenderness to palpation.  Cardiovascular: Normal rate, regular rhythm. Normal S1 and S2.  Good peripheral circulation. Respiratory: Normal respiratory effort without tachypnea or retractions. Lungs CTAB. Good air entry to the bases with no decreased or absent breath sounds. Gastrointestinal: Bowel sounds 4 quadrants. Soft and nontender to palpation all 4 quadrants.  No tenderness to palpation of the suprapubic region.. No guarding or rigidity. No palpable masses. No distention. No CVA tenderness.  Musculoskeletal:  Full range of motion to all extremities. No gross deformities appreciated.  Visualization of the lumbar spine reveals no acute abnormality.  Palpation midline reveals minimal tenderness in the lumbar spine.  No specific point tenderness.  No palpable abnormality or step-off.  No tenderness to palpation right-sided paraspinal muscle group or right-sided sciatic notch.  Patient has mild tenderness left-sided paraspinal muscle group as well as left-sided sciatic notch tenderness.  Negative straight leg raise bilaterally.  Dorsalis pedis pulse intact bilateral lower extremities.  Sensation intact and equal bilateral lower extremities. Neurologic:  Normal speech and language. No gross focal neurologic deficits are appreciated.  Skin:  Skin is warm, dry and intact. No rash noted. Psychiatric: Mood and affect are normal. Speech and behavior are normal. Patient exhibits appropriate insight and judgement.   ____________________________________________   LABS (all labs ordered are listed, but only abnormal results are displayed)  Labs Reviewed  URINALYSIS, COMPLETE (UACMP) WITH MICROSCOPIC - Abnormal; Notable for the following components:      Result Value   Color, Urine STRAW (*)    APPearance CLEAR (*)    Bacteria, UA RARE (*)    All other components within normal limits  URINE CULTURE   ____________________________________________  EKG   ____________________________________________  RADIOLOGY I personally viewed and evaluated these images as part of my medical decision making, as well as reviewing the written report by the radiologist.  Concur with degenerative changes in the lumbar spine with no acute findings from previous MRI.  Abdomen film reveals mild constipation, otherwise unremarkable.  Dg Lumbar Spine Complete  Result Date: 02/03/2018 CLINICAL DATA:  Low back pain for several days, no known injury, initial encounter EXAM: LUMBAR SPINE - COMPLETE 4+ VIEW COMPARISON:  04/13/2008  FINDINGS: Five lumbar type vertebral bodies are well visualized. Vertebral body height is well maintained. No pars defects are seen. Multilevel facet hypertrophic changes are noted with associated anterolisthesis of L3 on L4. Multilevel disc space narrowing and osteophytic changes are seen as well. No soft tissue abnormality is noted. IMPRESSION: Multilevel degenerative change with anterolisthesis of L3 on L4. Electronically Signed   By: Inez Catalina M.D.   On: 02/03/2018 15:49   Dg Abdomen 1 View  Result Date: 02/03/2018 CLINICAL DATA:  Abdominal pain EXAM: ABDOMEN - 1 VIEW COMPARISON:  None. FINDINGS: Scattered large and small bowel gas is noted. Mild retained fecal material is noted  without obstructive change. No free air is seen. No abnormal mass or abnormal calcifications are noted. Degenerative changes of the lumbar spine are seen. IMPRESSION: No acute abnormality noted. Electronically Signed   By: Inez Catalina M.D.   On: 02/03/2018 15:49    ____________________________________________    PROCEDURES  Procedure(s) performed:    Procedures    Medications  ketorolac (TORADOL) 30 MG/ML injection 30 mg (has no administration in time range)  orphenadrine (NORFLEX) injection 60 mg (has no administration in time range)     ____________________________________________   INITIAL IMPRESSION / ASSESSMENT AND PLAN / ED COURSE  Pertinent labs & imaging results that were available during my care of the patient were reviewed by me and considered in my medical decision making (see chart for details).  Review of the [place] CSRS was performed in accordance of the [place] prior to dispensing any controlled drugs.  Clinical Course as of Feb 03 1629  Mon Feb 03, 2018  1517 Patient presented to the emergency department complaining of low back pain, radiating into the left hip.  While physical exam findings are most consistent with musculoskeletal complaints, especially given patient's history of  significant lumbar stenosis, bulging disc, patient also has a complaint of chronic constipation as well as dysuria.  Patient was recently seen in urgent care for similar complaint, diagnosed with UTI.  Patient was placed on Cipro but denies any significant improvement of dysuria, suprapubic pain.  No CVA tenderness on exam.  Repeat urinalysis as well as culture will be ordered.  Patient does have a significant history of constipation, states that she no longer takes her daily medication as it caused her to have significant loose stools.  X-ray of the lumbar spine as well as one view abdomen will be ordered.  Differential at this time includes lumbar strain, sciatica, worsening lumbar stenosis, nephrolithiasis, pyelonephritis, ongoing UTI given high resistance to Cipro in this region..   [JC]    Clinical Course User Index [JC] Cuthriell, Charline Bills, PA-C     Patient's diagnosis is consistent with lumbar strain.  Patient presents the emergency department complaining of left-sided lower back pain radiating to the left hip.  Exam was most consistent with musculoskeletal strain, however given recent findings consistent with UTI as well as history of constipation, patient was given further evaluation with urinalysis and x-ray.  Rare bacteria on urinalysis with no white blood cells or nitrites.  Patient is on Cipro and advised to continue same but I feel that symptoms are not likely UTI related.  Patient does have some mild constipation, this is chronic and at baseline per patient.  No indication for aggressive treatment.  Patient will be given Toradol and Norflex injection in the emergency department for likely lumbar strain.. Patient will be discharged home with prescriptions for meloxicam and Robaxin for further symptomatic relief.Patient is to follow up with primary care as needed or otherwise directed. Patient is given ED precautions to return to the ED for any worsening or new  symptoms.     ____________________________________________  FINAL CLINICAL IMPRESSION(S) / ED DIAGNOSES  Final diagnoses:  Strain of lumbar region, initial encounter      NEW MEDICATIONS STARTED DURING THIS VISIT:  ED Discharge Orders         Ordered    meloxicam (MOBIC) 15 MG tablet  Daily     02/03/18 1630    methocarbamol (ROBAXIN) 500 MG tablet  4 times daily     02/03/18 1630  This chart was dictated using voice recognition software/Dragon. Despite best efforts to proofread, errors can occur which can change the meaning. Any change was purely unintentional.    Darletta Moll, PA-C 02/03/18 [place], Randall An, MD 02/03/18 [phone]

## 2018-02-03 NOTE — ED Notes (Signed)
See triage note  Presents with lower back pain which started 3-4 days ago w/o injury

## 2018-02-03 NOTE — ED Triage Notes (Signed)
Low back pain x 4 days.

## 2018-02-05 LAB — URINE CULTURE
Culture: NO GROWTH
SPECIAL REQUESTS: NORMAL

## 2018-02-06 ENCOUNTER — Encounter: Payer: Self-pay | Admitting: Adult Health

## 2018-02-06 ENCOUNTER — Ambulatory Visit: Payer: BLUE CROSS/BLUE SHIELD | Admitting: Adult Health

## 2018-02-06 VITALS — BP 138/80 | HR 80 | Temp 98.2°F | Resp 16 | Ht 67.0 in | Wt 199.0 lb

## 2018-02-06 DIAGNOSIS — M48061 Spinal stenosis, lumbar region without neurogenic claudication: Secondary | ICD-10-CM

## 2018-02-06 DIAGNOSIS — R300 Dysuria: Secondary | ICD-10-CM

## 2018-02-06 LAB — POCT URINALYSIS DIPSTICK
BILIRUBIN UA: 0.2
Blood, UA: NEGATIVE
GLUCOSE UA: NEGATIVE
Ketones, UA: NEGATIVE
Leukocytes, UA: NEGATIVE
Nitrite, UA: NEGATIVE
Protein, UA: NEGATIVE
SPEC GRAV UA: 1.015 (ref 1.010–1.025)
Urobilinogen, UA: 0.2 E.U./dL
pH, UA: 5 (ref 5.0–8.0)

## 2018-02-06 MED ORDER — PREDNISONE 10 MG PO TABS: ORAL_TABLET | ORAL | 0 refills | Status: DC

## 2018-02-06 NOTE — Progress Notes (Addendum)
[facility] [place], [place] [address]  Internal MEDICINE  Office Visit Note  Patient Name: Judy Graves  [id number]  [id number]  Date of Service: 02/10/2018  Chief Complaint  Patient presents with  . Back Pain    lower back /abdominal pain   HPI Pt is here for a sick visit. Pt reports she has been having low back pain x 7 days.  She describes it as pain, and a cramp all the way down her right leg, and a cramping pain in her calf. She reports she has been to acute care where they gave her Cipro for a UTI. She later went to the ER where they gave her meloxicam and methocarbamol after diagnosing her with a lumbar strain. She had an MRI performed in 2015 that reports "1. Severe spinal stenosis at L3-4 and L4-5 with almost complete obliteration of the thecal sac. 2. Slightly less severe spinal stenosis at L2-3 with trapping of the  nerve roots. 3. Marked hypertrophy of the facet joints and ligamentum flavum from L2-3 through L4-5 and to a slightly lesser degree at L5-S1 with left lateral recess stenosis at L5-S1."  She would like to see ortho at this time.     Current Medication:  Outpatient Encounter Medications as of 02/06/2018  Medication Sig  . aspirin EC 81 MG tablet Take 1 tablet (81 mg total) by mouth daily.  Marland Kitchen atorvastatin (LIPITOR) 40 MG tablet Take 1 tablet (40 mg total) by mouth daily.  . chlorthalidone (HYGROTON) 25 MG tablet Take 1 tablet (25 mg total) by mouth daily.  . [EXPIRED] ciprofloxacin (CIPRO) 500 MG tablet Take by mouth.  . EPIPEN 2-PAK 0.3 MG/0.3ML SOAJ injection AS DIRECTED AS DIRECTED INJECTION 2  . lansoprazole (PREVACID) 30 MG capsule Take 1 capsule (30 mg total) by mouth daily.  . meloxicam (MOBIC) 15 MG tablet Take 1 tablet (15 mg total) by mouth daily.  . methocarbamol (ROBAXIN) 500 MG tablet Take 1 tablet (500 mg total) by mouth 4 (four) times daily.  . metoprolol succinate (TOPROL-XL) 50 MG 24 hr tablet Take 1 tablet (50 mg total)  by mouth daily.  . [DISCONTINUED] levothyroxine (SYNTHROID, LEVOTHROID) 50 MCG tablet Take 1 tablet (50 mcg total) by mouth daily. And 2 on Sundays  . predniSONE (DELTASONE) 10 MG tablet Use per dose pack   No facility-administered encounter medications on file as of 02/06/2018.     Medical History: Past Medical History:  Diagnosis Date  . Allergy   . Arthritis    in knee  . Chronic constipation   . Chronic insomnia   . GERD (gastroesophageal reflux disease)   . Hemorrhoids   . Hypertension   . Hypothyroidism, adult   . Impingement syndrome of right shoulder     [facility] subacromial bursitis and tendinitis right shoulder  . Lumbar spinal stenosis    Dr. Phyllis Ginger  . Lump or mass in breast    Dr. Jamal Collin evaluated, likely lipoma  . Menopause   . Metabolic syndrome   . Mild carpal tunnel syndrome of right wrist   . Obesity (BMI 35.0-39.9 without comorbidity)   . Proteinuria    Vital Signs: BP 138/80   Pulse 80   Temp 98.2 F (36.8 C)   Resp 16   Ht 5\' 7"  (1.702 m)   Wt 199 lb (90.3 kg)   SpO2 95%   BMI 31.17 kg/m   Review of Systems  Constitutional: Negative for chills, fatigue and unexpected weight  change.  HENT: Negative for congestion, rhinorrhea, sneezing and sore throat.   Eyes: Negative for photophobia, pain and redness.  Respiratory: Negative for cough, chest tightness and shortness of breath.   Cardiovascular: Negative for chest pain and palpitations.  Gastrointestinal: Negative for abdominal pain, constipation, diarrhea, nausea and vomiting.  Endocrine: Negative.   Genitourinary: Negative for dysuria and frequency.  Musculoskeletal: Negative for arthralgias, back pain, joint swelling and neck pain.  Skin: Negative for rash.  Allergic/Immunologic: Negative.   Neurological: Negative for tremors and numbness.  Hematological: Negative for adenopathy. Does not bruise/bleed easily.  Psychiatric/Behavioral: Negative for behavioral problems and sleep  disturbance. The patient is not nervous/anxious.     Physical Exam  Constitutional: She is oriented to person, place, and time. She appears well-developed and well-nourished. No distress.  HENT:  Head: Normocephalic and atraumatic.  Mouth/Throat: Oropharynx is clear and moist. No oropharyngeal exudate.  Eyes: Pupils are equal, round, and reactive to light. EOM are normal.  Neck: Normal range of motion. Neck supple. No JVD present. No tracheal deviation present. No thyromegaly present.  Cardiovascular: Normal rate, regular rhythm and normal heart sounds. Exam reveals no gallop and no friction rub.  No murmur heard. Pulmonary/Chest: Effort normal and breath sounds normal. No respiratory distress. She has no wheezes. She has no rales. She exhibits no tenderness.  Abdominal: Soft. There is no tenderness. There is no guarding.  Musculoskeletal: Normal range of motion.  Lymphadenopathy:    She has no cervical adenopathy.  Neurological: She is alert and oriented to person, place, and time. No cranial nerve deficit.  Skin: Skin is warm and dry. She is not diaphoretic.  Psychiatric: She has a normal mood and affect. Her behavior is normal. Judgment and thought content normal.  Nursing note and vitals reviewed.  Assessment/Plan: 1. Spinal stenosis at L4-L5 level Use prednisone as discussed.  Follow up with ortho. - Ambulatory referral to Orthopedic Surgery - predniSONE (DELTASONE) 10 MG tablet; Use per dose pack  Dispense: 21 tablet; Refill: 0  2. Dysuria Urine clear today - POCT Urinalysis Dipstick  General Counseling: Davisha verbalizes understanding of the findings of todays visit and agrees with plan of treatment. I have discussed any further diagnostic evaluation that may be needed or ordered today. We also reviewed her medications today. she has been encouraged to call the office with any questions or concerns that should arise related to todays visit.   Orders Placed This Encounter   Procedures  . Ambulatory referral to Orthopedic Surgery  . POCT Urinalysis Dipstick    Meds ordered this encounter  Medications  . predniSONE (DELTASONE) 10 MG tablet    Sig: Use per dose pack    Dispense:  21 tablet    Refill:  0    Time spent: 25 Minutes  This patient was seen by Orson Gear AGNP-C in Collaboration with Dr Lavera Guise as a part of collaborative care agreement

## 2018-02-10 ENCOUNTER — Other Ambulatory Visit: Payer: Self-pay

## 2018-02-10 ENCOUNTER — Other Ambulatory Visit: Payer: Self-pay | Admitting: Adult Health

## 2018-02-10 DIAGNOSIS — E038 Other specified hypothyroidism: Secondary | ICD-10-CM

## 2018-02-10 DIAGNOSIS — I10 Essential (primary) hypertension: Secondary | ICD-10-CM

## 2018-02-10 MED ORDER — CHLORTHALIDONE 25 MG PO TABS: 25.0000 mg | ORAL_TABLET | Freq: Every day | ORAL | 1 refills | Status: DC

## 2018-02-10 MED ORDER — LEVOTHYROXINE SODIUM 50 MCG PO TABS: 50.0000 ug | ORAL_TABLET | Freq: Every day | ORAL | 1 refills | Status: DC

## 2018-02-11 DIAGNOSIS — M5416 Radiculopathy, lumbar region: Secondary | ICD-10-CM | POA: Diagnosis not present

## 2018-02-11 DIAGNOSIS — M24562 Contracture, left knee: Secondary | ICD-10-CM | POA: Diagnosis not present

## 2018-02-19 DIAGNOSIS — M545 Low back pain: Secondary | ICD-10-CM | POA: Diagnosis not present

## 2018-02-19 DIAGNOSIS — M25662 Stiffness of left knee, not elsewhere classified: Secondary | ICD-10-CM | POA: Diagnosis not present

## 2018-02-21 DIAGNOSIS — M25662 Stiffness of left knee, not elsewhere classified: Secondary | ICD-10-CM | POA: Diagnosis not present

## 2018-02-21 DIAGNOSIS — R600 Localized edema: Secondary | ICD-10-CM | POA: Diagnosis not present

## 2018-02-21 DIAGNOSIS — M545 Low back pain: Secondary | ICD-10-CM | POA: Diagnosis not present

## 2018-02-25 ENCOUNTER — Ambulatory Visit (INDEPENDENT_AMBULATORY_CARE_PROVIDER_SITE_OTHER): Payer: BLUE CROSS/BLUE SHIELD | Admitting: Adult Health

## 2018-02-25 ENCOUNTER — Encounter: Payer: Self-pay | Admitting: Adult Health

## 2018-02-25 VITALS — BP 120/80 | HR 85 | Resp 16 | Ht 67.0 in | Wt 196.0 lb

## 2018-02-25 DIAGNOSIS — M48061 Spinal stenosis, lumbar region without neurogenic claudication: Secondary | ICD-10-CM | POA: Diagnosis not present

## 2018-02-25 DIAGNOSIS — M545 Low back pain, unspecified: Secondary | ICD-10-CM

## 2018-02-25 LAB — POCT URINALYSIS DIPSTICK
BILIRUBIN UA: NEGATIVE
GLUCOSE UA: NEGATIVE
Ketones, UA: NEGATIVE
Leukocytes, UA: NEGATIVE
Nitrite, UA: NEGATIVE
Protein, UA: NEGATIVE
Spec Grav, UA: 1.01 (ref 1.010–1.025)
Urobilinogen, UA: 0.2 E.U./dL
pH, UA: 7.5 (ref 5.0–8.0)

## 2018-02-25 NOTE — Patient Instructions (Signed)
Spinal Stenosis Spinal stenosis happens when the open space (spinal canal) between the bones of your spine (vertebrae) gets smaller. It is caused by bone pushing into the open spaces of your backbone (spine). This puts pressure on your backbone and the nerves in your backbone. Treatment often focuses on managing any pain and symptoms. In some cases, surgery may be needed. Follow these instructions at home: Managing pain, stiffness, and swelling  Do all exercises and stretches as told by your doctor.  Stand and sit up straight (use good posture). If you were given a brace or a corset, wear it as told by your doctor.  Do not do any activities that cause pain. Ask your doctor what activities are safe for you.  Do not lift anything that is heavier than 10 lb (4.5 kg) or heavier than your doctor tells you.  Try to stay at a healthy weight. Talk with your doctor if you need help losing weight.  If directed, put heat on the affected area as often as told by your doctor. Use the heat source that your doctor recommends, such as a moist heat pack or a heating pad. ? Put a towel between your skin and the heat source. ? Leave the heat on for 20-30 minutes. ? Remove the heat if your skin turns bright red. This is especially important if you are not able to feel pain, heat, or cold. You may have a greater risk of getting burned. General instructions  Take over-the-counter and prescription medicines only as told by your doctor.  Do not use any products that contain nicotine or tobacco, such as cigarettes and e-cigarettes. If you need help quitting, ask your doctor.  Eat a healthy diet. This includes plenty of fruits and vegetables, whole grains, and low-fat (lean) protein.  Keep all follow-up visits as told by your doctor. This is important. Contact a doctor if:  Your symptoms do not get better.  Your symptoms get worse.  You have a fever. Get help right away if:  You have new or worse pain in  your neck or upper back.  You have very bad pain that medicine does not control.  You are dizzy.  You have vision problems, blurred vision, or double vision.  You have a very bad headache that is worse when you stand.  You feel sick to your stomach (nauseous).  You throw up (vomit).  You have new or worse numbness or tingling in your back or legs.  You have pain, redness, swelling, or warmth in your arm or leg. Summary  Spinal stenosis happens when the open space (spinal canal) between the bones of your spine gets smaller (narrow).  Contact a doctor if your symptoms get worse.  In some cases, surgery may be needed. This information is not intended to replace advice given to you by your health care provider. Make sure you discuss any questions you have with your health care provider. Document Released: 08/17/2010 Document Revised: 03/28/2016 Document Reviewed: 03/28/2016 Elsevier Interactive Patient Education  2017 [employer]  

## 2018-02-25 NOTE — Progress Notes (Signed)
[facility] [place], [place] [address]  Internal MEDICINE  Office Visit Note  Patient Name: Judy Graves  [id number]  [id number]  Date of Service: 02/26/2018  Chief Complaint  Patient presents with  . Abdominal Pain  . Vaginal Pain  . Back Pain     HPI Pt is here for a sick visit.  She reports lower back pain that is not new for her has been going on for over a year.  She is also reporting low abdominal pain/vaginal pain.  Upon further questioning patient reports paresthesia that comes down her right glutes and up into her perineum and the right side of her labia.  She states this is a new symptom that has been going on for just a few weeks.  The numbness initially would come and go but now is constant and never gets better.  She reports she is currently doing physical therapy at the direction of an orthopedist for her lower back and knees.  The numbness and tingling presented itself after starting physical therapy.    Current Medication:  Outpatient Encounter Medications as of 02/25/2018  Medication Sig  . aspirin EC 81 MG tablet Take 1 tablet (81 mg total) by mouth daily.  Marland Kitchen atorvastatin (LIPITOR) 40 MG tablet Take 1 tablet (40 mg total) by mouth daily.  . chlorthalidone (HYGROTON) 25 MG tablet Take 1 tablet (25 mg total) by mouth daily.  Marland Kitchen EPIPEN 2-PAK 0.3 MG/0.3ML SOAJ injection AS DIRECTED AS DIRECTED INJECTION 2  . lansoprazole (PREVACID) 30 MG capsule Take 1 capsule (30 mg total) by mouth daily.  Marland Kitchen levothyroxine (SYNTHROID, LEVOTHROID) 50 MCG tablet Take 1 tablet (50 mcg total) by mouth daily. And 2 on Sundays  . meloxicam (MOBIC) 15 MG tablet Take 1 tablet (15 mg total) by mouth daily.  . methocarbamol (ROBAXIN) 500 MG tablet Take 1 tablet (500 mg total) by mouth 4 (four) times daily.  . metoprolol succinate (TOPROL-XL) 50 MG 24 hr tablet Take 1 tablet (50 mg total) by mouth daily.  . predniSONE (DELTASONE) 10 MG tablet Use per dose pack   No  facility-administered encounter medications on file as of 02/25/2018.       Medical History: Past Medical History:  Diagnosis Date  . Allergy   . Arthritis    in knee  . Chronic constipation   . Chronic insomnia   . GERD (gastroesophageal reflux disease)   . Hemorrhoids   . Hypertension   . Hypothyroidism, adult   . Impingement syndrome of right shoulder    [facility] subacromial bursitis and tendinitis right shoulder  . Lumbar spinal stenosis    Dr. Phyllis Ginger  . Lump or mass in breast    Dr. Jamal Collin evaluated, likely lipoma  . Menopause   . Metabolic syndrome   . Mild carpal tunnel syndrome of right wrist   . Obesity (BMI 35.0-39.9 without comorbidity)   . Proteinuria      Vital Signs: BP 120/80   Pulse 85   Resp 16   Ht 5\' 7"  (1.702 m)   Wt 196 lb (88.9 kg)   SpO2 95%   BMI 30.70 kg/m    Review of Systems  Constitutional: Negative for chills, fatigue and unexpected weight change.  HENT: Negative for congestion, rhinorrhea, sneezing and sore throat.   Eyes: Negative for photophobia, pain and redness.  Respiratory: Negative for cough, chest tightness and shortness of breath.   Cardiovascular: Negative for chest pain and palpitations.  Gastrointestinal:  Negative for abdominal pain, constipation, diarrhea, nausea and vomiting.  Endocrine: Negative.   Genitourinary: Negative for dysuria and frequency.  Musculoskeletal: Negative for arthralgias, back pain, joint swelling and neck pain.  Skin: Negative for rash.  Allergic/Immunologic: Negative.   Neurological: Positive for numbness. Negative for tremors.       Pt has circumflex numbness/tingling from right glute to vaginal area.    Hematological: Negative for adenopathy. Does not bruise/bleed easily.  Psychiatric/Behavioral: Negative for behavioral problems and sleep disturbance. The patient is not nervous/anxious.     Physical Exam  Constitutional: She is oriented to person, place, and time. She appears  well-developed and well-nourished. No distress.  HENT:  Head: Normocephalic and atraumatic.  Mouth/Throat: Oropharynx is clear and moist. No oropharyngeal exudate.  Eyes: Pupils are equal, round, and reactive to light. EOM are normal.  Neck: Normal range of motion. Neck supple. No JVD present. No tracheal deviation present. No thyromegaly present.  Cardiovascular: Normal rate, regular rhythm and normal heart sounds. Exam reveals no gallop and no friction rub.  No murmur heard. Pulmonary/Chest: Effort normal and breath sounds normal. No respiratory distress. She has no wheezes. She has no rales. She exhibits no tenderness.  Abdominal: Soft. There is no tenderness. There is no guarding.  Musculoskeletal: Normal range of motion.  Lymphadenopathy:    She has no cervical adenopathy.  Neurological: She is alert and oriented to person, place, and time. No cranial nerve deficit.  Skin: Skin is warm and dry. She is not diaphoretic.  Psychiatric: She has a normal mood and affect. Her behavior is normal. Judgment and thought content normal.  Nursing note and vitals reviewed.  Assessment/Plan: 1. Spinal stenosis at L4-L5 level Due to patient's previous MRI from 2015 and her newly developed symptom of pelvic numbness, will obtain MRI of the L-spine.  Patient reports the numbness is constant now, she also has pins-and-needles feeling.  Based on MRI findings may refer to neurosurgery.  She is currently seeing orthopedics and doing physical therapy, which she is having difficulty doing now that she has this numbness and tingling. - MR Lumbar Spine Wo Contrast; Future  2. Low back pain without sciatica, unspecified back pain laterality, unspecified chronicity Patient's urine dipstick shows trace blood.  Will send for culture, awaiting results. - POCT Urinalysis Dipstick - CULTURE, URINE COMPREHENSIVE - MR Lumbar Spine Wo Contrast; Future  General Counseling: Judy Graves verbalizes understanding of the  findings of todays visit and agrees with plan of treatment. I have discussed any further diagnostic evaluation that may be needed or ordered today. We also reviewed her medications today. she has been encouraged to call the office with any questions or concerns that should arise related to todays visit.   Orders Placed This Encounter  Procedures  . CULTURE, URINE COMPREHENSIVE  . MR Lumbar Spine Wo Contrast  . POCT Urinalysis Dipstick    No orders of the defined types were placed in this encounter.   Time spent: 25 Minutes  This patient was seen by Orson Gear AGNP-C in Collaboration with Dr Lavera Guise as a part of collaborative care agreement.  Kendell Bane AGNP-C Internal Medicine

## 2018-02-26 DIAGNOSIS — R600 Localized edema: Secondary | ICD-10-CM | POA: Diagnosis not present

## 2018-02-26 DIAGNOSIS — M545 Low back pain: Secondary | ICD-10-CM | POA: Diagnosis not present

## 2018-02-26 DIAGNOSIS — M25662 Stiffness of left knee, not elsewhere classified: Secondary | ICD-10-CM | POA: Diagnosis not present

## 2018-02-28 ENCOUNTER — Ambulatory Visit: Payer: Self-pay | Admitting: Adult Health

## 2018-02-28 DIAGNOSIS — M545 Low back pain: Secondary | ICD-10-CM | POA: Diagnosis not present

## 2018-02-28 DIAGNOSIS — M25662 Stiffness of left knee, not elsewhere classified: Secondary | ICD-10-CM | POA: Diagnosis not present

## 2018-02-28 DIAGNOSIS — M5416 Radiculopathy, lumbar region: Secondary | ICD-10-CM | POA: Diagnosis not present

## 2018-02-28 LAB — CULTURE, URINE COMPREHENSIVE

## 2018-03-04 ENCOUNTER — Other Ambulatory Visit: Payer: Self-pay | Admitting: Adult Health

## 2018-03-04 MED ORDER — LINACLOTIDE 145 MCG PO CAPS: 145.0000 ug | ORAL_CAPSULE | Freq: Every day | ORAL | 3 refills | Status: DC

## 2018-03-05 DIAGNOSIS — M25662 Stiffness of left knee, not elsewhere classified: Secondary | ICD-10-CM | POA: Diagnosis not present

## 2018-03-05 DIAGNOSIS — M545 Low back pain: Secondary | ICD-10-CM | POA: Diagnosis not present

## 2018-03-07 DIAGNOSIS — M545 Low back pain: Secondary | ICD-10-CM | POA: Diagnosis not present

## 2018-03-07 DIAGNOSIS — M25662 Stiffness of left knee, not elsewhere classified: Secondary | ICD-10-CM | POA: Diagnosis not present

## 2018-03-12 DIAGNOSIS — M25662 Stiffness of left knee, not elsewhere classified: Secondary | ICD-10-CM | POA: Diagnosis not present

## 2018-03-12 DIAGNOSIS — M545 Low back pain: Secondary | ICD-10-CM | POA: Diagnosis not present

## 2018-03-13 ENCOUNTER — Telehealth: Payer: Self-pay

## 2018-03-13 NOTE — Telephone Encounter (Signed)
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 She is wanting a note for work so that she will not lose her job and that she is allowed to collect her short term disability while she is out of work. [place] per Christin Bach pt that the letter will be available on Tuesday and she stated that she is suppose to return to work on Monday if we could get it ready before then that it would help her a lot.

## 2018-03-14 DIAGNOSIS — M545 Low back pain: Secondary | ICD-10-CM | POA: Diagnosis not present

## 2018-03-14 DIAGNOSIS — M25662 Stiffness of left knee, not elsewhere classified: Secondary | ICD-10-CM | POA: Diagnosis not present

## 2018-03-19 DIAGNOSIS — M25662 Stiffness of left knee, not elsewhere classified: Secondary | ICD-10-CM | POA: Diagnosis not present

## 2018-03-19 DIAGNOSIS — M545 Low back pain: Secondary | ICD-10-CM | POA: Diagnosis not present

## 2018-03-21 DIAGNOSIS — M25662 Stiffness of left knee, not elsewhere classified: Secondary | ICD-10-CM | POA: Diagnosis not present

## 2018-03-21 DIAGNOSIS — M545 Low back pain: Secondary | ICD-10-CM | POA: Diagnosis not present

## 2018-03-24 ENCOUNTER — Telehealth: Payer: Self-pay

## 2018-03-24 NOTE — Telephone Encounter (Signed)
Patient employee had FMLA paperwork faxed to our office, we faxed back requesting patient have completed with Orthopedics due to we referred her out due to the condition needed ortho consult and are more involved with this condition they will need to review and complete paperwork.Judy Graves

## 2018-03-25 DIAGNOSIS — M5416 Radiculopathy, lumbar region: Secondary | ICD-10-CM | POA: Diagnosis not present

## 2018-03-25 DIAGNOSIS — M545 Low back pain: Secondary | ICD-10-CM | POA: Diagnosis not present

## 2018-04-08 ENCOUNTER — Other Ambulatory Visit: Payer: Self-pay

## 2018-04-08 MED ORDER — LANSOPRAZOLE 30 MG PO CPDR: 30.0000 mg | DELAYED_RELEASE_CAPSULE | Freq: Every day | ORAL | 1 refills | Status: DC

## 2018-04-09 ENCOUNTER — Encounter: Payer: Self-pay | Admitting: Adult Health

## 2018-04-09 ENCOUNTER — Ambulatory Visit (INDEPENDENT_AMBULATORY_CARE_PROVIDER_SITE_OTHER): Payer: BLUE CROSS/BLUE SHIELD | Admitting: Adult Health

## 2018-04-09 VITALS — BP 128/84 | HR 102 | Resp 16 | Ht 67.0 in | Wt 200.0 lb

## 2018-04-09 DIAGNOSIS — R000 Tachycardia, unspecified: Secondary | ICD-10-CM | POA: Diagnosis not present

## 2018-04-09 DIAGNOSIS — M545 Low back pain, unspecified: Secondary | ICD-10-CM

## 2018-04-09 DIAGNOSIS — M48061 Spinal stenosis, lumbar region without neurogenic claudication: Secondary | ICD-10-CM | POA: Diagnosis not present

## 2018-04-09 NOTE — Progress Notes (Signed)
[facility] [place],  [address]  Internal MEDICINE  Office Visit Note  Patient Name: Judy Graves  [id number]  [id number]  Date of Service: 04/09/2018  Chief Complaint  Patient presents with  . Back Pain    no change , numbness down the side not as bad     HPI Pt is here for follow up on back pain, numbness and paraesthesia down right leg.  Patient reports some intermittent numbness and tingling on her left leg as well.  She does report her back pain is centered in her lower back and radiates down her legs at times.  The patient continues to be treated by [facility].  She has cleated 4 weeks of physical therapy and is now awaiting insurance approval of her MRI.  Patient remains out of work at this time due to severe pain as well as paresthesia with standing or walking.  Based upon what ever the MRI results are patient still may need neurosurgery referral as a previous lumbar spine x-ray showed multilevel degenerative changes in L3 and L4.    Current Medication: Outpatient Encounter Medications as of 04/09/2018  Medication Sig  . aspirin EC 81 MG tablet Take 1 tablet (81 mg total) by mouth daily.  Marland Kitchen atorvastatin (LIPITOR) 40 MG tablet Take 1 tablet (40 mg total) by mouth daily.  . chlorthalidone (HYGROTON) 25 MG tablet Take 1 tablet (25 mg total) by mouth daily.  Marland Kitchen EPIPEN 2-PAK 0.3 MG/0.3ML SOAJ injection AS DIRECTED AS DIRECTED INJECTION 2  . lansoprazole (PREVACID) 30 MG capsule Take 1 capsule (30 mg total) by mouth daily.  Marland Kitchen levothyroxine (SYNTHROID, LEVOTHROID) 50 MCG tablet Take 1 tablet (50 mcg total) by mouth daily. And 2 on Sundays  . linaclotide (LINZESS) 145 MCG CAPS capsule Take 1 capsule (145 mcg total) by mouth daily before breakfast.  . meloxicam (MOBIC) 15 MG tablet Take 1 tablet (15 mg total) by mouth daily.  . methocarbamol (ROBAXIN) 500 MG tablet Take 1 tablet (500 mg total) by mouth 4 (four) times daily.  . metoprolol  succinate (TOPROL-XL) 50 MG 24 hr tablet Take 1 tablet (50 mg total) by mouth daily.  . predniSONE (DELTASONE) 10 MG tablet Use per dose pack   No facility-administered encounter medications on file as of 04/09/2018.     Surgical History: Past Surgical History:  Procedure Laterality Date  . CARPAL TUNNEL RELEASE Right 2010   Dr. Sabra Heck  . CATARACT EXTRACTION W/PHACO Left 11/02/2015   Procedure: CATARACT EXTRACTION PHACO AND INTRAOCULAR LENS PLACEMENT ([place]) left eye;  Surgeon: Leandrew Koyanagi, MD;  Location: [place];  Service: Ophthalmology;  Laterality: Left;  . CHONDROPLASTY Left 11/14/2015   Procedure: CHONDROPLASTY;  Surgeon: Dereck Leep, MD;  Location: [facility];  Service: Orthopedics;  Laterality: Left;  . KNEE ARTHROSCOPY WITH LATERAL MENISECTOMY Left 11/14/2015   Procedure: KNEE ARTHROSCOPY WITH LATERAL MENISECTOMY;  Surgeon: Dereck Leep, MD;  Location: [facility];  Service: Orthopedics;  Laterality: Left;  . KNEE ARTHROSCOPY WITH MEDIAL MENISECTOMY Left 11/14/2015   Procedure: KNEE ARTHROSCOPY WITH MEDIAL MENISECTOMY;  Surgeon: Dereck Leep, MD;  Location: [facility];  Service: Orthopedics;  Laterality: Left;  . TONSILLECTOMY    . TUBAL LIGATION      Medical History: Past Medical History:  Diagnosis Date  . Allergy   . Arthritis    in knee  . Chronic constipation   . Chronic insomnia   . GERD (gastroesophageal reflux disease)   .  Hemorrhoids   . Hypertension   . Hypothyroidism, adult   . Impingement syndrome of right shoulder    [facility] subacromial bursitis and tendinitis right shoulder  . Lumbar spinal stenosis    Dr. Phyllis Ginger  . Lump or mass in breast    Dr. Jamal Collin evaluated, likely lipoma  . Menopause   . Metabolic syndrome   . Mild carpal tunnel syndrome of right wrist   . Obesity (BMI 35.0-39.9 without comorbidity)   . Proteinuria     Family History: Family History  Problem Relation Age of Onset  . Cancer Mother        Breast  .  Diabetes Mother   . Breast cancer Mother   . Hypertension Father   . CVA Father        2's  . Hypertension Sister   . Hypertension Brother   . Breast cancer Paternal Aunt     Social History   Socioeconomic History  . Marital status: Married    Spouse name: Not on file  . Number of children: Not on file  . Years of education: Not on file  . Highest education level: Not on file  Occupational History  . Not on file  Social Needs  . Financial resource strain: Not on file  . Food insecurity:    Worry: Not on file    Inability: Not on file  . Transportation needs:    Medical: Not on file    Non-medical: Not on file  Tobacco Use  . Smoking status: Never Smoker  . Smokeless tobacco: Never Used  Substance and Sexual Activity  . Alcohol use: No    Alcohol/week: 0.0 standard drinks  . Drug use: No  . Sexual activity: Yes    Partners: Male  Lifestyle  . Physical activity:    Days per week: Not on file    Minutes per session: Not on file  . Stress: Not on file  Relationships  . Social connections:    Talks on phone: Not on file    Gets together: Not on file    Attends religious service: Not on file    Active member of club or organization: Not on file    Attends meetings of clubs or organizations: Not on file    Relationship status: Not on file  . Intimate partner violence:    Fear of current or ex partner: Not on file    Emotionally abused: Not on file    Physically abused: Not on file    Forced sexual activity: Not on file  Other Topics Concern  . Not on file  Social History Narrative  . Not on file      Review of Systems  Constitutional: Negative for chills, fatigue and unexpected weight change.  HENT: Negative for congestion, rhinorrhea, sneezing and sore throat.   Eyes: Negative for photophobia, pain and redness.  Respiratory: Negative for cough, chest tightness and shortness of breath.   Cardiovascular: Negative for chest pain and palpitations.   Gastrointestinal: Negative for abdominal pain, constipation, diarrhea, nausea and vomiting.  Endocrine: Negative.   Genitourinary: Negative for dysuria and frequency.  Musculoskeletal: Negative for arthralgias, back pain, joint swelling and neck pain.  Skin: Negative for rash.  Allergic/Immunologic: Negative.   Neurological: Negative for tremors and numbness.       Numbness and tingling to bilateral lower extremities.  Patient also reports numbness in her vaginal area.  Of which is exasperated by standing or walking.  Hematological: Negative  for adenopathy. Does not bruise/bleed easily.  Psychiatric/Behavioral: Negative for behavioral problems and sleep disturbance. The patient is not nervous/anxious.     Vital Signs: BP 128/84 (BP Location: Left Arm, Patient Position: Sitting, Cuff Size: Normal)   Pulse (!) 102   Resp 16   Ht 5\' 7"  (1.702 m)   Wt 200 lb (90.7 kg)   SpO2 98%   BMI 31.32 kg/m    Physical Exam  Constitutional: She is oriented to person, place, and time. She appears well-developed and well-nourished. No distress.  HENT:  Head: Normocephalic and atraumatic.  Mouth/Throat: Oropharynx is clear and moist. No oropharyngeal exudate.  Eyes: Pupils are equal, round, and reactive to light. EOM are normal.  Neck: Normal range of motion. Neck supple. No JVD present. No tracheal deviation present. No thyromegaly present.  Cardiovascular: Normal rate, regular rhythm and normal heart sounds. Exam reveals no gallop and no friction rub.  No murmur heard. Pulmonary/Chest: Effort normal and breath sounds normal. No respiratory distress. She has no wheezes. She has no rales. She exhibits no tenderness.  Abdominal: Soft. There is no tenderness. There is no guarding.  Musculoskeletal: Normal range of motion.  Intermittent, numbness and tingling to left leg. Constant numbness and tingling to right leg and pelvic area.  Lymphadenopathy:    She has no cervical adenopathy.   Neurological: She is alert and oriented to person, place, and time. No cranial nerve deficit.  Skin: Skin is warm and dry. She is not diaphoretic.  Psychiatric: She has a normal mood and affect. Her behavior is normal. Judgment and thought content normal.  Nursing note and vitals reviewed.   Assessment/Plan: 1. Spinal stenosis at L4-L5 level Patient continues to have low back pain that radiates down both legs.  She reports a constant numbness and tingling in her pelvic girdle as well as down her right leg.  She states that the numbness and tingling in those areas is constant however on her left leg she reports intermittent numbness and tingling.  2. Low back pain without sciatica, unspecified back pain laterality, unspecified chronicity Encourage patient continue to rest and try not to overuse her lower back by walking or standing too much.  Patient continues to wait for MRI approval from insurance company and hopefully diagnostics can be evaluated from the point.  3. Tachycardia Most likely due to pain, will continue to follow in the future  General Counseling: Maythe verbalizes understanding of the findings of todays visit and agrees with plan of treatment. I have discussed any further diagnostic evaluation that may be needed or ordered today. We also reviewed her medications today. she has been encouraged to call the office with any questions or concerns that should arise related to todays visit.    No orders of the defined types were placed in this encounter.   No orders of the defined types were placed in this encounter.   Time spent: 25 Minutes   This patient was seen by Orson Gear AGNP-C in Collaboration with Dr Lavera Guise as a part of collaborative care agreement     Kendell Bane AGNP-C Internal medicine

## 2018-04-10 ENCOUNTER — Telehealth: Payer: Self-pay

## 2018-04-10 NOTE — Telephone Encounter (Signed)
I faxed patient's records to the number for short term disability, I faxed the last 3 office notes from our office per patient request, if any additional information is needed they will need to send forms. Beth

## 2018-04-21 ENCOUNTER — Other Ambulatory Visit: Payer: Self-pay

## 2018-04-21 ENCOUNTER — Ambulatory Visit: Payer: Self-pay | Admitting: Nurse Practitioner

## 2018-04-21 DIAGNOSIS — E785 Hyperlipidemia, unspecified: Secondary | ICD-10-CM

## 2018-04-21 MED ORDER — ATORVASTATIN CALCIUM 40 MG PO TABS: 40.0000 mg | ORAL_TABLET | Freq: Every day | ORAL | 1 refills | Status: DC

## 2018-04-28 DIAGNOSIS — M545 Low back pain: Secondary | ICD-10-CM | POA: Diagnosis not present

## 2018-05-09 ENCOUNTER — Encounter: Payer: Self-pay | Admitting: Adult Health

## 2018-05-09 ENCOUNTER — Ambulatory Visit: Payer: BC Managed Care – PPO | Admitting: Adult Health

## 2018-05-09 VITALS — BP 139/79 | HR 80 | Resp 16 | Ht 67.0 in | Wt 204.6 lb

## 2018-05-09 DIAGNOSIS — E038 Other specified hypothyroidism: Secondary | ICD-10-CM

## 2018-05-09 DIAGNOSIS — I10 Essential (primary) hypertension: Secondary | ICD-10-CM

## 2018-05-09 DIAGNOSIS — M79606 Pain in leg, unspecified: Secondary | ICD-10-CM

## 2018-05-09 DIAGNOSIS — M48061 Spinal stenosis, lumbar region without neurogenic claudication: Secondary | ICD-10-CM

## 2018-05-09 DIAGNOSIS — M545 Low back pain: Secondary | ICD-10-CM

## 2018-05-09 MED ORDER — LEVOTHYROXINE SODIUM 50 MCG PO TABS: 50.0000 ug | ORAL_TABLET | Freq: Every day | ORAL | 1 refills | Status: DC

## 2018-05-09 MED ORDER — METOPROLOL SUCCINATE ER 50 MG PO TB24: 50.0000 mg | ORAL_TABLET | Freq: Every day | ORAL | 1 refills | Status: DC

## 2018-05-09 MED ORDER — CHLORTHALIDONE 25 MG PO TABS: 25.0000 mg | ORAL_TABLET | Freq: Every day | ORAL | 1 refills | Status: DC

## 2018-05-09 NOTE — Progress Notes (Signed)
[facility] [place], [place] [address]  Internal MEDICINE  Office Visit Note  Patient Name: Judy Graves  [id number]  [id number]  Date of Service: 05/10/2018  Chief Complaint  Patient presents with  . Follow-up    HPI Patient is here for follow-up on lower back pain that radiates down her right leg.  Since her last visit the patient has received an MRI.  Those results are not available to me as they were ordered outside of our charting system, and we had not received a report yet.  Patient reports she has a follow-up appointment on January 20 with orthopedics who ordered the MRI and she will see results then. She continues to report pain in her lower back that radiates down her right leg.  She reports minimal relief with over-the-counter medications and rest.  She reports that walking or standing for more than 10 minutes gives her extreme pain and forces her to sit down.     Current Medication: Outpatient Encounter Medications as of 05/09/2018  Medication Sig  . aspirin EC 81 MG tablet Take 1 tablet (81 mg total) by mouth daily.  Marland Kitchen atorvastatin (LIPITOR) 40 MG tablet Take 1 tablet (40 mg total) by mouth daily.  . chlorthalidone (HYGROTON) 25 MG tablet Take 1 tablet (25 mg total) by mouth daily.  Marland Kitchen EPIPEN 2-PAK 0.3 MG/0.3ML SOAJ injection AS DIRECTED AS DIRECTED INJECTION 2  . lansoprazole (PREVACID) 30 MG capsule Take 1 capsule (30 mg total) by mouth daily.  Marland Kitchen levothyroxine (SYNTHROID, LEVOTHROID) 50 MCG tablet Take 1 tablet (50 mcg total) by mouth daily. And 2 on Sundays  . linaclotide (LINZESS) 145 MCG CAPS capsule Take 1 capsule (145 mcg total) by mouth daily before breakfast.  . meloxicam (MOBIC) 15 MG tablet Take 1 tablet (15 mg total) by mouth daily.  . methocarbamol (ROBAXIN) 500 MG tablet Take 1 tablet (500 mg total) by mouth 4 (four) times daily.  . metoprolol succinate (TOPROL-XL) 50 MG 24 hr tablet Take 1 tablet (50 mg total) by mouth daily.  .  predniSONE (DELTASONE) 10 MG tablet Use per dose pack  . [DISCONTINUED] chlorthalidone (HYGROTON) 25 MG tablet Take 1 tablet (25 mg total) by mouth daily.  . [DISCONTINUED] levothyroxine (SYNTHROID, LEVOTHROID) 50 MCG tablet Take 1 tablet (50 mcg total) by mouth daily. And 2 on Sundays  . [DISCONTINUED] metoprolol succinate (TOPROL-XL) 50 MG 24 hr tablet Take 1 tablet (50 mg total) by mouth daily.   No facility-administered encounter medications on file as of 05/09/2018.     Surgical History: Past Surgical History:  Procedure Laterality Date  . CARPAL TUNNEL RELEASE Right 2010   Dr. Sabra Heck  . CATARACT EXTRACTION W/PHACO Left 11/02/2015   Procedure: CATARACT EXTRACTION PHACO AND INTRAOCULAR LENS PLACEMENT ([employer]) left eye;  Surgeon: Leandrew Koyanagi, MD;  Location: [place];  Service: Ophthalmology;  Laterality: Left;  . CHONDROPLASTY Left 11/14/2015   Procedure: CHONDROPLASTY;  Surgeon: Dereck Leep, MD;  Location: [facility];  Service: Orthopedics;  Laterality: Left;  . KNEE ARTHROSCOPY WITH LATERAL MENISECTOMY Left 11/14/2015   Procedure: KNEE ARTHROSCOPY WITH LATERAL MENISECTOMY;  Surgeon: Dereck Leep, MD;  Location: [facility];  Service: Orthopedics;  Laterality: Left;  . KNEE ARTHROSCOPY WITH MEDIAL MENISECTOMY Left 11/14/2015   Procedure: KNEE ARTHROSCOPY WITH MEDIAL MENISECTOMY;  Surgeon: Dereck Leep, MD;  Location: [facility];  Service: Orthopedics;  Laterality: Left;  . TONSILLECTOMY    . TUBAL LIGATION  Medical History: Past Medical History:  Diagnosis Date  . Allergy   . Arthritis    in knee  . Chronic constipation   . Chronic insomnia   . GERD (gastroesophageal reflux disease)   . Hemorrhoids   . Hypertension   . Hypothyroidism, adult   . Impingement syndrome of right shoulder    [facility] subacromial bursitis and tendinitis right shoulder  . Lumbar spinal stenosis    Dr. Phyllis Ginger  . Lump or mass in breast    Dr. Jamal Collin evaluated, likely lipoma   . Menopause   . Metabolic syndrome   . Mild carpal tunnel syndrome of right wrist   . Obesity (BMI 35.0-39.9 without comorbidity)   . Proteinuria     Family History: Family History  Problem Relation Age of Onset  . Cancer Mother        Breast  . Diabetes Mother   . Breast cancer Mother   . Hypertension Father   . CVA Father        6's  . Hypertension Sister   . Hypertension Brother   . Breast cancer Paternal Aunt     Social History   Socioeconomic History  . Marital status: Married    Spouse name: Not on file  . Number of children: Not on file  . Years of education: Not on file  . Highest education level: Not on file  Occupational History  . Not on file  Social Needs  . Financial resource strain: Not on file  . Food insecurity:    Worry: Not on file    Inability: Not on file  . Transportation needs:    Medical: Not on file    Non-medical: Not on file  Tobacco Use  . Smoking status: Never Smoker  . Smokeless tobacco: Never Used  Substance and Sexual Activity  . Alcohol use: No    Alcohol/week: 0.0 standard drinks  . Drug use: No  . Sexual activity: Yes    Partners: Male  Lifestyle  . Physical activity:    Days per week: Not on file    Minutes per session: Not on file  . Stress: Not on file  Relationships  . Social connections:    Talks on phone: Not on file    Gets together: Not on file    Attends religious service: Not on file    Active member of club or organization: Not on file    Attends meetings of clubs or organizations: Not on file    Relationship status: Not on file  . Intimate partner violence:    Fear of current or ex partner: Not on file    Emotionally abused: Not on file    Physically abused: Not on file    Forced sexual activity: Not on file  Other Topics Concern  . Not on file  Social History Narrative  . Not on file      Review of Systems  Constitutional: Negative for chills, fatigue and unexpected weight change.  HENT:  Negative for congestion, rhinorrhea, sneezing and sore throat.   Eyes: Negative for photophobia, pain and redness.  Respiratory: Negative for cough, chest tightness and shortness of breath.   Cardiovascular: Negative for chest pain and palpitations.  Gastrointestinal: Negative for abdominal pain, constipation, diarrhea, nausea and vomiting.  Endocrine: Negative.   Genitourinary: Negative for dysuria and frequency.  Musculoskeletal: Negative for arthralgias, back pain, joint swelling and neck pain.  Skin: Negative for rash.  Allergic/Immunologic: Negative.   Neurological:  Positive for weakness. Negative for tremors and numbness.       Pain from lower back that radiates down right leg.  Hematological: Negative for adenopathy. Does not bruise/bleed easily.  Psychiatric/Behavioral: Negative for behavioral problems and sleep disturbance. The patient is not nervous/anxious.     Vital Signs: BP 139/79   Pulse 80   Resp 16   Ht 5\' 7"  (1.702 m)   Wt 204 lb 9.6 oz (92.8 kg)   SpO2 95%   BMI 32.04 kg/m    Physical Exam Vitals signs and nursing note reviewed.  Constitutional:      General: She is not in acute distress.    Appearance: She is well-developed. She is not diaphoretic.  HENT:     Head: Normocephalic and atraumatic.     Mouth/Throat:     Pharynx: No oropharyngeal exudate.  Eyes:     Pupils: Pupils are equal, round, and reactive to light.  Neck:     Musculoskeletal: Normal range of motion and neck supple.     Thyroid: No thyromegaly.     Vascular: No JVD.     Trachea: No tracheal deviation.  Cardiovascular:     Rate and Rhythm: Normal rate and regular rhythm.     Heart sounds: Normal heart sounds. No murmur. No friction rub. No gallop.   Pulmonary:     Effort: Pulmonary effort is normal. No respiratory distress.     Breath sounds: Normal breath sounds. No wheezing or rales.  Chest:     Chest wall: No tenderness.  Abdominal:     Palpations: Abdomen is soft.      Tenderness: There is no abdominal tenderness. There is no guarding.  Musculoskeletal: Normal range of motion.  Lymphadenopathy:     Cervical: No cervical adenopathy.  Skin:    General: Skin is warm and dry.  Neurological:     Mental Status: She is alert and oriented to person, place, and time.     Cranial Nerves: No cranial nerve deficit.  Psychiatric:        Behavior: Behavior normal.        Thought Content: Thought content normal.        Judgment: Judgment normal.    Assessment/Plan: 1. Spinal stenosis at L4-L5 level Due to patient's ongoing low back pain that is radiating down her legs, plan for her is to continue to rest out of work until she can follow-up with neurosurgeon on 20 January.  She should continue using her medications as prescribed.  2. Low back pain radiating down leg Patient's low back pain radiating down right leg likely due to spinal stenosis.  She will follow-up with neurosurgery on January 20 but meantime should stay off her feet as much as possible take her medications as prescribed and not walk or stand for more than 5 to 10 minutes.  3. Benign hypertension Refilled patient's metoprolol and [place]. - metoprolol succinate (TOPROL-XL) 50 MG 24 hr tablet; Take 1 tablet (50 mg total) by mouth daily.  Dispense: 90 tablet; Refill: 1 - chlorthalidone (HYGROTON) 25 MG tablet; Take 1 tablet (25 mg total) by mouth daily.  Dispense: 90 tablet; Refill: 1  4. Other specified hypothyroidism Refill patient's Synthroid at this visit. - levothyroxine (SYNTHROID, LEVOTHROID) 50 MCG tablet; Take 1 tablet (50 mcg total) by mouth daily. And 2 on Sundays  Dispense: 102 tablet; Refill: 1  General Counseling: Shariah verbalizes understanding of the findings of todays visit and agrees with plan of treatment. I have  discussed any further diagnostic evaluation that may be needed or ordered today. We also reviewed her medications today. she has been encouraged to call the office with any  questions or concerns that should arise related to todays visit.    No orders of the defined types were placed in this encounter.   Meds ordered this encounter  Medications  . metoprolol succinate (TOPROL-XL) 50 MG 24 hr tablet    Sig: Take 1 tablet (50 mg total) by mouth daily.    Dispense:  90 tablet    Refill:  1  . levothyroxine (SYNTHROID, LEVOTHROID) 50 MCG tablet    Sig: Take 1 tablet (50 mcg total) by mouth daily. And 2 on Sundays    Dispense:  102 tablet    Refill:  1    Patient must keep upcoming appointment with Dr. Ancil Boozer for refills.  . chlorthalidone (HYGROTON) 25 MG tablet    Sig: Take 1 tablet (25 mg total) by mouth daily.    Dispense:  90 tablet    Refill:  1    Increase potassium intake with diet    Time spent: 25 Minutes   This patient was seen by Orson Gear AGNP-C in Collaboration with Dr Lavera Guise as a part of collaborative care agreement     Kendell Bane AGNP-C Internal medicine

## 2018-05-10 ENCOUNTER — Encounter: Payer: Self-pay | Admitting: Adult Health

## 2018-05-12 ENCOUNTER — Telehealth: Payer: Self-pay | Admitting: Adult Health

## 2018-05-12 NOTE — Telephone Encounter (Signed)
Faxed last patient office note to [employer] disability claim # [id number]. Beth

## 2018-05-13 ENCOUNTER — Telehealth: Payer: Self-pay

## 2018-05-13 ENCOUNTER — Other Ambulatory Visit: Payer: Self-pay

## 2018-05-13 NOTE — Telephone Encounter (Signed)
DISCONTINUED LINZESS ON PT CHART PER HEATHER BECAUSE PT CANNOT AFFORD MEDICATION.

## 2018-05-13 NOTE — Telephone Encounter (Signed)
PT CALLED AND SAID LINZESS IS TOO EXPENSIVE FOR HER. I ASKED HER IF SHE WANTS TO TRY AN ALTERNATIVE AND SHE SAID THAT IS OKAY.   SPOKE WITH HEATHER AND SHE SAID WE CAN GIVE HER AMITIZA 8 MCG BID, BUT UNSURE WHETHER THIS WOULD BE A CHEAPER ALTERNATIVE.  CALLED PT BACK AND TOLD HER OF THE ABOVE AND ADVISED HER THAT SHE CALL HER INSURANCE TO SEE IF AMITIZA IS COVERED AND TO GIVE [place] A CALL BACK IF IT IS AND WE CAN GIVE HER SOME SAMPLES TO SEE IF THEY WORK. IF THE AMITIZA SAMPLES WORK WE CAN SEND IT IN TO HER PHARMACY. ADVISED PT TO GIVE [place] A CALL BACK.

## 2018-05-14 ENCOUNTER — Encounter: Payer: Self-pay | Admitting: Adult Health

## 2018-05-26 DIAGNOSIS — G894 Chronic pain syndrome: Secondary | ICD-10-CM | POA: Diagnosis not present

## 2018-05-26 DIAGNOSIS — M5416 Radiculopathy, lumbar region: Secondary | ICD-10-CM | POA: Diagnosis not present

## 2018-05-30 ENCOUNTER — Ambulatory Visit: Payer: BC Managed Care – PPO | Admitting: Adult Health

## 2018-05-30 ENCOUNTER — Encounter: Payer: Self-pay | Admitting: Adult Health

## 2018-05-30 VITALS — BP 138/82 | HR 88 | Resp 16 | Ht 66.0 in | Wt 208.4 lb

## 2018-05-30 DIAGNOSIS — M48061 Spinal stenosis, lumbar region without neurogenic claudication: Secondary | ICD-10-CM | POA: Diagnosis not present

## 2018-05-30 DIAGNOSIS — M545 Low back pain: Secondary | ICD-10-CM

## 2018-05-30 DIAGNOSIS — I10 Essential (primary) hypertension: Secondary | ICD-10-CM | POA: Diagnosis not present

## 2018-05-30 DIAGNOSIS — M79606 Pain in leg, unspecified: Secondary | ICD-10-CM

## 2018-05-30 NOTE — Progress Notes (Signed)
[facility] [place], [place] [address]  Internal MEDICINE  Office Visit Note  Patient Name: Judy Graves  [id number]  [id number]  Date of Service: 06/05/2018  Chief Complaint  Patient presents with  . Letter for School/Work    pt requesting a note for work to be out    HPI Pt is here for follow up on low back pain that radiates down her right leg.  Her recent MRI shows    Lumbar spondylosis, severe canal stenosis.  Redundancy of cauda equina nerve roots, suggestive of impingement.  Also foraminal narrowing in multiple places.  Patient has been out of work and needs another note for her to stand work until she sees the neurosurgeon next month.  She reports that she is still unable to stand for more than 5 or 10 minutes without excruciating pain.  She also reports that her right leg will give out at times and she is afraid she will fall.  She has follow appointments with Ortho as well as neurosurgery in the next few weeks.  Current Medication: Outpatient Encounter Medications as of 05/30/2018  Medication Sig  . aspirin EC 81 MG tablet Take 1 tablet (81 mg total) by mouth daily.  Marland Kitchen atorvastatin (LIPITOR) 40 MG tablet Take 1 tablet (40 mg total) by mouth daily.  . chlorthalidone (HYGROTON) 25 MG tablet Take 1 tablet (25 mg total) by mouth daily.  Marland Kitchen EPIPEN 2-PAK 0.3 MG/0.3ML SOAJ injection AS DIRECTED AS DIRECTED INJECTION 2  . lansoprazole (PREVACID) 30 MG capsule Take 1 capsule (30 mg total) by mouth daily.  Marland Kitchen levothyroxine (SYNTHROID, LEVOTHROID) 50 MCG tablet Take 1 tablet (50 mcg total) by mouth daily. And 2 on Sundays  . meloxicam (MOBIC) 15 MG tablet Take 1 tablet (15 mg total) by mouth daily.  . methocarbamol (ROBAXIN) 500 MG tablet Take 1 tablet (500 mg total) by mouth 4 (four) times daily.  . metoprolol succinate (TOPROL-XL) 50 MG 24 hr tablet Take 1 tablet (50 mg total) by mouth daily.  . predniSONE (DELTASONE) 10 MG tablet Use per dose pack   No  facility-administered encounter medications on file as of 05/30/2018.     Surgical History: Past Surgical History:  Procedure Laterality Date  . CARPAL TUNNEL RELEASE Right 2010   Dr. Sabra Heck  . CATARACT EXTRACTION W/PHACO Left 11/02/2015   Procedure: CATARACT EXTRACTION PHACO AND INTRAOCULAR LENS PLACEMENT ([place]) left eye;  Surgeon: Leandrew Koyanagi, MD;  Location: [place];  Service: Ophthalmology;  Laterality: Left;  . CHONDROPLASTY Left 11/14/2015   Procedure: CHONDROPLASTY;  Surgeon: Dereck Leep, MD;  Location: [facility];  Service: Orthopedics;  Laterality: Left;  . KNEE ARTHROSCOPY WITH LATERAL MENISECTOMY Left 11/14/2015   Procedure: KNEE ARTHROSCOPY WITH LATERAL MENISECTOMY;  Surgeon: Dereck Leep, MD;  Location: [facility];  Service: Orthopedics;  Laterality: Left;  . KNEE ARTHROSCOPY WITH MEDIAL MENISECTOMY Left 11/14/2015   Procedure: KNEE ARTHROSCOPY WITH MEDIAL MENISECTOMY;  Surgeon: Dereck Leep, MD;  Location: [facility];  Service: Orthopedics;  Laterality: Left;  . TONSILLECTOMY    . TUBAL LIGATION      Medical History: Past Medical History:  Diagnosis Date  . Allergy   . Arthritis    in knee  . Chronic constipation   . Chronic insomnia   . GERD (gastroesophageal reflux disease)   . Hemorrhoids   . Hypertension   . Hypothyroidism, adult   . Impingement syndrome of right shoulder    [facility]  subacromial bursitis and tendinitis right shoulder  . Lumbar spinal stenosis    Dr. Phyllis Ginger  . Lump or mass in breast    Dr. Jamal Collin evaluated, likely lipoma  . Menopause   . Metabolic syndrome   . Mild carpal tunnel syndrome of right wrist   . Obesity (BMI 35.0-39.9 without comorbidity)   . Proteinuria     Family History: Family History  Problem Relation Age of Onset  . Cancer Mother        Breast  . Diabetes Mother   . Breast cancer Mother   . Hypertension Father   . CVA Father        55's  . Hypertension Sister   . Hypertension Brother    . Breast cancer Paternal Aunt     Social History   Socioeconomic History  . Marital status: Married    Spouse name: Not on file  . Number of children: Not on file  . Years of education: Not on file  . Highest education level: Not on file  Occupational History  . Not on file  Social Needs  . Financial resource strain: Not on file  . Food insecurity:    Worry: Not on file    Inability: Not on file  . Transportation needs:    Medical: Not on file    Non-medical: Not on file  Tobacco Use  . Smoking status: Never Smoker  . Smokeless tobacco: Never Used  Substance and Sexual Activity  . Alcohol use: No    Alcohol/week: 0.0 standard drinks  . Drug use: No  . Sexual activity: Yes    Partners: Male  Lifestyle  . Physical activity:    Days per week: Not on file    Minutes per session: Not on file  . Stress: Not on file  Relationships  . Social connections:    Talks on phone: Not on file    Gets together: Not on file    Attends religious service: Not on file    Active member of club or organization: Not on file    Attends meetings of clubs or organizations: Not on file    Relationship status: Not on file  . Intimate partner violence:    Fear of current or ex partner: Not on file    Emotionally abused: Not on file    Physically abused: Not on file    Forced sexual activity: Not on file  Other Topics Concern  . Not on file  Social History Narrative  . Not on file      Review of Systems  Constitutional: Negative for chills, fatigue and unexpected weight change.  HENT: Negative for congestion, rhinorrhea, sneezing and sore throat.   Eyes: Negative for photophobia, pain and redness.  Respiratory: Negative for cough, chest tightness and shortness of breath.   Cardiovascular: Negative for chest pain and palpitations.  Gastrointestinal: Negative for abdominal pain, constipation, diarrhea, nausea and vomiting.  Endocrine: Negative.   Genitourinary: Negative for dysuria  and frequency.  Musculoskeletal: Negative for arthralgias, back pain, joint swelling and neck pain.  Skin: Negative for rash.  Allergic/Immunologic: Negative.   Neurological: Negative for tremors and numbness.  Hematological: Negative for adenopathy. Does not bruise/bleed easily.  Psychiatric/Behavioral: Negative for behavioral problems and sleep disturbance. The patient is not nervous/anxious.     Vital Signs: BP 138/82 (BP Location: Left Arm, Patient Position: Sitting, Cuff Size: Large)   Pulse 88   Resp 16   Ht 5\' 6"  (1.676 m)  Wt 208 lb 6.4 oz (94.5 kg)   SpO2 96%   BMI 33.64 kg/m    Physical Exam Vitals signs and nursing note reviewed.  Constitutional:      General: She is not in acute distress.    Appearance: She is well-developed. She is not diaphoretic.  HENT:     Head: Normocephalic and atraumatic.     Mouth/Throat:     Pharynx: No oropharyngeal exudate.  Eyes:     Pupils: Pupils are equal, round, and reactive to light.  Neck:     Musculoskeletal: Normal range of motion and neck supple.     Thyroid: No thyromegaly.     Vascular: No JVD.     Trachea: No tracheal deviation.  Cardiovascular:     Rate and Rhythm: Normal rate and regular rhythm.     Heart sounds: Normal heart sounds. No murmur. No friction rub. No gallop.   Pulmonary:     Effort: Pulmonary effort is normal. No respiratory distress.     Breath sounds: Normal breath sounds. No wheezing or rales.  Chest:     Chest wall: No tenderness.  Abdominal:     Palpations: Abdomen is soft.     Tenderness: There is no abdominal tenderness. There is no guarding.  Musculoskeletal: Normal range of motion.  Lymphadenopathy:     Cervical: No cervical adenopathy.  Skin:    General: Skin is warm and dry.  Neurological:     Mental Status: She is alert and oriented to person, place, and time.     Cranial Nerves: No cranial nerve deficit.  Psychiatric:        Behavior: Behavior normal.        Thought Content:  Thought content normal.        Judgment: Judgment normal.    Assessment/Plan: 1. Spinal stenosis at L4-L5 level Due to patient's ongoing lower back pain she will continue to stay out of work this time until March 2.  She will follow-up with neurosurgery and Ortho in the coming weeks.  She should continue use all her medications as prescribed.  2. Low back pain radiating down leg Her low back pain that radiates down her leg is likely due to the spinal stenosis found on MRI.  Encourage patient to rest as much as possible and take her medications.  Do not walk or stand for more than 5 to 10 minutes.  3. Benign hypertension Stable continue current medications  General Counseling: Emiline verbalizes understanding of the findings of todays visit and agrees with plan of treatment. I have discussed any further diagnostic evaluation that may be needed or ordered today. We also reviewed her medications today. she has been encouraged to call the office with any questions or concerns that should arise related to todays visit.    No orders of the defined types were placed in this encounter.   No orders of the defined types were placed in this encounter.   Time spent: 25 Minutes   This patient was seen by Orson Gear AGNP-C in Collaboration with Dr Lavera Guise as a part of collaborative care agreement     Kendell Bane AGNP-C Internal medicine

## 2018-06-05 DIAGNOSIS — M48061 Spinal stenosis, lumbar region without neurogenic claudication: Secondary | ICD-10-CM | POA: Diagnosis not present

## 2018-06-27 ENCOUNTER — Encounter: Payer: Self-pay | Admitting: Adult Health

## 2018-07-08 ENCOUNTER — Ambulatory Visit: Payer: BC Managed Care – PPO | Admitting: Adult Health

## 2018-07-08 ENCOUNTER — Encounter: Payer: Self-pay | Admitting: Adult Health

## 2018-07-08 VITALS — BP 126/88 | HR 71 | Resp 16 | Ht 66.0 in | Wt 207.0 lb

## 2018-07-08 DIAGNOSIS — M545 Low back pain, unspecified: Secondary | ICD-10-CM

## 2018-07-08 DIAGNOSIS — I10 Essential (primary) hypertension: Secondary | ICD-10-CM

## 2018-07-08 DIAGNOSIS — K219 Gastro-esophageal reflux disease without esophagitis: Secondary | ICD-10-CM | POA: Diagnosis not present

## 2018-07-08 NOTE — Progress Notes (Signed)
[facility] [place], [place] [address]  Internal MEDICINE  Office Visit Note  Patient Name: Judy Graves  [id number]  [id number]  Date of Service: 07/08/2018  Chief Complaint  Patient presents with  . Back Pain    having surgery on the 23 of this month   . Hypertension  . Gastroesophageal Reflux    HPI Pt is here for follow up on HTN, GERD and back pain.  She continues to report back pain that waxes and wanes depending on the day.  She is scheduled to have surgery for his back on the 23rd of this month. Her blood pressure is well controlled at this time 126/88.  She denies any cp, sob, palpitations or headaches.  She denies any GERD symptoms.  She has been taking all of her medications and denies any further need at this time.      Current Medication: Outpatient Encounter Medications as of 07/08/2018  Medication Sig  . aspirin EC 81 MG tablet Take 1 tablet (81 mg total) by mouth daily.  Marland Kitchen atorvastatin (LIPITOR) 40 MG tablet Take 1 tablet (40 mg total) by mouth daily.  . chlorthalidone (HYGROTON) 25 MG tablet Take 1 tablet (25 mg total) by mouth daily.  Marland Kitchen EPIPEN 2-PAK 0.3 MG/0.3ML SOAJ injection AS DIRECTED AS DIRECTED INJECTION 2  . lansoprazole (PREVACID) 30 MG capsule Take 1 capsule (30 mg total) by mouth daily.  Marland Kitchen levothyroxine (SYNTHROID, LEVOTHROID) 50 MCG tablet Take 1 tablet (50 mcg total) by mouth daily. And 2 on Sundays  . metoprolol succinate (TOPROL-XL) 50 MG 24 hr tablet Take 1 tablet (50 mg total) by mouth daily.  . [DISCONTINUED] meloxicam (MOBIC) 15 MG tablet Take 1 tablet (15 mg total) by mouth daily. (Patient not taking: Reported on 07/08/2018)  . [DISCONTINUED] methocarbamol (ROBAXIN) 500 MG tablet Take 1 tablet (500 mg total) by mouth 4 (four) times daily. (Patient not taking: Reported on 07/08/2018)  . [DISCONTINUED] predniSONE (DELTASONE) 10 MG tablet Use per dose pack (Patient not taking: Reported on 07/08/2018)   No facility-administered  encounter medications on file as of 07/08/2018.     Surgical History: Past Surgical History:  Procedure Laterality Date  . CARPAL TUNNEL RELEASE Right 2010   Dr. Sabra Heck  . CATARACT EXTRACTION W/PHACO Left 11/02/2015   Procedure: CATARACT EXTRACTION PHACO AND INTRAOCULAR LENS PLACEMENT ([place]) left eye;  Surgeon: Leandrew Koyanagi, MD;  Location: [facility];  Service: Ophthalmology;  Laterality: Left;  . CHONDROPLASTY Left 11/14/2015   Procedure: CHONDROPLASTY;  Surgeon: Dereck Leep, MD;  Location: [facility];  Service: Orthopedics;  Laterality: Left;  . KNEE ARTHROSCOPY WITH LATERAL MENISECTOMY Left 11/14/2015   Procedure: KNEE ARTHROSCOPY WITH LATERAL MENISECTOMY;  Surgeon: Dereck Leep, MD;  Location: [facility];  Service: Orthopedics;  Laterality: Left;  . KNEE ARTHROSCOPY WITH MEDIAL MENISECTOMY Left 11/14/2015   Procedure: KNEE ARTHROSCOPY WITH MEDIAL MENISECTOMY;  Surgeon: Dereck Leep, MD;  Location: [facility];  Service: Orthopedics;  Laterality: Left;  . TONSILLECTOMY    . TUBAL LIGATION      Medical History: Past Medical History:  Diagnosis Date  . Allergy   . Arthritis    in knee  . Chronic constipation   . Chronic insomnia   . GERD (gastroesophageal reflux disease)   . Hemorrhoids   . Hypertension   . Hypothyroidism, adult   . Impingement syndrome of right shoulder    [facility] subacromial bursitis and tendinitis right shoulder  . Lumbar  spinal stenosis    Dr. Phyllis Ginger  . Lump or mass in breast    Dr. Jamal Collin evaluated, likely lipoma  . Menopause   . Metabolic syndrome   . Mild carpal tunnel syndrome of right wrist   . Obesity (BMI 35.0-39.9 without comorbidity)   . Proteinuria     Family History: Family History  Problem Relation Age of Onset  . Cancer Mother        Breast  . Diabetes Mother   . Breast cancer Mother   . Hypertension Father   . CVA Father        34's  . Hypertension Sister   . Hypertension Brother   . Breast cancer  Paternal Aunt     Social History   Socioeconomic History  . Marital status: Married    Spouse name: Not on file  . Number of children: Not on file  . Years of education: Not on file  . Highest education level: Not on file  Occupational History  . Not on file  Social Needs  . Financial resource strain: Not on file  . Food insecurity:    Worry: Not on file    Inability: Not on file  . Transportation needs:    Medical: Not on file    Non-medical: Not on file  Tobacco Use  . Smoking status: Never Smoker  . Smokeless tobacco: Never Used  Substance and Sexual Activity  . Alcohol use: No    Alcohol/week: 0.0 standard drinks  . Drug use: No  . Sexual activity: Yes    Partners: Male  Lifestyle  . Physical activity:    Days per week: Not on file    Minutes per session: Not on file  . Stress: Not on file  Relationships  . Social connections:    Talks on phone: Not on file    Gets together: Not on file    Attends religious service: Not on file    Active member of club or organization: Not on file    Attends meetings of clubs or organizations: Not on file    Relationship status: Not on file  . Intimate partner violence:    Fear of current or ex partner: Not on file    Emotionally abused: Not on file    Physically abused: Not on file    Forced sexual activity: Not on file  Other Topics Concern  . Not on file  Social History Narrative  . Not on file      Review of Systems  Constitutional: Negative for chills, fatigue and unexpected weight change.  HENT: Negative for congestion, rhinorrhea, sneezing and sore throat.   Eyes: Negative for photophobia, pain and redness.  Respiratory: Negative for cough, chest tightness and shortness of breath.   Cardiovascular: Negative for chest pain and palpitations.  Gastrointestinal: Negative for abdominal pain, constipation, diarrhea, nausea and vomiting.  Endocrine: Negative.   Genitourinary: Negative for dysuria and frequency.   Musculoskeletal: Negative for arthralgias, back pain, joint swelling and neck pain.  Skin: Negative for rash.  Allergic/Immunologic: Negative.   Neurological: Negative for tremors and numbness.  Hematological: Negative for adenopathy. Does not bruise/bleed easily.  Psychiatric/Behavioral: Negative for behavioral problems and sleep disturbance. The patient is not nervous/anxious.     Vital Signs: BP 126/88   Pulse 71   Resp 16   Ht 5\' 6"  (1.676 m)   Wt 207 lb (93.9 kg)   SpO2 95%   BMI 33.41 kg/m    Physical  Exam Vitals signs and nursing note reviewed.  Constitutional:      General: She is not in acute distress.    Appearance: She is well-developed. She is not diaphoretic.  HENT:     Head: Normocephalic and atraumatic.     Mouth/Throat:     Pharynx: No oropharyngeal exudate.  Eyes:     Pupils: Pupils are equal, round, and reactive to light.  Neck:     Musculoskeletal: Normal range of motion and neck supple.     Thyroid: No thyromegaly.     Vascular: No JVD.     Trachea: No tracheal deviation.  Cardiovascular:     Rate and Rhythm: Normal rate and regular rhythm.     Heart sounds: Normal heart sounds. No murmur. No friction rub. No gallop.   Pulmonary:     Effort: Pulmonary effort is normal. No respiratory distress.     Breath sounds: Normal breath sounds. No wheezing or rales.  Chest:     Chest wall: No tenderness.  Abdominal:     Palpations: Abdomen is soft.     Tenderness: There is no abdominal tenderness. There is no guarding.  Musculoskeletal: Normal range of motion.  Lymphadenopathy:     Cervical: No cervical adenopathy.  Skin:    General: Skin is warm and dry.  Neurological:     Mental Status: She is alert and oriented to person, place, and time.     Cranial Nerves: No cranial nerve deficit.  Psychiatric:        Behavior: Behavior normal.        Thought Content: Thought content normal.        Judgment: Judgment normal.    Assessment/Plan: 1. Low back  pain radiating down leg Continue present management.  Keep appt to have surgery later this month, and follow up with [place] as needed.   2. Benign hypertension Stable, continue present management.   3. Gastro-esophageal reflux disease without esophagitis Stable, denies any recent issues. Continue present management.   General Counseling: aquarius tremper understanding of the findings of todays visit and agrees with plan of treatment. I have discussed any further diagnostic evaluation that may be needed or ordered today. We also reviewed her medications today. she has been encouraged to call the office with any questions or concerns that should arise related to todays visit.    No orders of the defined types were placed in this encounter.   No orders of the defined types were placed in this encounter.   Time spent: 25 Minutes   This patient was seen by Orson Gear AGNP-C in Collaboration with Dr Lavera Guise as a part of collaborative care agreement     Kendell Bane AGNP-C Internal medicine

## 2018-07-08 NOTE — Patient Instructions (Signed)

## 2018-07-20 ENCOUNTER — Other Ambulatory Visit: Payer: Self-pay | Admitting: Internal Medicine

## 2018-07-20 DIAGNOSIS — E038 Other specified hypothyroidism: Secondary | ICD-10-CM

## 2018-08-27 ENCOUNTER — Encounter: Payer: Self-pay | Admitting: Adult Health

## 2018-09-03 DIAGNOSIS — B301 Conjunctivitis due to adenovirus: Secondary | ICD-10-CM | POA: Diagnosis not present

## 2018-10-06 ENCOUNTER — Other Ambulatory Visit: Payer: Self-pay

## 2018-10-06 MED ORDER — LANSOPRAZOLE 30 MG PO CPDR: 30.0000 mg | DELAYED_RELEASE_CAPSULE | Freq: Every day | ORAL | 1 refills | Status: DC

## 2018-10-09 ENCOUNTER — Ambulatory Visit: Payer: BC Managed Care – PPO | Admitting: Adult Health

## 2018-10-09 ENCOUNTER — Other Ambulatory Visit: Payer: Self-pay

## 2018-10-14 ENCOUNTER — Other Ambulatory Visit: Payer: Self-pay | Admitting: Internal Medicine

## 2018-10-18 ENCOUNTER — Other Ambulatory Visit: Payer: Self-pay | Admitting: Adult Health

## 2018-10-18 DIAGNOSIS — E785 Hyperlipidemia, unspecified: Secondary | ICD-10-CM

## 2018-10-28 ENCOUNTER — Other Ambulatory Visit: Payer: Self-pay

## 2018-10-28 NOTE — Telephone Encounter (Signed)
Pt called inquiring about changing her pharmacy for her prescriptions. Wanted it changed to [facility] in [place]

## 2018-10-29 ENCOUNTER — Other Ambulatory Visit: Payer: Self-pay

## 2018-10-29 DIAGNOSIS — I10 Essential (primary) hypertension: Secondary | ICD-10-CM

## 2018-10-29 DIAGNOSIS — E785 Hyperlipidemia, unspecified: Secondary | ICD-10-CM

## 2018-10-29 DIAGNOSIS — E038 Other specified hypothyroidism: Secondary | ICD-10-CM

## 2018-10-29 MED ORDER — ATORVASTATIN CALCIUM 40 MG PO TABS: 40.0000 mg | ORAL_TABLET | Freq: Every day | ORAL | 1 refills | Status: DC

## 2018-10-29 MED ORDER — METOPROLOL SUCCINATE ER 50 MG PO TB24: 50.0000 mg | ORAL_TABLET | Freq: Every day | ORAL | 1 refills | Status: DC

## 2018-10-29 MED ORDER — LANSOPRAZOLE 30 MG PO CPDR: 30.0000 mg | DELAYED_RELEASE_CAPSULE | Freq: Every day | ORAL | 1 refills | Status: DC

## 2018-10-29 MED ORDER — CHLORTHALIDONE 25 MG PO TABS: 25.0000 mg | ORAL_TABLET | Freq: Every day | ORAL | 1 refills | Status: DC

## 2018-10-29 MED ORDER — LEVOTHYROXINE SODIUM 50 MCG PO TABS: ORAL_TABLET | ORAL | 1 refills | Status: DC

## 2018-11-05 ENCOUNTER — Other Ambulatory Visit: Payer: Self-pay

## 2018-11-05 ENCOUNTER — Other Ambulatory Visit: Payer: Self-pay | Admitting: Adult Health

## 2018-11-05 DIAGNOSIS — I10 Essential (primary) hypertension: Secondary | ICD-10-CM

## 2019-03-03 ENCOUNTER — Ambulatory Visit: Payer: BC Managed Care – PPO | Admitting: Adult Health

## 2019-03-04 ENCOUNTER — Other Ambulatory Visit: Payer: Self-pay | Admitting: Internal Medicine

## 2019-03-04 ENCOUNTER — Encounter: Payer: Self-pay | Admitting: Adult Health

## 2019-03-04 ENCOUNTER — Ambulatory Visit: Payer: BLUE CROSS/BLUE SHIELD | Admitting: Internal Medicine

## 2019-03-04 ENCOUNTER — Other Ambulatory Visit: Payer: Self-pay

## 2019-03-04 DIAGNOSIS — Z1231 Encounter for screening mammogram for malignant neoplasm of breast: Secondary | ICD-10-CM | POA: Diagnosis not present

## 2019-03-04 DIAGNOSIS — Z1211 Encounter for screening for malignant neoplasm of colon: Secondary | ICD-10-CM

## 2019-03-04 DIAGNOSIS — Z0001 Encounter for general adult medical examination with abnormal findings: Secondary | ICD-10-CM

## 2019-03-04 DIAGNOSIS — E119 Type 2 diabetes mellitus without complications: Secondary | ICD-10-CM

## 2019-03-04 DIAGNOSIS — I10 Essential (primary) hypertension: Secondary | ICD-10-CM

## 2019-03-04 DIAGNOSIS — E785 Hyperlipidemia, unspecified: Secondary | ICD-10-CM | POA: Diagnosis not present

## 2019-03-04 DIAGNOSIS — E039 Hypothyroidism, unspecified: Secondary | ICD-10-CM

## 2019-03-04 NOTE — Progress Notes (Signed)
[facility] [place], [place] [address]  Internal MEDICINE  Office Visit Note  Patient Name: Judy Graves  [id number]  [id number]  Date of Service: 03/11/2019  Chief Complaint  Patient presents with  . Hypertension  . Gastroesophageal Reflux  . Medication Refill    on all medications     HPI     Current Medication: Outpatient Encounter Medications as of 03/04/2019  Medication Sig  . ASPIRIN LOW DOSE 81 MG EC tablet TAKE 1 TABLET DAILY  . atorvastatin (LIPITOR) 40 MG tablet Take 1 tablet (40 mg total) by mouth daily.  . chlorthalidone (HYGROTON) 25 MG tablet Take 1 tablet (25 mg total) by mouth daily.  Marland Kitchen EPIPEN 2-PAK 0.3 MG/0.3ML SOAJ injection AS DIRECTED AS DIRECTED INJECTION 2  . lansoprazole (PREVACID) 30 MG capsule Take 1 capsule (30 mg total) by mouth daily.  Marland Kitchen levothyroxine (SYNTHROID) 50 MCG tablet TAKE 1 TABLET DAILY AND 2 TABLETS ON SUNDAYS ( KEEP UPCOMING APPOINTMENT WITH DR. SOWLES FOR REFILLS )  . metoprolol succinate (TOPROL-XL) 50 MG 24 hr tablet Take 1 tablet (50 mg total) by mouth daily.   No facility-administered encounter medications on file as of 03/04/2019.     Surgical History: Past Surgical History:  Procedure Laterality Date  . CARPAL TUNNEL RELEASE Right 2010   Dr. Miller  . CATARACT EXTRACTION W/PHACO Left 11/02/2015   Procedure: CATARACT EXTRACTION PHACO AND INTRAOCULAR LENS PLACEMENT ([facility]) left eye;  Surgeon: Chadwick Brasington, MD;  Location: [facility];  Service: Ophthalmology;  Laterality: Left;  . CHONDROPLASTY Left 11/14/2015   Procedure: CHONDROPLASTY;  Surgeon: James P Hooten, MD;  Location: [facility];  Service: Orthopedics;  Laterality: Left;  . KNEE ARTHROSCOPY WITH LATERAL MENISECTOMY Left 11/14/2015   Procedure: KNEE ARTHROSCOPY WITH LATERAL MENISECTOMY;  Surgeon: James P Hooten, MD;  Location: [facility];  Service: Orthopedics;  Laterality: Left;  . KNEE ARTHROSCOPY WITH MEDIAL MENISECTOMY Left  11/14/2015   Procedure: KNEE ARTHROSCOPY WITH MEDIAL MENISECTOMY;  Surgeon: James P Hooten, MD;  Location: [facility];  Service: Orthopedics;  Laterality: Left;  . TONSILLECTOMY    . TUBAL LIGATION      Medical History: Past Medical History:  Diagnosis Date  . Allergy   . Arthritis    in knee  . Chronic constipation   . Chronic insomnia   . GERD (gastroesophageal reflux disease)   . Hemorrhoids   . Hypertension   . Hypothyroidism, adult   . Impingement syndrome of right shoulder    [facility] subacromial bursitis and tendinitis right shoulder  . Lumbar spinal stenosis    Dr. Chasniss  . Lump or mass in breast    Dr. Sankar evaluated, likely lipoma  . Menopause   . Metabolic syndrome   . Mild carpal tunnel syndrome of right wrist   . Obesity (BMI 35.0-39.9 without comorbidity)   . Proteinuria     Family History: Family History  Problem Relation Age of Onset  . Cancer Mother        Breast  . Diabetes Mother   . Breast cancer Mother   . Hypertension Father   . CVA Father        80 's  . Hypertension Sister   . Hypertension Brother   . Breast cancer Paternal Aunt     Social History   Socioeconomic History  . Marital status: Married    Spouse name: Not on file  . Number of children: Not on file  . Years  of education: Not on file  . Highest education level: Not on file  Occupational History  . Not on file  Social Needs  . Financial resource strain: Not on file  . Food insecurity    Worry: Not on file    Inability: Not on file  . Transportation needs    Medical: Not on file    Non-medical: Not on file  Tobacco Use  . Smoking status: Never Smoker  . Smokeless tobacco: Never Used  Substance and Sexual Activity  . Alcohol use: No    Alcohol/week: 0.0 standard drinks  . Drug use: No  . Sexual activity: Yes    Partners: Male  Lifestyle  . Physical activity    Days per week: Not on file    Minutes per session: Not on file  . Stress: Not on file   Relationships  . Social Herbalist on phone: Not on file    Gets together: Not on file    Attends religious service: Not on file    Active member of club or organization: Not on file    Attends meetings of clubs or organizations: Not on file    Relationship status: Not on file  . Intimate partner violence    Fear of current or ex partner: Not on file    Emotionally abused: Not on file    Physically abused: Not on file    Forced sexual activity: Not on file  Other Topics Concern  . Not on file  Social History Narrative  . Not on file   Review of Systems  Constitutional: Negative for chills, diaphoresis and fatigue.  HENT: Negative for ear pain, postnasal drip and sinus pressure.   Eyes: Negative for photophobia, discharge, redness, itching and visual disturbance.  Respiratory: Negative for cough, shortness of breath and wheezing.   Cardiovascular: Negative for chest pain, palpitations and leg swelling.  Gastrointestinal: Negative for abdominal pain, constipation, diarrhea, nausea and vomiting.  Genitourinary: Negative for dysuria and flank pain.  Musculoskeletal: Negative for arthralgias, back pain, gait problem and neck pain.  Skin: Negative for color change.  Allergic/Immunologic: Negative for environmental allergies and food allergies.  Neurological: Negative for dizziness and headaches.  Hematological: Does not bruise/bleed easily.  Psychiatric/Behavioral: Negative for agitation, behavioral problems (depression) and hallucinations.    Vital Signs: BP (!) 156/90   Pulse 80   Resp 16   Ht 5\' 6"  (1.676 m)   Wt 205 lb (93 kg)   SpO2 95%   BMI 33.09 kg/m    Physical Exam Constitutional:      General: She is not in acute distress.    Appearance: She is well-developed. She is not diaphoretic.  HENT:     Head: Normocephalic and atraumatic.     Mouth/Throat:     Pharynx: No oropharyngeal exudate.  Eyes:     Pupils: Pupils are equal, round, and reactive to  light.  Neck:     Musculoskeletal: Normal range of motion and neck supple.     Thyroid: No thyromegaly.     Vascular: No JVD.     Trachea: No tracheal deviation.  Cardiovascular:     Rate and Rhythm: Normal rate and regular rhythm.     Heart sounds: Normal heart sounds. No murmur. No friction rub. No gallop.   Pulmonary:     Effort: Pulmonary effort is normal. No respiratory distress.     Breath sounds: No wheezing or rales.  Chest:  Chest wall: No tenderness.  Abdominal:     General: Bowel sounds are normal.     Palpations: Abdomen is soft.  Musculoskeletal: Normal range of motion.  Lymphadenopathy:     Cervical: No cervical adenopathy.  Skin:    General: Skin is warm and dry.  Neurological:     Mental Status: She is alert and oriented to person, place, and time.     Cranial Nerves: No cranial nerve deficit.  Psychiatric:        Behavior: Behavior normal.        Thought Content: Thought content normal.        Judgment: Judgment normal.   Assessment/Plan: 1. Diabetes mellitus without complication ([place]) - Elevate glucose, will add hg a1c, treat if indicated   2. Essential hypertension - Uncontrolled, will check Labs first, will need to modify therapy, add norvasc if needed   3. Visit for screening mammogram - Mammogram is ordered.   4. Hyperlipidemia, unspecified hyperlipidemia type - Continue Lipitor as before   5. Hypothyroidism, unspecified type - Continue Synthroid as before  - TSH - T4, free  6. Colon cancer screening - Pt does not want to have a colonoscopy, will order Cologaurd  General Counseling: Alexander Mt understanding of the findings of todays visit and agrees with plan of treatment. I have discussed any further diagnostic evaluation that may be needed or ordered today. We also reviewed her medications today. she has been encouraged to call the office with any questions or concerns that should arise related to todays visit.  Orders Placed This  Encounter  Procedures  . CBC with Differential/Platelet  . Lipid Panel With LDL/HDL Ratio  . TSH  . T4, free  . Comprehensive metabolic panel  . Urinalysis  . Hgb A1c w/o eAG  . Specimen status report    Time spent: 25 Minutes  Dr Lavera Guise Internal medicine

## 2019-03-05 LAB — LIPID PANEL WITH LDL/HDL RATIO
Cholesterol, Total: 155 mg/dL (ref 100–199)
HDL: 46 mg/dL (ref >39)
LDL Chol Calc (NIH): 92 mg/dL (ref 0–99)
LDL/HDL Ratio: 2 ratio (ref 0.0–3.2)
Triglycerides: 91 mg/dL (ref 0–149)
VLDL Cholesterol Cal: 17 mg/dL (ref 5–40)

## 2019-03-05 LAB — CBC WITH DIFFERENTIAL/PLATELET
Basophils Absolute: 0 10*3/uL (ref 0.0–0.2)
Basos: 1 %
EOS (ABSOLUTE): 0 10*3/uL (ref 0.0–0.4)
Eos: 1 %
Hematocrit: 42.1 % (ref 34.0–46.6)
Hemoglobin: 15 g/dL (ref 11.1–15.9)
Immature Grans (Abs): 0 10*3/uL (ref 0.0–0.1)
Immature Granulocytes: 0 %
Lymphocytes Absolute: 3.4 10*3/uL — ABNORMAL HIGH (ref 0.7–3.1)
Lymphs: 41 %
MCH: 31.6 pg (ref 26.6–33.0)
MCHC: 35.6 g/dL (ref 31.5–35.7)
MCV: 89 fL (ref 79–97)
Monocytes Absolute: 0.7 10*3/uL (ref 0.1–0.9)
Monocytes: 8 %
Neutrophils Absolute: 4.2 10*3/uL (ref 1.4–7.0)
Neutrophils: 49 %
Platelets: 206 10*3/uL (ref 150–450)
RBC: 4.74 x10E6/uL (ref 3.77–5.28)
RDW: 12.3 % (ref 11.7–15.4)
WBC: 8.4 10*3/uL (ref 3.4–10.8)

## 2019-03-05 LAB — COMPREHENSIVE METABOLIC PANEL
ALT: 38 IU/L — ABNORMAL HIGH (ref 0–32)
AST: 24 IU/L (ref 0–40)
Albumin/Globulin Ratio: 1.4 (ref 1.2–2.2)
Albumin: 4.4 g/dL (ref 3.8–4.8)
Alkaline Phosphatase: 92 IU/L (ref 39–117)
BUN/Creatinine Ratio: 19 (ref 12–28)
BUN: 20 mg/dL (ref 8–27)
Bilirubin Total: 0.4 mg/dL (ref 0.0–1.2)
CO2: 28 mmol/L (ref 20–29)
Calcium: 9.9 mg/dL (ref 8.7–10.3)
Chloride: 98 mmol/L (ref 96–106)
Creatinine, Ser: 1.04 mg/dL — ABNORMAL HIGH (ref 0.57–1.00)
GFR calc Af Amer: 67 mL/min/{1.73_m2} (ref >59)
GFR calc non Af Amer: 58 mL/min/{1.73_m2} — ABNORMAL LOW (ref >59)
Globulin, Total: 3.1 g/dL (ref 1.5–4.5)
Glucose: 154 mg/dL — ABNORMAL HIGH (ref 65–99)
Potassium: 3.9 mmol/L (ref 3.5–5.2)
Sodium: 140 mmol/L (ref 134–144)
Total Protein: 7.5 g/dL (ref 6.0–8.5)

## 2019-03-05 LAB — T4, FREE: Free T4: 1.54 ng/dL (ref 0.82–1.77)

## 2019-03-05 LAB — TSH: TSH: 2.03 u[IU]/mL (ref 0.450–4.500)

## 2019-03-06 ENCOUNTER — Other Ambulatory Visit: Payer: Self-pay

## 2019-03-06 ENCOUNTER — Ambulatory Visit (INDEPENDENT_AMBULATORY_CARE_PROVIDER_SITE_OTHER): Payer: BLUE CROSS/BLUE SHIELD

## 2019-03-06 DIAGNOSIS — Z23 Encounter for immunization: Secondary | ICD-10-CM

## 2019-03-10 LAB — SPECIMEN STATUS REPORT

## 2019-03-10 LAB — HGB A1C W/O EAG: Hgb A1c MFr Bld: 8.5 % — ABNORMAL HIGH (ref 4.8–5.6)

## 2019-03-12 ENCOUNTER — Telehealth: Payer: Self-pay

## 2019-03-12 NOTE — Telephone Encounter (Signed)
-----   Message from Lavera Guise, MD sent at 03/11/2019  8:20 AM EST ----- Pt blood sugar is high on recent labs, she needs to watch her diet for sugar, avoid desserts, will need to give her glucometer  Make her a follow up app sooner if she wants to come in to discuss, otherwise will discuss on her physical next month.

## 2019-03-12 NOTE — Telephone Encounter (Signed)
Advised pt blood sugar is high on recent labs and to avoid sugary foods. Also notified pt that we have a glucometer at front desk for her to pick up. Pt also decided to wait until next appt to discuss further.

## 2019-04-01 ENCOUNTER — Telehealth: Payer: Self-pay

## 2019-04-01 NOTE — Telephone Encounter (Signed)
CONFIRMED AND SCREENED FOR 04-07-19 OV. °

## 2019-04-06 ENCOUNTER — Telehealth: Payer: Self-pay

## 2019-04-06 NOTE — Telephone Encounter (Signed)
CONFIRMED AS A VIRTUAL VISIT 04-07-19.

## 2019-04-07 ENCOUNTER — Ambulatory Visit (INDEPENDENT_AMBULATORY_CARE_PROVIDER_SITE_OTHER): Payer: BLUE CROSS/BLUE SHIELD | Admitting: Nurse Practitioner

## 2019-04-07 ENCOUNTER — Other Ambulatory Visit: Payer: Self-pay

## 2019-04-07 ENCOUNTER — Encounter: Payer: Self-pay | Admitting: Nurse Practitioner

## 2019-04-07 VITALS — BP 152/78 | HR 76 | Resp 16 | Ht 66.0 in | Wt 200.0 lb

## 2019-04-07 DIAGNOSIS — E119 Type 2 diabetes mellitus without complications: Secondary | ICD-10-CM | POA: Diagnosis not present

## 2019-04-07 DIAGNOSIS — I10 Essential (primary) hypertension: Secondary | ICD-10-CM | POA: Diagnosis not present

## 2019-04-07 DIAGNOSIS — K581 Irritable bowel syndrome with constipation: Secondary | ICD-10-CM | POA: Diagnosis not present

## 2019-04-07 MED ORDER — FARXIGA 5 MG PO TABS: 5.0000 mg | ORAL_TABLET | Freq: Every day | ORAL | 2 refills | Status: DC

## 2019-04-07 MED ORDER — LUBIPROSTONE 8 MCG PO CAPS: 8.0000 ug | ORAL_CAPSULE | Freq: Two times a day (BID) | ORAL | 2 refills | Status: DC

## 2019-04-07 NOTE — Progress Notes (Signed)
[facility] [place], [place] [address]  Internal MEDICINE  Telephone Visit  Patient Name: Judy Graves  [id number]  [id number]  Date of Service: 04/07/2019  I connected with the patient at 11:44am by telephone and verified the patients identity using two identifiers.   I discussed the limitations, risks, security and privacy concerns of performing an evaluation and management service by telephone and the availability of in person appointments. I also discussed with the patient that there may be a patient responsible charge related to the service.  The patient expressed understanding and agrees to proceed.    Chief Complaint  Patient presents with  . Telephone Assessment  . Telephone Screen  . Hypertension    The patient has been contacted via telephone for follow up visit due to concerns for spread of novel coronavirus. She recently had lab work done. Her HgbA1c was 8.5 with blood sugar of 154. She states that she has had problems with her blood sugars off and on. She has been on medication in the past to help lower her blood sugar. Cannot remember the name. States that it made her feel bad. Has not tried anything since then.  She is also having trouble with constipation. Was given samples of linzess. States this medicine worked a little too well. Caused her to have diarrhea. States that her insurance will not pay for it.        Current Medication: Outpatient Encounter Medications as of 04/07/2019  Medication Sig  . ASPIRIN LOW DOSE 81 MG EC tablet TAKE 1 TABLET DAILY  . atorvastatin (LIPITOR) 40 MG tablet Take 1 tablet (40 mg total) by mouth daily.  . chlorthalidone (HYGROTON) 25 MG tablet Take 1 tablet (25 mg total) by mouth daily.  Marland Kitchen EPIPEN 2-PAK 0.3 MG/0.3ML SOAJ injection AS DIRECTED AS DIRECTED INJECTION 2  . lansoprazole (PREVACID) 30 MG capsule Take 1 capsule (30 mg total) by mouth daily.  Marland Kitchen levothyroxine (SYNTHROID) 50 MCG tablet TAKE 1 TABLET  DAILY AND 2 TABLETS ON SUNDAYS ( KEEP UPCOMING APPOINTMENT WITH DR. Ancil Boozer FOR REFILLS )  . metoprolol succinate (TOPROL-XL) 50 MG 24 hr tablet Take 1 tablet (50 mg total) by mouth daily.  . dapagliflozin propanediol (FARXIGA) 5 MG TABS tablet Take 5 mg by mouth daily before breakfast.  . lubiprostone (AMITIZA) 8 MCG capsule Take 1 capsule (8 mcg total) by mouth 2 (two) times daily with a meal.   No facility-administered encounter medications on file as of 04/07/2019.     Surgical History: Past Surgical History:  Procedure Laterality Date  . CARPAL TUNNEL RELEASE Right 2010   Dr. Sabra Heck  . CATARACT EXTRACTION W/PHACO Left 11/02/2015   Procedure: CATARACT EXTRACTION PHACO AND INTRAOCULAR LENS PLACEMENT ([place]) left eye;  Surgeon: Leandrew Koyanagi, MD;  Location: [place];  Service: Ophthalmology;  Laterality: Left;  . CHONDROPLASTY Left 11/14/2015   Procedure: CHONDROPLASTY;  Surgeon: Dereck Leep, MD;  Location: [facility];  Service: Orthopedics;  Laterality: Left;  . KNEE ARTHROSCOPY WITH LATERAL MENISECTOMY Left 11/14/2015   Procedure: KNEE ARTHROSCOPY WITH LATERAL MENISECTOMY;  Surgeon: Dereck Leep, MD;  Location: [facility];  Service: Orthopedics;  Laterality: Left;  . KNEE ARTHROSCOPY WITH MEDIAL MENISECTOMY Left 11/14/2015   Procedure: KNEE ARTHROSCOPY WITH MEDIAL MENISECTOMY;  Surgeon: Dereck Leep, MD;  Location: [facility];  Service: Orthopedics;  Laterality: Left;  . TONSILLECTOMY    . TUBAL LIGATION      Medical History: Past Medical History:  Diagnosis Date  . Allergy   . Arthritis    in knee  . Chronic constipation   . Chronic insomnia   . GERD (gastroesophageal reflux disease)   . Hemorrhoids   . Hypertension   . Hypothyroidism, adult   . Impingement syndrome of right shoulder    [facility] subacromial bursitis and tendinitis right shoulder  . Lumbar spinal stenosis    Dr. Phyllis Ginger  . Lump or mass in breast    Dr. Jamal Collin evaluated, likely lipoma   . Menopause   . Metabolic syndrome   . Mild carpal tunnel syndrome of right wrist   . Obesity (BMI 35.0-39.9 without comorbidity)   . Proteinuria     Family History: Family History  Problem Relation Age of Onset  . Cancer Mother        Breast  . Diabetes Mother   . Breast cancer Mother   . Hypertension Father   . CVA Father        61's  . Hypertension Sister   . Hypertension Brother   . Breast cancer Paternal Aunt     Social History   Socioeconomic History  . Marital status: Married    Spouse name: Not on file  . Number of children: Not on file  . Years of education: Not on file  . Highest education level: Not on file  Occupational History  . Not on file  Social Needs  . Financial resource strain: Not on file  . Food insecurity    Worry: Not on file    Inability: Not on file  . Transportation needs    Medical: Not on file    Non-medical: Not on file  Tobacco Use  . Smoking status: Never Smoker  . Smokeless tobacco: Never Used  Substance and Sexual Activity  . Alcohol use: No    Alcohol/week: 0.0 standard drinks  . Drug use: No  . Sexual activity: Yes    Partners: Male  Lifestyle  . Physical activity    Days per week: Not on file    Minutes per session: Not on file  . Stress: Not on file  Relationships  . Social Herbalist on phone: Not on file    Gets together: Not on file    Attends religious service: Not on file    Active member of club or organization: Not on file    Attends meetings of clubs or organizations: Not on file    Relationship status: Not on file  . Intimate partner violence    Fear of current or ex partner: Not on file    Emotionally abused: Not on file    Physically abused: Not on file    Forced sexual activity: Not on file  Other Topics Concern  . Not on file  Social History Narrative  . Not on file      Review of Systems  Constitutional: Negative for chills, diaphoresis and fatigue.  HENT: Negative for ear  pain, postnasal drip and sinus pressure.   Respiratory: Negative for cough, shortness of breath and wheezing.   Cardiovascular: Negative for chest pain, palpitations and leg swelling.       Blood pressure is slightly elevated today.  Gastrointestinal: Positive for constipation. Negative for abdominal pain, diarrhea, nausea and vomiting.  Endocrine: Negative for cold intolerance, heat intolerance, polydipsia and polyuria.       Recent labs show high glucose and HgbA1c of 8.5  Musculoskeletal: Negative for arthralgias, back pain, gait  problem and neck pain.  Skin: Negative for color change.  Allergic/Immunologic: Negative for environmental allergies and food allergies.  Neurological: Negative for dizziness and headaches.  Hematological: Does not bruise/bleed easily.  Psychiatric/Behavioral: Negative for agitation, behavioral problems (depression) and hallucinations.    Today's Vitals   04/07/19 1124  BP: (!) 152/78  Pulse: 76  Resp: 16  Weight: 200 lb (90.7 kg)  Height: 5\' 6"  (1.676 m)   Body mass index is 32.28 kg/m.  Observation/Objective:   The patient is alert and oriented. She is pleasant and answers all questions appropriately. Breathing is non-labored. She is in no acute distress at this time.    Assessment/Plan:  1. Diabetes mellitus without complication (HCC) 123456 on labs 8.5 with blood sugar 154. Start farxiga 5mg  daily. Will see patient next week to review instructions to use glucometer so she can monitor her blood sugars closely.  - dapagliflozin propanediol (FARXIGA) 5 MG TABS tablet; Take 5 mg by mouth daily before breakfast.  Dispense: 30 tablet; Refill: 2  2. Irritable bowel syndrome with constipation New prescription for amitiza 64mcg twice daily. Recommend she take in morning and just before bed.  - lubiprostone (AMITIZA) 8 MCG capsule; Take 1 capsule (8 mcg total) by mouth 2 (two) times daily with a meal.  Dispense: 60 capsule; Refill: 2  3. Benign  hypertension Generally stable, though elevated today. No changes in BP medication. Added Wilder Glade which should help lower blood pressure. Monitor closely.    General Counseling: kaida maro understanding of the findings of today's phone visit and agrees with plan of treatment. I have discussed any further diagnostic evaluation that may be needed or ordered today. We also reviewed her medications today. she has been encouraged to call the office with any questions or concerns that should arise related to todays visit.   Diabetes Counseling:  1. Addition of ACE inh/ ARB'S for nephroprotection. Microalbumin is updated  2. Diabetic foot care, prevention of complications. Podiatry consult 3. Exercise and lose weight.  4. Diabetic eye examination, Diabetic eye exam is updated  5. Monitor blood sugar closlely. nutrition counseling.  6. Sign and symptoms of hypoglycemia including shaking sweating,confusion and headaches.  This patient was seen by [place] with Dr Lavera Guise as a part of collaborative care agreement  Meds ordered this encounter  Medications  . dapagliflozin propanediol (FARXIGA) 5 MG TABS tablet    Sig: Take 5 mg by mouth daily before breakfast.    Dispense:  30 tablet    Refill:  2    Order Specific Question:   Supervising Provider    Answer:   Lavera Guise [place]  . lubiprostone (AMITIZA) 8 MCG capsule    Sig: Take 1 capsule (8 mcg total) by mouth 2 (two) times daily with a meal.    Dispense:  60 capsule    Refill:  2    Order Specific Question:   Supervising Provider    Answer:   Lavera Guise [id number]    Time spent: 36 Minutes    Dr Lavera Guise Internal medicine

## 2019-04-20 ENCOUNTER — Ambulatory Visit: Payer: BLUE CROSS/BLUE SHIELD | Admitting: Adult Health

## 2019-04-20 ENCOUNTER — Telehealth: Payer: Self-pay

## 2019-04-20 NOTE — Telephone Encounter (Signed)
Confirmed appointment with patient. klh °

## 2019-04-22 ENCOUNTER — Other Ambulatory Visit: Payer: Self-pay

## 2019-04-22 ENCOUNTER — Encounter: Payer: Self-pay | Admitting: Adult Health

## 2019-04-22 ENCOUNTER — Ambulatory Visit (INDEPENDENT_AMBULATORY_CARE_PROVIDER_SITE_OTHER): Payer: BLUE CROSS/BLUE SHIELD | Admitting: Adult Health

## 2019-04-22 VITALS — BP 138/82 | HR 86 | Resp 16 | Ht 66.0 in | Wt 204.0 lb

## 2019-04-22 DIAGNOSIS — E785 Hyperlipidemia, unspecified: Secondary | ICD-10-CM | POA: Diagnosis not present

## 2019-04-22 DIAGNOSIS — E119 Type 2 diabetes mellitus without complications: Secondary | ICD-10-CM

## 2019-04-22 DIAGNOSIS — E039 Hypothyroidism, unspecified: Secondary | ICD-10-CM

## 2019-04-22 DIAGNOSIS — I10 Essential (primary) hypertension: Secondary | ICD-10-CM | POA: Diagnosis not present

## 2019-04-22 DIAGNOSIS — K219 Gastro-esophageal reflux disease without esophagitis: Secondary | ICD-10-CM

## 2019-04-22 NOTE — Progress Notes (Signed)
[facility], [place] [address]  Internal MEDICINE  Office Visit Note  Patient Name: Judy Graves  [id number]  [id number]  Date of Service: 04/22/2019  Chief Complaint  Patient presents with  . Medical Management of Chronic Issues  . Diabetes  . Hypertension    HPI  Pt is seen today for follow up on HTN and DM.  Her bp is initially elevated, however at recheck was 138/82.  Pt Denies Chest pain, Shortness of breath, palpitations, headache, or blurred vision.  She reports overall she feels good.  Denies any recent hospitalizations or issues.  Pt has glucometer in office today.  She is here to review glucometer operation and discuss dietary changes.    Current Medication: Outpatient Encounter Medications as of 04/22/2019  Medication Sig  . ASPIRIN LOW DOSE 81 MG EC tablet TAKE 1 TABLET DAILY  . atorvastatin (LIPITOR) 40 MG tablet Take 1 tablet (40 mg total) by mouth daily.  . chlorthalidone (HYGROTON) 25 MG tablet Take 1 tablet (25 mg total) by mouth daily.  . dapagliflozin propanediol (FARXIGA) 5 MG TABS tablet Take 5 mg by mouth daily before breakfast.  . EPIPEN 2-PAK 0.3 MG/0.3ML SOAJ injection AS DIRECTED AS DIRECTED INJECTION 2  . lansoprazole (PREVACID) 30 MG capsule Take 1 capsule (30 mg total) by mouth daily.  Marland Kitchen levothyroxine (SYNTHROID) 50 MCG tablet TAKE 1 TABLET DAILY AND 2 TABLETS ON SUNDAYS ( KEEP UPCOMING APPOINTMENT WITH DR. Ancil Boozer FOR REFILLS )  . lubiprostone (AMITIZA) 8 MCG capsule Take 1 capsule (8 mcg total) by mouth 2 (two) times daily with a meal.  . metoprolol succinate (TOPROL-XL) 50 MG 24 hr tablet Take 1 tablet (50 mg total) by mouth daily.   No facility-administered encounter medications on file as of 04/22/2019.    Surgical History: Past Surgical History:  Procedure Laterality Date  . CARPAL TUNNEL RELEASE Right 2010   Dr. Sabra Heck  . CATARACT EXTRACTION W/PHACO Left 11/02/2015   Procedure: CATARACT EXTRACTION PHACO  AND INTRAOCULAR LENS PLACEMENT ([place]) left eye;  Surgeon: Leandrew Koyanagi, MD;  Location: [place];  Service: Ophthalmology;  Laterality: Left;  . CHONDROPLASTY Left 11/14/2015   Procedure: CHONDROPLASTY;  Surgeon: Dereck Leep, MD;  Location: [facility];  Service: Orthopedics;  Laterality: Left;  . KNEE ARTHROSCOPY WITH LATERAL MENISECTOMY Left 11/14/2015   Procedure: KNEE ARTHROSCOPY WITH LATERAL MENISECTOMY;  Surgeon: Dereck Leep, MD;  Location: [facility];  Service: Orthopedics;  Laterality: Left;  . KNEE ARTHROSCOPY WITH MEDIAL MENISECTOMY Left 11/14/2015   Procedure: KNEE ARTHROSCOPY WITH MEDIAL MENISECTOMY;  Surgeon: Dereck Leep, MD;  Location: [facility];  Service: Orthopedics;  Laterality: Left;  . TONSILLECTOMY    . TUBAL LIGATION      Medical History: Past Medical History:  Diagnosis Date  . Allergy   . Arthritis    in knee  . Chronic constipation   . Chronic insomnia   . GERD (gastroesophageal reflux disease)   . Hemorrhoids   . Hypertension   . Hypothyroidism, adult   . Impingement syndrome of right shoulder    [facility] subacromial bursitis and tendinitis right shoulder  . Lumbar spinal stenosis    Dr. Phyllis Ginger  . Lump or mass in breast    Dr. Jamal Collin evaluated, likely lipoma  . Menopause   . Metabolic syndrome   . Mild carpal tunnel syndrome of right wrist   . Obesity (BMI 35.0-39.9 without comorbidity)   . Proteinuria  Family History: Family History  Problem Relation Age of Onset  . Cancer Mother        Breast  . Diabetes Mother   . Breast cancer Mother   . Hypertension Father   . CVA Father        55's  . Hypertension Sister   . Hypertension Brother   . Breast cancer Paternal Aunt     Social History   Socioeconomic History  . Marital status: Married    Spouse name: Not on file  . Number of children: Not on file  . Years of education: Not on file  . Highest education level: Not on file  Occupational History  . Not on  file  Tobacco Use  . Smoking status: Never Smoker  . Smokeless tobacco: Never Used  Substance and Sexual Activity  . Alcohol use: No    Alcohol/week: 0.0 standard drinks  . Drug use: No  . Sexual activity: Yes    Partners: Male  Other Topics Concern  . Not on file  Social History Narrative  . Not on file   Social Determinants of Health   Financial Resource Strain:   . Difficulty of Paying Living Expenses: Not on file  Food Insecurity:   . Worried About Charity fundraiser in the Last Year: Not on file  . Ran Out of Food in the Last Year: Not on file  Transportation Needs:   . Lack of Transportation (Medical): Not on file  . Lack of Transportation (Non-Medical): Not on file  Physical Activity:   . Days of Exercise per Week: Not on file  . Minutes of Exercise per Session: Not on file  Stress:   . Feeling of Stress : Not on file  Social Connections:   . Frequency of Communication with Friends and Family: Not on file  . Frequency of Social Gatherings with Friends and Family: Not on file  . Attends Religious Services: Not on file  . Active Member of Clubs or Organizations: Not on file  . Attends Archivist Meetings: Not on file  . Marital Status: Not on file  Intimate Partner Violence:   . Fear of Current or Ex-Partner: Not on file  . Emotionally Abused: Not on file  . Physically Abused: Not on file  . Sexually Abused: Not on file      Review of Systems  Constitutional: Negative for chills, fatigue and unexpected weight change.  HENT: Negative for congestion, rhinorrhea, sneezing and sore throat.   Eyes: Negative for photophobia, pain and redness.  Respiratory: Negative for cough, chest tightness and shortness of breath.   Cardiovascular: Negative for chest pain and palpitations.  Gastrointestinal: Negative for abdominal pain, constipation, diarrhea, nausea and vomiting.  Endocrine: Negative.   Genitourinary: Negative for dysuria and frequency.   Musculoskeletal: Negative for arthralgias, back pain, joint swelling and neck pain.  Skin: Negative for rash.  Allergic/Immunologic: Negative.   Neurological: Negative for tremors and numbness.  Hematological: Negative for adenopathy. Does not bruise/bleed easily.  Psychiatric/Behavioral: Negative for behavioral problems and sleep disturbance. The patient is not nervous/anxious.     Vital Signs: BP 138/82   Pulse 86   Resp 16   Ht 5\' 6"  (1.676 m)   Wt 204 lb (92.5 kg)   SpO2 95%   BMI 32.93 kg/m    Physical Exam Vitals and nursing note reviewed.  Constitutional:      General: She is not in acute distress.    Appearance: She is  well-developed. She is not diaphoretic.  HENT:     Head: Normocephalic and atraumatic.     Mouth/Throat:     Pharynx: No oropharyngeal exudate.  Eyes:     Pupils: Pupils are equal, round, and reactive to light.  Neck:     Thyroid: No thyromegaly.     Vascular: No JVD.     Trachea: No tracheal deviation.  Cardiovascular:     Rate and Rhythm: Normal rate and regular rhythm.     Heart sounds: Normal heart sounds. No murmur. No friction rub. No gallop.   Pulmonary:     Effort: Pulmonary effort is normal. No respiratory distress.     Breath sounds: Normal breath sounds. No wheezing or rales.  Chest:     Chest wall: No tenderness.  Abdominal:     Palpations: Abdomen is soft.     Tenderness: There is no abdominal tenderness. There is no guarding.  Musculoskeletal:        General: Normal range of motion.     Cervical back: Normal range of motion and neck supple.  Lymphadenopathy:     Cervical: No cervical adenopathy.  Skin:    General: Skin is warm and dry.  Neurological:     Mental Status: She is alert and oriented to person, place, and time.     Cranial Nerves: No cranial nerve deficit.  Psychiatric:        Behavior: Behavior normal.        Thought Content: Thought content normal.        Judgment: Judgment normal.    Assessment/Plan: 1.  Benign hypertension WNL, continue to monitor.  2. Diabetes mellitus without complication ([place]) We discussed glucometer operation, and visual/hands on instruction with return demonstration provided by CMA.  We also had a lengthy discussion about dietary cautions and restrictions.  We discussed long term issues associated with DM such as Eye, kidney, and foot cautions.    3. Hyperlipidemia, unspecified hyperlipidemia type Continue present management.   4. Gastro-esophageal reflux disease without esophagitis Stable, continue present management.   5. Hypothyroidism, unspecified type Stable, continue current therapy.   General Counseling: tanyia hyre understanding of the findings of todays visit and agrees with plan of treatment. I have discussed any further diagnostic evaluation that may be needed or ordered today. We also reviewed her medications today. she has been encouraged to call the office with any questions or concerns that should arise related to todays visit.    No orders of the defined types were placed in this encounter.   No orders of the defined types were placed in this encounter.   Time spent: 30 Minutes   This patient was seen by Orson Gear AGNP-C in Collaboration with Dr Lavera Guise as a part of collaborative care agreement     Kendell Bane AGNP-C Internal medicine

## 2019-04-24 ENCOUNTER — Telehealth: Payer: Self-pay

## 2019-04-24 NOTE — Telephone Encounter (Signed)
CONFIRMED AND SCREENED FOR 04-28-19 OV. 

## 2019-04-28 ENCOUNTER — Other Ambulatory Visit: Payer: Self-pay

## 2019-04-28 ENCOUNTER — Ambulatory Visit (INDEPENDENT_AMBULATORY_CARE_PROVIDER_SITE_OTHER): Payer: BLUE CROSS/BLUE SHIELD | Admitting: Nurse Practitioner

## 2019-04-28 VITALS — BP 148/69 | HR 80 | Temp 97.4°F | Resp 16 | Ht 66.0 in | Wt 202.0 lb

## 2019-04-28 DIAGNOSIS — K5909 Other constipation: Secondary | ICD-10-CM

## 2019-04-28 DIAGNOSIS — I10 Essential (primary) hypertension: Secondary | ICD-10-CM

## 2019-04-28 DIAGNOSIS — Z0001 Encounter for general adult medical examination with abnormal findings: Secondary | ICD-10-CM | POA: Diagnosis not present

## 2019-04-28 DIAGNOSIS — R300 Dysuria: Secondary | ICD-10-CM

## 2019-04-28 DIAGNOSIS — E119 Type 2 diabetes mellitus without complications: Secondary | ICD-10-CM

## 2019-04-28 DIAGNOSIS — E039 Hypothyroidism, unspecified: Secondary | ICD-10-CM

## 2019-04-28 MED ORDER — FARXIGA 10 MG PO TABS: 10.0000 mg | ORAL_TABLET | Freq: Every day | ORAL | 2 refills | Status: DC

## 2019-04-28 NOTE — Progress Notes (Signed)
[facility] [place], [place] [address]  Internal MEDICINE  Office Visit Note  Patient Name: Judy Graves  [id number]  [id number]  Date of Service: 05/06/2019   Pt is here for routine health maintenance examination  Chief Complaint  Patient presents with  . Medical Management of Chronic Issues  . Annual Exam  . Hypothyroidism  . Hypertension  . Hyperlipidemia     The patient is here for health maintenance exam. She is diabetic, recently started on Farxiga 5mg . Checking blood sugars daily. Sugars still elevated. She states that most recent sugars have been in the high 100s and low 200s. These are fasting blood sugars. She states that she has tolerated the additional medication well. She has no other concerns or complaints. She is scheduled for her screening mammogram 06/03/2019.    Current Medication: Outpatient Encounter Medications as of 04/28/2019  Medication Sig  . ASPIRIN LOW DOSE 81 MG EC tablet TAKE 1 TABLET DAILY  . atorvastatin (LIPITOR) 40 MG tablet Take 1 tablet (40 mg total) by mouth daily.  . chlorthalidone (HYGROTON) 25 MG tablet Take 1 tablet (25 mg total) by mouth daily.  Marland Kitchen EPIPEN 2-PAK 0.3 MG/0.3ML SOAJ injection AS DIRECTED AS DIRECTED INJECTION 2  . lansoprazole (PREVACID) 30 MG capsule Take 1 capsule (30 mg total) by mouth daily.  Marland Kitchen levothyroxine (SYNTHROID) 50 MCG tablet TAKE 1 TABLET DAILY AND 2 TABLETS ON SUNDAYS ( KEEP UPCOMING APPOINTMENT WITH DR. Ancil Boozer FOR REFILLS )  . lubiprostone (AMITIZA) 8 MCG capsule Take 1 capsule (8 mcg total) by mouth 2 (two) times daily with a meal.  . metoprolol succinate (TOPROL-XL) 50 MG 24 hr tablet Take 1 tablet (50 mg total) by mouth daily.  . [DISCONTINUED] dapagliflozin propanediol (FARXIGA) 5 MG TABS tablet Take 5 mg by mouth daily before breakfast.  . dapagliflozin propanediol (FARXIGA) 10 MG TABS tablet Take 10 mg by mouth daily.   No facility-administered encounter medications on file as  of 04/28/2019.    Surgical History: Past Surgical History:  Procedure Laterality Date  . CARPAL TUNNEL RELEASE Right 2010   Dr. Sabra Heck  . CATARACT EXTRACTION W/PHACO Left 11/02/2015   Procedure: CATARACT EXTRACTION PHACO AND INTRAOCULAR LENS PLACEMENT ([place]) left eye;  Surgeon: Leandrew Koyanagi, MD;  Location: [facility];  Service: Ophthalmology;  Laterality: Left;  . CHONDROPLASTY Left 11/14/2015   Procedure: CHONDROPLASTY;  Surgeon: Dereck Leep, MD;  Location: [facility];  Service: Orthopedics;  Laterality: Left;  . KNEE ARTHROSCOPY WITH LATERAL MENISECTOMY Left 11/14/2015   Procedure: KNEE ARTHROSCOPY WITH LATERAL MENISECTOMY;  Surgeon: Dereck Leep, MD;  Location: [facility];  Service: Orthopedics;  Laterality: Left;  . KNEE ARTHROSCOPY WITH MEDIAL MENISECTOMY Left 11/14/2015   Procedure: KNEE ARTHROSCOPY WITH MEDIAL MENISECTOMY;  Surgeon: Dereck Leep, MD;  Location: [facility];  Service: Orthopedics;  Laterality: Left;  . TONSILLECTOMY    . TUBAL LIGATION      Medical History: Past Medical History:  Diagnosis Date  . Allergy   . Arthritis    in knee  . Chronic constipation   . Chronic insomnia   . GERD (gastroesophageal reflux disease)   . Hemorrhoids   . Hypertension   . Hypothyroidism, adult   . Impingement syndrome of right shoulder    [facility] subacromial bursitis and tendinitis right shoulder  . Lumbar spinal stenosis    Dr. Phyllis Ginger  . Lump or mass in breast    Dr. Jamal Collin evaluated, likely lipoma  .  Menopause   . Metabolic syndrome   . Mild carpal tunnel syndrome of right wrist   . Obesity (BMI 35.0-39.9 without comorbidity)   . Proteinuria     Family History: Family History  Problem Relation Age of Onset  . Cancer Mother        Breast  . Diabetes Mother   . Breast cancer Mother   . Hypertension Father   . CVA Father        21's  . Hypertension Sister   . Hypertension Brother   . Breast cancer Paternal Aunt       Review of  Systems  Constitutional: Negative for activity change, chills, diaphoresis and fatigue.  HENT: Negative for ear pain, postnasal drip and sinus pressure.   Respiratory: Negative for cough, shortness of breath and wheezing.   Cardiovascular: Negative for chest pain and palpitations.       Blood pressure is slightly elevated today.  Gastrointestinal: Positive for constipation. Negative for abdominal pain, diarrhea, nausea and vomiting.  Endocrine: Negative for cold intolerance, heat intolerance, polydipsia and polyuria.       Added farxiga 5mg  daily at her last visit. She states that blood sugars are still running moderately elevated.   Genitourinary: Negative for dysuria, frequency and urgency.  Musculoskeletal: Negative for arthralgias, back pain, gait problem and neck pain.  Skin: Negative for color change.  Allergic/Immunologic: Negative for environmental allergies and food allergies.  Neurological: Negative for dizziness and headaches.  Hematological: Does not bruise/bleed easily.  Psychiatric/Behavioral: Negative for agitation, behavioral problems (depression) and hallucinations.   Today's Vitals   04/28/19 1535  BP: (!) 148/69  Pulse: 80  Resp: 16  Temp: (!) 97.4 F (36.3 C)  SpO2: 95%  Weight: 202 lb (91.6 kg)  Height: 5\' 6"  (1.676 m)   Body mass index is 32.6 kg/m.  Physical Exam Vitals and nursing note reviewed.  Constitutional:      General: She is not in acute distress.    Appearance: Normal appearance. She is well-developed. She is not diaphoretic.  HENT:     Head: Normocephalic and atraumatic.     Nose: Nose normal.     Mouth/Throat:     Pharynx: No oropharyngeal exudate.  Eyes:     Pupils: Pupils are equal, round, and reactive to light.  Neck:     Thyroid: No thyromegaly.     Vascular: No JVD.     Trachea: No tracheal deviation.  Cardiovascular:     Rate and Rhythm: Normal rate and regular rhythm.     Pulses: Normal pulses.     Heart sounds: Normal heart  sounds. No murmur. No friction rub. No gallop.   Pulmonary:     Effort: Pulmonary effort is normal. No respiratory distress.     Breath sounds: Normal breath sounds. No wheezing or rales.  Chest:     Chest wall: No tenderness.     Breasts:        Right: Normal. No swelling, bleeding, inverted nipple, mass, nipple discharge, skin change or tenderness.        Left: Normal. No swelling, bleeding, inverted nipple, mass, nipple discharge, skin change or tenderness.  Abdominal:     General: Bowel sounds are normal.     Palpations: Abdomen is soft.     Tenderness: There is no abdominal tenderness.  Musculoskeletal:        General: Normal range of motion.     Cervical back: Normal range of motion and neck supple.  Lymphadenopathy:     Cervical: No cervical adenopathy.     Upper Body:     Right upper body: No axillary adenopathy.     Left upper body: No axillary adenopathy.  Skin:    General: Skin is warm and dry.  Neurological:     Mental Status: She is alert and oriented to person, place, and time.     Cranial Nerves: No cranial nerve deficit.  Psychiatric:        Behavior: Behavior normal.        Thought Content: Thought content normal.        Judgment: Judgment normal.      LABS: Recent Results (from the past 2160 hour(s))  CBC with Differential/Platelet     Status: Abnormal   Collection Time: 03/04/19  1:59 PM  Result Value Ref Range   WBC 8.4 3.4 - 10.8 x10E3/uL   RBC 4.74 3.77 - 5.28 x10E6/uL   Hemoglobin 15.0 11.1 - 15.9 g/dL   Hematocrit 42.1 34.0 - 46.6 %   MCV 89 79 - 97 fL   MCH 31.6 26.6 - 33.0 pg   MCHC 35.6 31.5 - 35.7 g/dL   RDW 12.3 11.7 - 15.4 %   Platelets 206 150 - 450 x10E3/uL   Neutrophils 49 Not Estab. %   Lymphs 41 Not Estab. %   Monocytes 8 Not Estab. %   Eos 1 Not Estab. %   Basos 1 Not Estab. %   Neutrophils Absolute 4.2 1.4 - 7.0 x10E3/uL   Lymphocytes Absolute 3.4 (H) 0.7 - 3.1 x10E3/uL   Monocytes Absolute 0.7 0.1 - 0.9 x10E3/uL   EOS  (ABSOLUTE) 0.0 0.0 - 0.4 x10E3/uL   Basophils Absolute 0.0 0.0 - 0.2 x10E3/uL   Immature Granulocytes 0 Not Estab. %   Immature Grans (Abs) 0.0 0.0 - 0.1 x10E3/uL  Lipid Panel With LDL/HDL Ratio     Status: None   Collection Time: 03/04/19  1:59 PM  Result Value Ref Range   Cholesterol, Total 155 100 - 199 mg/dL   Triglycerides 91 0 - 149 mg/dL   HDL 46 >39 mg/dL   VLDL Cholesterol Cal 17 5 - 40 mg/dL   LDL Chol Calc (NIH) 92 0 - 99 mg/dL   LDL/HDL Ratio 2.0 0.0 - 3.2 ratio    Comment:                                     LDL/HDL Ratio                                             Men  Women                               1/2 Avg.Risk  1.0    1.5                                   Avg.Risk  3.6    3.2                                2X Avg.Risk  6.2  5.0                                3X Avg.Risk  8.0    6.1   TSH     Status: None   Collection Time: 03/04/19  1:59 PM  Result Value Ref Range   TSH 2.030 0.450 - 4.500 uIU/mL  T4, free     Status: None   Collection Time: 03/04/19  1:59 PM  Result Value Ref Range   Free T4 1.54 0.82 - 1.77 ng/dL  Comprehensive metabolic panel     Status: Abnormal   Collection Time: 03/04/19  1:59 PM  Result Value Ref Range   Glucose 154 (H) 65 - 99 mg/dL   BUN 20 8 - 27 mg/dL   Creatinine, Ser 1.04 (H) 0.57 - 1.00 mg/dL   GFR calc non Af Amer 58 (L) >59 mL/min/1.73   GFR calc Af Amer 67 >59 mL/min/1.73   BUN/Creatinine Ratio 19 12 - 28   Sodium 140 134 - 144 mmol/L   Potassium 3.9 3.5 - 5.2 mmol/L   Chloride 98 96 - 106 mmol/L   CO2 28 20 - 29 mmol/L   Calcium 9.9 8.7 - 10.3 mg/dL   Total Protein 7.5 6.0 - 8.5 g/dL   Albumin 4.4 3.8 - 4.8 g/dL   Globulin, Total 3.1 1.5 - 4.5 g/dL   Albumin/Globulin Ratio 1.4 1.2 - 2.2   Bilirubin Total 0.4 0.0 - 1.2 mg/dL   Alkaline Phosphatase 92 39 - 117 IU/L   AST 24 0 - 40 IU/L   ALT 38 (H) 0 - 32 IU/L  Hgb A1c w/o eAG     Status: Abnormal   Collection Time: 03/04/19  1:59 PM  Result Value Ref Range    Hgb A1c MFr Bld 8.5 (H) 4.8 - 5.6 %    Comment:          Prediabetes: 5.7 - 6.4          Diabetes: >6.4          Glycemic control for adults with diabetes: <7.0   Specimen status report     Status: None   Collection Time: 03/04/19  1:59 PM  Result Value Ref Range   specimen status report Comment     Comment: Written Authorization Written Authorization Written Authorization Received. Authorization received from [employer] 03-10-2019 Logged by Leona Carry   UA/M w/rflx Culture, Routine     Status: Abnormal   Collection Time: 04/28/19  3:42 PM   Specimen: Urine   URINE  Result Value Ref Range   Specific Gravity, UA      >=1.030 (A) 1.005 - 1.030   pH, UA 5.5 5.0 - 7.5   Color, UA Yellow Yellow   Appearance Ur Clear Clear   Leukocytes,UA Negative Negative   Protein,UA Negative Negative/Trace   Glucose, UA 3+ (A) Negative   Ketones, UA Negative Negative   RBC, UA Negative Negative   Bilirubin, UA Negative Negative   Urobilinogen, Ur 0.2 0.2 - 1.0 mg/dL   Nitrite, UA Negative Negative   Microscopic Examination Comment     Comment: Microscopic follows if indicated.   Microscopic Examination See below:     Comment: Microscopic was indicated and was performed.   Urinalysis Reflex Comment     Comment: This specimen will not reflex to a Urine Culture.  Microscopic Examination     Status: None  Collection Time: 04/28/19  3:42 PM   URINE  Result Value Ref Range   WBC, UA 0-5 0 - 5 /hpf   RBC 0-2 0 - 2 /hpf   Epithelial Cells (non renal) 0-10 0 - 10 /hpf   Casts None seen None seen /lpf   Mucus, UA Present Not Estab.   Bacteria, UA Few None seen/Few    Assessment/Plan:  1. Encounter for general adult medical examination with abnormal findings Annual health maintenance exam  2. Diabetes mellitus without complication ([place]) Increase farxiga to 10mg  daily. Continue to monitor blood sugars closely - dapagliflozin propanediol (FARXIGA) 10 MG TABS tablet; Take 10 mg by  mouth daily.  Dispense: 30 tablet; Refill: 2  3. Benign hypertension Stable. Continue bp medication as prescribed   4. Chronic constipation Continue amitiza as prescribed   5. Hypothyroidism, unspecified type Stable. Continue levothyroxine as prescribed   6. Dysuria - UA/M w/rflx Culture, Routine  General Counseling: Allice verbalizes understanding of the findings of todays visit and agrees with plan of treatment. I have discussed any further diagnostic evaluation that may be needed or ordered today. We also reviewed her medications today. she has been encouraged to call the office with any questions or concerns that should arise related to todays visit.    Counseling:  Diabetes Counseling:  1. Addition of ACE inh/ ARB'S for nephroprotection. Microalbumin is updated  2. Diabetic foot care, prevention of complications. Podiatry consult 3. Exercise and lose weight.  4. Diabetic eye examination, Diabetic eye exam is updated  5. Monitor blood sugar closlely. nutrition counseling.  6. Sign and symptoms of hypoglycemia including shaking sweating,confusion and headaches.   This patient was seen by Leretha Pol FNP Collaboration with Dr Lavera Guise as a part of collaborative care agreement  Orders Placed This Encounter  Procedures  . Microscopic Examination  . UA/M w/rflx Culture, Routine    Meds ordered this encounter  Medications  . dapagliflozin propanediol (FARXIGA) 10 MG TABS tablet    Sig: Take 10 mg by mouth daily.    Dispense:  30 tablet    Refill:  2    Please discontinue farxiga 5mg . Increased dose    Order Specific Question:   Supervising Provider    Answer:   Lavera Guise [id number]    Time spent: [place], MD  Internal Medicine

## 2019-04-29 LAB — UA/M W/RFLX CULTURE, ROUTINE
Bilirubin, UA: NEGATIVE
Ketones, UA: NEGATIVE
Leukocytes,UA: NEGATIVE
Nitrite, UA: NEGATIVE
Protein,UA: NEGATIVE
RBC, UA: NEGATIVE
Specific Gravity, UA: >=1.03 — AB (ref 1.005–1.030)
Urobilinogen, Ur: 0.2 mg/dL (ref 0.2–1.0)
pH, UA: 5.5 (ref 5.0–7.5)

## 2019-04-29 LAB — MICROSCOPIC EXAMINATION: Casts: NONE SEEN /lpf

## 2019-05-06 ENCOUNTER — Encounter: Payer: Self-pay | Admitting: Nurse Practitioner

## 2019-05-07 ENCOUNTER — Other Ambulatory Visit: Payer: Self-pay | Admitting: Adult Health

## 2019-05-07 DIAGNOSIS — E785 Hyperlipidemia, unspecified: Secondary | ICD-10-CM

## 2019-05-18 ENCOUNTER — Other Ambulatory Visit: Payer: Self-pay | Admitting: Adult Health

## 2019-05-18 DIAGNOSIS — I10 Essential (primary) hypertension: Secondary | ICD-10-CM

## 2019-06-03 ENCOUNTER — Ambulatory Visit
Admission: RE | Admit: 2019-06-03 | Discharge: 2019-06-03 | Disposition: A | Discharge status: Home / Self Care | Payer: BLUE CROSS/BLUE SHIELD | Source: Ambulatory Visit | Attending: Internal Medicine | Admitting: Internal Medicine

## 2019-06-03 DIAGNOSIS — Z1231 Encounter for screening mammogram for malignant neoplasm of breast: Secondary | ICD-10-CM

## 2019-06-16 ENCOUNTER — Telehealth: Payer: Self-pay

## 2019-06-16 NOTE — Telephone Encounter (Signed)
CONFIRMED AND SCREENED FOR 06-18-19 OV. 

## 2019-06-18 ENCOUNTER — Ambulatory Visit (INDEPENDENT_AMBULATORY_CARE_PROVIDER_SITE_OTHER): Payer: BLUE CROSS/BLUE SHIELD | Admitting: Nurse Practitioner

## 2019-06-18 ENCOUNTER — Encounter: Payer: Self-pay | Admitting: Nurse Practitioner

## 2019-06-18 ENCOUNTER — Other Ambulatory Visit: Payer: Self-pay

## 2019-06-18 VITALS — BP 139/76 | HR 88 | Temp 97.3°F | Resp 16 | Ht 66.0 in | Wt 198.8 lb

## 2019-06-18 DIAGNOSIS — E1165 Type 2 diabetes mellitus with hyperglycemia: Secondary | ICD-10-CM | POA: Diagnosis not present

## 2019-06-18 DIAGNOSIS — K581 Irritable bowel syndrome with constipation: Secondary | ICD-10-CM

## 2019-06-18 DIAGNOSIS — I10 Essential (primary) hypertension: Secondary | ICD-10-CM

## 2019-06-18 DIAGNOSIS — E038 Other specified hypothyroidism: Secondary | ICD-10-CM

## 2019-06-18 DIAGNOSIS — E119 Type 2 diabetes mellitus without complications: Secondary | ICD-10-CM

## 2019-06-18 DIAGNOSIS — E785 Hyperlipidemia, unspecified: Secondary | ICD-10-CM

## 2019-06-18 LAB — POCT GLYCOSYLATED HEMOGLOBIN (HGB A1C): Hemoglobin A1C: 7.2 % — AB (ref 4.0–5.6)

## 2019-06-18 MED ORDER — ATORVASTATIN CALCIUM 40 MG PO TABS: 40.0000 mg | ORAL_TABLET | Freq: Every day | ORAL | 3 refills | Status: DC

## 2019-06-18 MED ORDER — METOPROLOL SUCCINATE ER 50 MG PO TB24: 50.0000 mg | ORAL_TABLET | Freq: Every day | ORAL | 1 refills | Status: DC

## 2019-06-18 MED ORDER — FARXIGA 10 MG PO TABS: 10.0000 mg | ORAL_TABLET | Freq: Every day | ORAL | 3 refills | Status: DC

## 2019-06-18 MED ORDER — LANSOPRAZOLE 30 MG PO CPDR: 30.0000 mg | DELAYED_RELEASE_CAPSULE | Freq: Every day | ORAL | 1 refills | Status: DC

## 2019-06-18 MED ORDER — LEVOTHYROXINE SODIUM 50 MCG PO TABS: ORAL_TABLET | ORAL | 1 refills | Status: DC

## 2019-06-18 MED ORDER — LUBIPROSTONE 8 MCG PO CAPS: 8.0000 ug | ORAL_CAPSULE | Freq: Two times a day (BID) | ORAL | 3 refills | Status: DC

## 2019-06-18 NOTE — Progress Notes (Signed)
[facility] [place], [place] [address]  Internal MEDICINE  Office Visit Note  Patient Name: Judy Graves  [id number]  [id number]  Date of Service: 06/21/2019  Chief Complaint  Patient presents with  . Diabetes  . Gastroesophageal Reflux  . Hypertension  . Arthritis    The patient is here for routine follow up visit. Blood sugars are improved. Her HgbA1c is 7.2, down from 8.5 today. She is tolerating the [place] 10mg  well. Has no negative side effects to report. Blood pressure is well managed. She still experiences some constipation, but taking Amitiza twice daily keeps this well managed. Most recent thyroid panel done 02/2019 and was well managed.      Current Medication: Outpatient Encounter Medications as of 06/18/2019  Medication Sig  . ASPIRIN LOW DOSE 81 MG EC tablet TAKE 1 TABLET DAILY  . atorvastatin (LIPITOR) 40 MG tablet Take 1 tablet (40 mg total) by mouth daily.  . chlorthalidone (HYGROTON) 25 MG tablet TAKE ONE TABLET BY MOUTH DAILY *INCREASE POTASSIUM INTAKE WITH DIET*  . dapagliflozin propanediol (FARXIGA) 10 MG TABS tablet Take 10 mg by mouth daily.  Marland Kitchen EPIPEN 2-PAK 0.3 MG/0.3ML SOAJ injection AS DIRECTED AS DIRECTED INJECTION 2  . lansoprazole (PREVACID) 30 MG capsule Take 1 capsule (30 mg total) by mouth daily.  Marland Kitchen levothyroxine (SYNTHROID) 50 MCG tablet TAKE 1 TABLET DAILY AND 2 TABLETS ON SUNDAYS  . lubiprostone (AMITIZA) 8 MCG capsule Take 1 capsule (8 mcg total) by mouth 2 (two) times daily with a meal.  . metoprolol succinate (TOPROL-XL) 50 MG 24 hr tablet Take 1 tablet (50 mg total) by mouth daily.  . [DISCONTINUED] atorvastatin (LIPITOR) 40 MG tablet TAKE ONE TABLET BY MOUTH DAILY  . [DISCONTINUED] dapagliflozin propanediol (FARXIGA) 10 MG TABS tablet Take 10 mg by mouth daily.  . [DISCONTINUED] lansoprazole (PREVACID) 30 MG capsule Take 1 capsule (30 mg total) by mouth daily.  . [DISCONTINUED] levothyroxine (SYNTHROID) 50 MCG  tablet TAKE 1 TABLET DAILY AND 2 TABLETS ON SUNDAYS ( KEEP UPCOMING APPOINTMENT WITH DR. Ancil Boozer FOR REFILLS )  . [DISCONTINUED] lubiprostone (AMITIZA) 8 MCG capsule Take 1 capsule (8 mcg total) by mouth 2 (two) times daily with a meal.  . [DISCONTINUED] metoprolol succinate (TOPROL-XL) 50 MG 24 hr tablet Take 1 tablet (50 mg total) by mouth daily.   No facility-administered encounter medications on file as of 06/18/2019.    Surgical History: Past Surgical History:  Procedure Laterality Date  . CARPAL TUNNEL RELEASE Right 2010   Dr. Sabra Heck  . CATARACT EXTRACTION W/PHACO Left 11/02/2015   Procedure: CATARACT EXTRACTION PHACO AND INTRAOCULAR LENS PLACEMENT ([place]) left eye;  Surgeon: Leandrew Koyanagi, MD;  Location: [place];  Service: Ophthalmology;  Laterality: Left;  . CHONDROPLASTY Left 11/14/2015   Procedure: CHONDROPLASTY;  Surgeon: Dereck Leep, MD;  Location: [facility];  Service: Orthopedics;  Laterality: Left;  . KNEE ARTHROSCOPY WITH LATERAL MENISECTOMY Left 11/14/2015   Procedure: KNEE ARTHROSCOPY WITH LATERAL MENISECTOMY;  Surgeon: Dereck Leep, MD;  Location: [facility];  Service: Orthopedics;  Laterality: Left;  . KNEE ARTHROSCOPY WITH MEDIAL MENISECTOMY Left 11/14/2015   Procedure: KNEE ARTHROSCOPY WITH MEDIAL MENISECTOMY;  Surgeon: Dereck Leep, MD;  Location: [facility];  Service: Orthopedics;  Laterality: Left;  . TONSILLECTOMY    . TUBAL LIGATION      Medical History: Past Medical History:  Diagnosis Date  . Allergy   . Arthritis    in knee  . Chronic  constipation   . Chronic insomnia   . GERD (gastroesophageal reflux disease)   . Hemorrhoids   . Hypertension   . Hypothyroidism, adult   . Impingement syndrome of right shoulder    [facility] subacromial bursitis and tendinitis right shoulder  . Lumbar spinal stenosis    Dr. Phyllis Ginger  . Lump or mass in breast    Dr. Jamal Collin evaluated, likely lipoma  . Menopause   . Metabolic syndrome   . Mild  carpal tunnel syndrome of right wrist   . Obesity (BMI 35.0-39.9 without comorbidity)   . Proteinuria     Family History: Family History  Problem Relation Age of Onset  . Cancer Mother        Breast  . Diabetes Mother   . Breast cancer Mother   . Hypertension Father   . CVA Father        63's  . Hypertension Sister   . Hypertension Brother   . Breast cancer Paternal Aunt     Social History   Socioeconomic History  . Marital status: Married    Spouse name: Not on file  . Number of children: Not on file  . Years of education: Not on file  . Highest education level: Not on file  Occupational History  . Not on file  Tobacco Use  . Smoking status: Never Smoker  . Smokeless tobacco: Never Used  Substance and Sexual Activity  . Alcohol use: No    Alcohol/week: 0.0 standard drinks  . Drug use: No  . Sexual activity: Yes    Partners: Male  Other Topics Concern  . Not on file  Social History Narrative  . Not on file   Social Determinants of Health   Financial Resource Strain:   . Difficulty of Paying Living Expenses: Not on file  Food Insecurity:   . Worried About Charity fundraiser in the Last Year: Not on file  . Ran Out of Food in the Last Year: Not on file  Transportation Needs:   . Lack of Transportation (Medical): Not on file  . Lack of Transportation (Non-Medical): Not on file  Physical Activity:   . Days of Exercise per Week: Not on file  . Minutes of Exercise per Session: Not on file  Stress:   . Feeling of Stress : Not on file  Social Connections:   . Frequency of Communication with Friends and Family: Not on file  . Frequency of Social Gatherings with Friends and Family: Not on file  . Attends Religious Services: Not on file  . Active Member of Clubs or Organizations: Not on file  . Attends Archivist Meetings: Not on file  . Marital Status: Not on file  Intimate Partner Violence:   . Fear of Current or Ex-Partner: Not on file  .  Emotionally Abused: Not on file  . Physically Abused: Not on file  . Sexually Abused: Not on file      Review of Systems  Constitutional: Negative for activity change, chills, diaphoresis and fatigue.  HENT: Negative for ear pain, postnasal drip and sinus pressure.   Respiratory: Negative for cough, shortness of breath and wheezing.   Cardiovascular: Negative for chest pain and palpitations.  Gastrointestinal: Positive for constipation. Negative for abdominal pain, diarrhea, nausea and vomiting.  Endocrine: Negative for cold intolerance, heat intolerance, polydipsia and polyuria.       Improved blood sugars since starting Farxiga 10mg  every day.   Musculoskeletal: Negative for arthralgias, back  pain, gait problem and neck pain.  Skin: Negative for color change.  Allergic/Immunologic: Negative for environmental allergies and food allergies.  Neurological: Negative for dizziness and headaches.  Hematological: Does not bruise/bleed easily.  Psychiatric/Behavioral: Negative for agitation, behavioral problems (depression) and hallucinations.    Today's Vitals   06/18/19 1416  BP: 139/76  Pulse: 88  Resp: 16  Temp: (!) 97.3 F (36.3 C)  SpO2: 94%  Weight: 198 lb 12.8 oz (90.2 kg)  Height: 5\' 6"  (1.676 m)   Body mass index is 32.09 kg/m.   Physical Exam Vitals and nursing note reviewed.  Constitutional:      General: She is not in acute distress.    Appearance: Normal appearance. She is well-developed. She is not diaphoretic.  HENT:     Head: Normocephalic and atraumatic.     Nose: Nose normal.     Mouth/Throat:     Pharynx: No oropharyngeal exudate.  Eyes:     Pupils: Pupils are equal, round, and reactive to light.  Neck:     Thyroid: No thyromegaly.     Vascular: No carotid bruit or JVD.     Trachea: No tracheal deviation.  Cardiovascular:     Rate and Rhythm: Normal rate and regular rhythm.     Heart sounds: Normal heart sounds. No murmur. No friction rub. No  gallop.   Pulmonary:     Effort: Pulmonary effort is normal. No respiratory distress.     Breath sounds: Normal breath sounds. No wheezing or rales.  Chest:     Chest wall: No tenderness.  Abdominal:     Palpations: Abdomen is soft.  Musculoskeletal:        General: Normal range of motion.     Cervical back: Normal range of motion and neck supple.  Lymphadenopathy:     Cervical: No cervical adenopathy.  Skin:    General: Skin is warm and dry.  Neurological:     Mental Status: She is alert and oriented to person, place, and time.     Cranial Nerves: No cranial nerve deficit.  Psychiatric:        Mood and Affect: Mood normal.        Behavior: Behavior normal.        Thought Content: Thought content normal.        Judgment: Judgment normal.    Assessment/Plan: 1. Type 2 diabetes mellitus with hyperglycemia, without long-term current use of insulin (HCC) - POCT HgB A1C 7.2 today, down from 8.5 at her last check. Continue farxiga 10mg  daily. Continue to monitor closely.   2. Benign hypertension Stable. Continue metoprolol XL as prescribed  - metoprolol succinate (TOPROL-XL) 50 MG 24 hr tablet; Take 1 tablet (50 mg total) by mouth daily.  Dispense: 90 tablet; Refill: 1  3. Irritable bowel syndrome with constipation Stable. Continue amitiza as prescribed  - lubiprostone (AMITIZA) 8 MCG capsule; Take 1 capsule (8 mcg total) by mouth 2 (two) times daily with a meal.  Dispense: 60 capsule; Refill: 3  4. Other specified hypothyroidism Thyroid panel normal at last check 02/2019. Continue levothyroxine as prescribed  - levothyroxine (SYNTHROID) 50 MCG tablet; TAKE 1 TABLET DAILY AND 2 TABLETS ON SUNDAYS  Dispense: 102 tablet; Refill: 1  5. Dyslipidemia Continue atorvastatin as prescribed  - atorvastatin (LIPITOR) 40 MG tablet; Take 1 tablet (40 mg total) by mouth daily.  Dispense: 90 tablet; Refill: 3    General Counseling: Lamanda verbalizes understanding of the findings of todays  visit  and agrees with plan of treatment. I have discussed any further diagnostic evaluation that may be needed or ordered today. We also reviewed her medications today. she has been encouraged to call the office with any questions or concerns that should arise related to todays visit.  Diabetes Counseling:  1. Addition of ACE inh/ ARB'S for nephroprotection. Microalbumin is updated  2. Diabetic foot care, prevention of complications. Podiatry consult 3. Exercise and lose weight.  4. Diabetic eye examination, Diabetic eye exam is updated  5. Monitor blood sugar closlely. nutrition counseling.  6. Sign and symptoms of hypoglycemia including shaking sweating,confusion and headaches.  This patient was seen by Leretha Pol FNP Collaboration with Dr Lavera Guise as a part of collaborative care agreement  Orders Placed This Encounter  Procedures  . POCT HgB A1C    Meds ordered this encounter  Medications  . atorvastatin (LIPITOR) 40 MG tablet    Sig: Take 1 tablet (40 mg total) by mouth daily.    Dispense:  90 tablet    Refill:  3    Order Specific Question:   Supervising Provider    Answer:   Lavera Guise [id number]  . dapagliflozin propanediol (FARXIGA) 10 MG TABS tablet    Sig: Take 10 mg by mouth daily.    Dispense:  30 tablet    Refill:  3    Please discontinue farxiga 5mg . Increased dose    Order Specific Question:   Supervising Provider    Answer:   Lavera Guise [place]  . lansoprazole (PREVACID) 30 MG capsule    Sig: Take 1 capsule (30 mg total) by mouth daily.    Dispense:  90 capsule    Refill:  1    Order Specific Question:   Supervising Provider    Answer:   Lavera Guise [id number]  . metoprolol succinate (TOPROL-XL) 50 MG 24 hr tablet    Sig: Take 1 tablet (50 mg total) by mouth daily.    Dispense:  90 tablet    Refill:  1    Order Specific Question:   Supervising Provider    Answer:   Lavera Guise [id number]  . lubiprostone (AMITIZA) 8 MCG capsule    Sig: Take 1 capsule  (8 mcg total) by mouth 2 (two) times daily with a meal.    Dispense:  60 capsule    Refill:  3    Order Specific Question:   Supervising Provider    Answer:   Lavera Guise [place]  . levothyroxine (SYNTHROID) 50 MCG tablet    Sig: TAKE 1 TABLET DAILY AND 2 TABLETS ON SUNDAYS    Dispense:  102 tablet    Refill:  1    Order Specific Question:   Supervising Provider    Answer:   Lavera Guise [id number]    Total time spent: 30 Minutes  Time spent includes review of chart, medications, test results, and follow up plan with the patient.      Dr Lavera Guise Internal medicine

## 2019-06-21 DIAGNOSIS — E1165 Type 2 diabetes mellitus with hyperglycemia: Secondary | ICD-10-CM | POA: Insufficient documentation

## 2019-08-17 ENCOUNTER — Other Ambulatory Visit: Payer: Self-pay | Admitting: Internal Medicine

## 2019-08-17 DIAGNOSIS — I10 Essential (primary) hypertension: Secondary | ICD-10-CM

## 2019-09-03 ENCOUNTER — Other Ambulatory Visit: Payer: Self-pay

## 2019-09-03 ENCOUNTER — Telehealth: Payer: Self-pay

## 2019-09-03 MED ORDER — ACCU-CHEK GUIDE VI STRP: ORAL_STRIP | 3 refills | Status: DC

## 2019-09-03 NOTE — Telephone Encounter (Signed)
lmom that which she had and how many times she used a test strips to check glucose

## 2019-09-15 ENCOUNTER — Telehealth: Payer: Self-pay

## 2019-09-15 NOTE — Telephone Encounter (Signed)
Confirmed and screened for 09-17-19 ov.

## 2019-09-17 ENCOUNTER — Encounter: Payer: Self-pay | Admitting: Nurse Practitioner

## 2019-09-17 ENCOUNTER — Ambulatory Visit: Payer: BLUE CROSS/BLUE SHIELD | Admitting: Nurse Practitioner

## 2019-09-17 ENCOUNTER — Other Ambulatory Visit: Payer: Self-pay

## 2019-09-17 VITALS — BP 139/67 | HR 75 | Temp 97.4°F | Resp 16 | Ht 66.0 in | Wt 196.4 lb

## 2019-09-17 DIAGNOSIS — I10 Essential (primary) hypertension: Secondary | ICD-10-CM

## 2019-09-17 DIAGNOSIS — E119 Type 2 diabetes mellitus without complications: Secondary | ICD-10-CM

## 2019-09-17 DIAGNOSIS — K5909 Other constipation: Secondary | ICD-10-CM | POA: Diagnosis not present

## 2019-09-17 DIAGNOSIS — E1165 Type 2 diabetes mellitus with hyperglycemia: Secondary | ICD-10-CM

## 2019-09-17 LAB — POCT GLYCOSYLATED HEMOGLOBIN (HGB A1C): Hemoglobin A1C: 7.1 % — AB (ref 4.0–5.6)

## 2019-09-17 MED ORDER — CHLORTHALIDONE 25 MG PO TABS: ORAL_TABLET | ORAL | 3 refills | Status: DC

## 2019-09-17 MED ORDER — FARXIGA 10 MG PO TABS: 10.0000 mg | ORAL_TABLET | Freq: Every day | ORAL | 3 refills | Status: DC

## 2019-09-17 NOTE — Progress Notes (Signed)
[facility] [place], [place] [address]  Internal MEDICINE  Office Visit Note  Patient Name: Judy Graves  [id number]  [id number]  Date of Service: 09/23/2019  Chief Complaint  Patient presents with  . Diabetes  . Gastroesophageal Reflux  . Hypertension  . Hypothyroidism  . Arthritis    The patient is here for routine follow. Blood sugars are managed well. Her HgbA1c is 7.1 today, down from 7.2 at the last check. Has had some intermittent epigastric/right sided chest pain. Last episode was a few weeks ago. Does resolve with use of OTC tums or other acid reducers. She denies chest pressure or shortness of breath, left arm pain. Or jaw pain. She has no other concerns or complaints today. Blood pressure well managed.       Current Medication: Outpatient Encounter Medications as of 09/17/2019  Medication Sig  . ASPIRIN LOW DOSE 81 MG EC tablet TAKE 1 TABLET DAILY  . atorvastatin (LIPITOR) 40 MG tablet Take 1 tablet (40 mg total) by mouth daily.  . chlorthalidone (HYGROTON) 25 MG tablet TAKE ONE TABLET BY MOUTH DAILY *INCREASE POTASSIUM INTAKE WITH DIET*  . dapagliflozin propanediol (FARXIGA) 10 MG TABS tablet Take 10 mg by mouth daily.  Marland Kitchen EPIPEN 2-PAK 0.3 MG/0.3ML SOAJ injection AS DIRECTED AS DIRECTED INJECTION 2  . glucose blood (ACCU-CHEK GUIDE) test strip Use as instructed once daily. Dx e11.65  . lansoprazole (PREVACID) 30 MG capsule Take 1 capsule (30 mg total) by mouth daily.  Marland Kitchen levothyroxine (SYNTHROID) 50 MCG tablet TAKE 1 TABLET DAILY AND 2 TABLETS ON SUNDAYS  . metoprolol succinate (TOPROL-XL) 50 MG 24 hr tablet Take 1 tablet (50 mg total) by mouth daily.  . [DISCONTINUED] chlorthalidone (HYGROTON) 25 MG tablet TAKE ONE TABLET BY MOUTH DAILY *INCREASE POTASSIUM INTAKE WITH DIET*  . [DISCONTINUED] dapagliflozin propanediol (FARXIGA) 10 MG TABS tablet Take 10 mg by mouth daily.  . [DISCONTINUED] lubiprostone (AMITIZA) 8 MCG capsule Take 1 capsule (8  mcg total) by mouth 2 (two) times daily with a meal. (Patient not taking: Reported on 09/17/2019)   No facility-administered encounter medications on file as of 09/17/2019.    Surgical History: Past Surgical History:  Procedure Laterality Date  . CARPAL TUNNEL RELEASE Right 2010   Dr. Sabra Heck  . CATARACT EXTRACTION W/PHACO Left 11/02/2015   Procedure: CATARACT EXTRACTION PHACO AND INTRAOCULAR LENS PLACEMENT ([place]) left eye;  Surgeon: Leandrew Koyanagi, MD;  Location: [facility];  Service: Ophthalmology;  Laterality: Left;  . CHONDROPLASTY Left 11/14/2015   Procedure: CHONDROPLASTY;  Surgeon: Dereck Leep, MD;  Location: [facility];  Service: Orthopedics;  Laterality: Left;  . KNEE ARTHROSCOPY WITH LATERAL MENISECTOMY Left 11/14/2015   Procedure: KNEE ARTHROSCOPY WITH LATERAL MENISECTOMY;  Surgeon: Dereck Leep, MD;  Location: [facility];  Service: Orthopedics;  Laterality: Left;  . KNEE ARTHROSCOPY WITH MEDIAL MENISECTOMY Left 11/14/2015   Procedure: KNEE ARTHROSCOPY WITH MEDIAL MENISECTOMY;  Surgeon: Dereck Leep, MD;  Location: [facility];  Service: Orthopedics;  Laterality: Left;  . TONSILLECTOMY    . TUBAL LIGATION      Medical History: Past Medical History:  Diagnosis Date  . Allergy   . Arthritis    in knee  . Chronic constipation   . Chronic insomnia   . GERD (gastroesophageal reflux disease)   . Hemorrhoids   . Hypertension   . Hypothyroidism, adult   . Impingement syndrome of right shoulder    [facility] subacromial bursitis and tendinitis right  shoulder  . Lumbar spinal stenosis    Dr. Phyllis Ginger  . Lump or mass in breast    Dr. Jamal Collin evaluated, likely lipoma  . Menopause   . Metabolic syndrome   . Mild carpal tunnel syndrome of right wrist   . Obesity (BMI 35.0-39.9 without comorbidity)   . Proteinuria     Family History: Family History  Problem Relation Age of Onset  . Cancer Mother        Breast  . Diabetes Mother   . Breast cancer Mother   .  Hypertension Father   . CVA Father        22's  . Hypertension Sister   . Hypertension Brother   . Breast cancer Paternal Aunt     Social History   Socioeconomic History  . Marital status: Married    Spouse name: Not on file  . Number of children: Not on file  . Years of education: Not on file  . Highest education level: Not on file  Occupational History  . Not on file  Tobacco Use  . Smoking status: Never Smoker  . Smokeless tobacco: Never Used  Substance and Sexual Activity  . Alcohol use: No    Alcohol/week: 0.0 standard drinks  . Drug use: No  . Sexual activity: Yes    Partners: Male  Other Topics Concern  . Not on file  Social History Narrative  . Not on file   Social Determinants of Health   Financial Resource Strain:   . Difficulty of Paying Living Expenses:   Food Insecurity:   . Worried About Charity fundraiser in the Last Year:   . Arboriculturist in the Last Year:   Transportation Needs:   . Film/video editor (Medical):   Marland Kitchen Lack of Transportation (Non-Medical):   Physical Activity:   . Days of Exercise per Week:   . Minutes of Exercise per Session:   Stress:   . Feeling of Stress :   Social Connections:   . Frequency of Communication with Friends and Family:   . Frequency of Social Gatherings with Friends and Family:   . Attends Religious Services:   . Active Member of Clubs or Organizations:   . Attends Archivist Meetings:   Marland Kitchen Marital Status:   Intimate Partner Violence:   . Fear of Current or Ex-Partner:   . Emotionally Abused:   Marland Kitchen Physically Abused:   . Sexually Abused:       Review of Systems  Constitutional: Negative for activity change, chills, diaphoresis and fatigue.  HENT: Negative for ear pain, postnasal drip and sinus pressure.   Respiratory: Negative for cough, shortness of breath and wheezing.   Cardiovascular: Negative for chest pain and palpitations.  Gastrointestinal: Positive for constipation. Negative  for abdominal pain, diarrhea, nausea and vomiting.  Endocrine: Negative for cold intolerance, heat intolerance, polydipsia and polyuria.       Improved blood sugars since starting Farxiga 10mg  every day.   Musculoskeletal: Negative for arthralgias, back pain, gait problem and neck pain.  Skin: Negative for color change.  Allergic/Immunologic: Negative for environmental allergies and food allergies.  Neurological: Negative for dizziness and headaches.  Hematological: Does not bruise/bleed easily.  Psychiatric/Behavioral: Negative for agitation, behavioral problems (depression) and hallucinations.    Today's Vitals   09/17/19 1433  BP: 139/67  Pulse: 75  Resp: 16  Temp: (!) 97.4 F (36.3 C)  SpO2: 96%  Weight: 196 lb 6.4 oz (89.1 kg)  Height: 5\' 6"  (1.676 m)   Body mass index is 31.7 kg/m.  Physical Exam Vitals and nursing note reviewed.  Constitutional:      General: She is not in acute distress.    Appearance: Normal appearance. She is well-developed. She is not diaphoretic.  HENT:     Head: Normocephalic and atraumatic.     Nose: Nose normal.     Mouth/Throat:     Pharynx: No oropharyngeal exudate.  Eyes:     Pupils: Pupils are equal, round, and reactive to light.  Neck:     Thyroid: No thyromegaly.     Vascular: No carotid bruit or JVD.     Trachea: No tracheal deviation.  Cardiovascular:     Rate and Rhythm: Normal rate and regular rhythm.     Heart sounds: Normal heart sounds. No murmur. No friction rub. No gallop.   Pulmonary:     Effort: Pulmonary effort is normal. No respiratory distress.     Breath sounds: Normal breath sounds. No wheezing or rales.  Chest:     Chest wall: No tenderness.  Abdominal:     Palpations: Abdomen is soft.  Musculoskeletal:        General: Normal range of motion.     Cervical back: Normal range of motion and neck supple.  Lymphadenopathy:     Cervical: No cervical adenopathy.  Skin:    General: Skin is warm and dry.   Neurological:     Mental Status: She is alert and oriented to person, place, and time.     Cranial Nerves: No cranial nerve deficit.  Psychiatric:        Mood and Affect: Mood normal.        Behavior: Behavior normal.        Thought Content: Thought content normal.        Judgment: Judgment normal.   Assessment/Plan: 1. Type 2 diabetes mellitus with hyperglycemia, unspecified whether long term insulin use (HCC) - POCT HgB A1C 7.1 today. Continue farxiga 10mg  daily. Monitor blood sugars closely.  - dapagliflozin propanediol (FARXIGA) 10 MG TABS tablet; Take 10 mg by mouth daily.  Dispense: 90 tablet; Refill: 3  2. Benign hypertension Stable. Continue bp medication as prescribed  - chlorthalidone (HYGROTON) 25 MG tablet; TAKE ONE TABLET BY MOUTH DAILY *INCREASE POTASSIUM INTAKE WITH DIET*  Dispense: 90 tablet; Refill: 3  4. Chronic constipation Use linzess as prescribed. Samples provided today.  General Counseling: maxine stellwagen understanding of the findings of todays visit and agrees with plan of treatment. I have discussed any further diagnostic evaluation that may be needed or ordered today. We also reviewed her medications today. she has been encouraged to call the office with any questions or concerns that should arise related to todays visit.  Diabetes Counseling:  1. Addition of ACE inh/ ARB'S for nephroprotection. Microalbumin is updated  2. Diabetic foot care, prevention of complications. Podiatry consult 3. Exercise and lose weight.  4. Diabetic eye examination, Diabetic eye exam is updated  5. Monitor blood sugar closlely. nutrition counseling.  6. Sign and symptoms of hypoglycemia including shaking sweating,confusion and headaches.  This patient was seen by Leretha Pol FNP Collaboration with Dr Lavera Guise as a part of collaborative care agreement  Orders Placed This Encounter  Procedures  . POCT HgB A1C    Meds ordered this encounter  Medications  .  dapagliflozin propanediol (FARXIGA) 10 MG TABS tablet    Sig: Take 10 mg by mouth daily.  Dispense:  90 tablet    Refill:  3    Please fill as 90 day prescription with next fill. Thanks.    Order Specific Question:   Supervising Provider    Answer:   Lavera Guise [id number]  . chlorthalidone (HYGROTON) 25 MG tablet    Sig: TAKE ONE TABLET BY MOUTH DAILY *INCREASE POTASSIUM INTAKE WITH DIET*    Dispense:  90 tablet    Refill:  3    Order Specific Question:   Supervising Provider    Answer:   Lavera Guise [id number]    Total time spent: 30 Minutes Time spent includes review of chart, medications, test results, and follow up plan with the patient.      Dr Lavera Guise Internal medicine

## 2019-11-17 ENCOUNTER — Other Ambulatory Visit: Payer: Self-pay

## 2019-11-17 DIAGNOSIS — I10 Essential (primary) hypertension: Secondary | ICD-10-CM

## 2019-11-17 MED ORDER — CHLORTHALIDONE 25 MG PO TABS: ORAL_TABLET | ORAL | 3 refills | Status: DC

## 2019-12-15 ENCOUNTER — Telehealth: Payer: Self-pay

## 2019-12-15 NOTE — Telephone Encounter (Signed)
Confirmed and screened for 12-18-19 ov. 

## 2019-12-18 ENCOUNTER — Ambulatory Visit: Payer: BLUE CROSS/BLUE SHIELD | Admitting: Hospice and Palliative Medicine

## 2019-12-18 ENCOUNTER — Other Ambulatory Visit: Payer: Self-pay

## 2019-12-18 ENCOUNTER — Encounter: Payer: Self-pay | Admitting: Hospice and Palliative Medicine

## 2019-12-18 DIAGNOSIS — E038 Other specified hypothyroidism: Secondary | ICD-10-CM | POA: Diagnosis not present

## 2019-12-18 DIAGNOSIS — K219 Gastro-esophageal reflux disease without esophagitis: Secondary | ICD-10-CM

## 2019-12-18 DIAGNOSIS — E1165 Type 2 diabetes mellitus with hyperglycemia: Secondary | ICD-10-CM

## 2019-12-18 DIAGNOSIS — I10 Essential (primary) hypertension: Secondary | ICD-10-CM

## 2019-12-18 DIAGNOSIS — G5601 Carpal tunnel syndrome, right upper limb: Secondary | ICD-10-CM | POA: Diagnosis not present

## 2019-12-18 DIAGNOSIS — Z0001 Encounter for general adult medical examination with abnormal findings: Secondary | ICD-10-CM

## 2019-12-18 DIAGNOSIS — Z7689 Persons encountering health services in other specified circumstances: Secondary | ICD-10-CM

## 2019-12-18 LAB — POCT GLYCOSYLATED HEMOGLOBIN (HGB A1C): Hemoglobin A1C: 6.4 % — AB (ref 4.0–5.6)

## 2019-12-18 MED ORDER — LANSOPRAZOLE 30 MG PO CPDR: 30.0000 mg | DELAYED_RELEASE_CAPSULE | Freq: Every day | ORAL | 1 refills | Status: DC

## 2019-12-18 MED ORDER — LEVOTHYROXINE SODIUM 50 MCG PO TABS: ORAL_TABLET | ORAL | 1 refills | Status: DC

## 2019-12-18 MED ORDER — METOPROLOL SUCCINATE ER 50 MG PO TB24: 50.0000 mg | ORAL_TABLET | Freq: Every day | ORAL | 1 refills | Status: DC

## 2019-12-18 NOTE — Progress Notes (Signed)
[facility] [place], [place] [address]  Internal MEDICINE  Office Visit Note  Patient Name: Judy Graves  [id number]  [id number]  Date of Service: 12/19/2019  Chief Complaint  Patient presents with  . Follow-up  . Hypertension  . Quality Metric Gaps    Pap, TDAP     HPI Patient is here today for routine follow up on chronic medical conditions. Today her A1C is 6.4. Much improved A1C from last check which was 7.0. Currently on Farxiga 10mg  once a day, reports no side effects from this medication. BP stable today, currently taking chlorthalidone as well as metoprolol Needs physical scheduled for 12/21 will order labs for this today. Struggles with chronic constipation, may have a bowel movement once every 2-3 days. When she does have a BM she reports she does have to strain which causes discomfort. She has been given Linzess samples in the past to help with constipation. Linzess does allow her to have a BM that day but she does not take them on a routine basis due to causing some diarrhea. Has had carpal tunnel surgery several years ago, she reports that over the last month or so she has noticed swelling and soreness in her right thumb and wrist.  Current Medication: Outpatient Encounter Medications as of 12/18/2019  Medication Sig  . ASPIRIN LOW DOSE 81 MG EC tablet TAKE 1 TABLET DAILY  . atorvastatin (LIPITOR) 40 MG tablet Take 1 tablet (40 mg total) by mouth daily.  . chlorthalidone (HYGROTON) 25 MG tablet TAKE ONE TABLET BY MOUTH DAILY *INCREASE POTASSIUM INTAKE WITH DIET*  . dapagliflozin propanediol (FARXIGA) 10 MG TABS tablet Take 10 mg by mouth daily.  Marland Kitchen EPIPEN 2-PAK 0.3 MG/0.3ML SOAJ injection AS DIRECTED AS DIRECTED INJECTION 2  . glucose blood (ACCU-CHEK GUIDE) test strip Use as instructed once daily. Dx e11.65  . lansoprazole (PREVACID) 30 MG capsule Take 1 capsule (30 mg total) by mouth daily.  Marland Kitchen levothyroxine (SYNTHROID) 50 MCG tablet TAKE 1  TABLET DAILY AND 2 TABLETS ON SUNDAYS  . metoprolol succinate (TOPROL-XL) 50 MG 24 hr tablet Take 1 tablet (50 mg total) by mouth daily.  . [DISCONTINUED] lansoprazole (PREVACID) 30 MG capsule Take 1 capsule (30 mg total) by mouth daily.  . [DISCONTINUED] levothyroxine (SYNTHROID) 50 MCG tablet TAKE 1 TABLET DAILY AND 2 TABLETS ON SUNDAYS  . [DISCONTINUED] metoprolol succinate (TOPROL-XL) 50 MG 24 hr tablet Take 1 tablet (50 mg total) by mouth daily.   No facility-administered encounter medications on file as of 12/18/2019.    Surgical History: Past Surgical History:  Procedure Laterality Date  . CARPAL TUNNEL RELEASE Right 2010   Dr. Sabra Heck  . CATARACT EXTRACTION W/PHACO Left 11/02/2015   Procedure: CATARACT EXTRACTION PHACO AND INTRAOCULAR LENS PLACEMENT ([place]) left eye;  Surgeon: Leandrew Koyanagi, MD;  Location: [place];  Service: Ophthalmology;  Laterality: Left;  . CHONDROPLASTY Left 11/14/2015   Procedure: CHONDROPLASTY;  Surgeon: Dereck Leep, MD;  Location: [facility];  Service: Orthopedics;  Laterality: Left;  . KNEE ARTHROSCOPY WITH LATERAL MENISECTOMY Left 11/14/2015   Procedure: KNEE ARTHROSCOPY WITH LATERAL MENISECTOMY;  Surgeon: Dereck Leep, MD;  Location: [facility];  Service: Orthopedics;  Laterality: Left;  . KNEE ARTHROSCOPY WITH MEDIAL MENISECTOMY Left 11/14/2015   Procedure: KNEE ARTHROSCOPY WITH MEDIAL MENISECTOMY;  Surgeon: Dereck Leep, MD;  Location: [facility];  Service: Orthopedics;  Laterality: Left;  . TONSILLECTOMY    . TUBAL LIGATION  Medical History: Past Medical History:  Diagnosis Date  . Allergy   . Arthritis    in knee  . Chronic constipation   . Chronic insomnia   . GERD (gastroesophageal reflux disease)   . Hemorrhoids   . Hypertension   . Hypothyroidism, adult   . Impingement syndrome of right shoulder    [facility] subacromial bursitis and tendinitis right shoulder  . Lumbar spinal stenosis    Dr. Phyllis Ginger  . Lump or  mass in breast    Dr. Jamal Collin evaluated, likely lipoma  . Menopause   . Metabolic syndrome   . Mild carpal tunnel syndrome of right wrist   . Obesity (BMI 35.0-39.9 without comorbidity)   . Proteinuria     Family History: Family History  Problem Relation Age of Onset  . Cancer Mother        Breast  . Diabetes Mother   . Breast cancer Mother   . Hypertension Father   . CVA Father        43's  . Hypertension Sister   . Hypertension Brother   . Breast cancer Paternal Aunt     Social History   Socioeconomic History  . Marital status: Married    Spouse name: Not on file  . Number of children: Not on file  . Years of education: Not on file  . Highest education level: Not on file  Occupational History  . Not on file  Tobacco Use  . Smoking status: Never Smoker  . Smokeless tobacco: Never Used  Substance and Sexual Activity  . Alcohol use: No    Alcohol/week: 0.0 standard drinks  . Drug use: No  . Sexual activity: Yes    Partners: Male  Other Topics Concern  . Not on file  Social History Narrative  . Not on file   Social Determinants of Health   Financial Resource Strain:   . Difficulty of Paying Living Expenses:   Food Insecurity:   . Worried About Charity fundraiser in the Last Year:   . Arboriculturist in the Last Year:   Transportation Needs:   . Film/video editor (Medical):   Marland Kitchen Lack of Transportation (Non-Medical):   Physical Activity:   . Days of Exercise per Week:   . Minutes of Exercise per Session:   Stress:   . Feeling of Stress :   Social Connections:   . Frequency of Communication with Friends and Family:   . Frequency of Social Gatherings with Friends and Family:   . Attends Religious Services:   . Active Member of Clubs or Organizations:   . Attends Archivist Meetings:   Marland Kitchen Marital Status:   Intimate Partner Violence:   . Fear of Current or Ex-Partner:   . Emotionally Abused:   Marland Kitchen Physically Abused:   . Sexually Abused:      Review of Systems  Constitutional: Negative for diaphoresis, fatigue and unexpected weight change.  HENT: Negative for postnasal drip, sinus pressure and sore throat.   Eyes: Negative for photophobia and visual disturbance.  Respiratory: Negative for cough, shortness of breath and wheezing.   Cardiovascular: Negative for chest pain, palpitations and leg swelling.  Gastrointestinal: Negative for abdominal pain, constipation, diarrhea, nausea and vomiting.  Genitourinary: Negative for dysuria, flank pain and frequency.  Musculoskeletal: Negative for back pain, gait problem, myalgias and neck pain.       Right thumb and wrist tenderness and swelling  Skin: Negative for color change.  Allergic/Immunologic: Negative for environmental allergies and food allergies.  Neurological: Negative for dizziness, weakness and headaches.  Hematological: Does not bruise/bleed easily.  Psychiatric/Behavioral: Negative for agitation, behavioral problems (depression) and hallucinations.    Vital Signs: BP 136/70   Pulse 77   Temp (!) 97.1 F (36.2 C)   Resp 16   Ht 5\' 6"  (1.676 m)   Wt 191 lb (86.6 kg)   SpO2 96%   BMI 30.83 kg/m    Physical Exam Constitutional:      Appearance: Normal appearance.  HENT:     Nose: Nose normal.     Mouth/Throat:     Mouth: Mucous membranes are moist.     Pharynx: Oropharynx is clear.  Cardiovascular:     Rate and Rhythm: Normal rate and regular rhythm.     Pulses: Normal pulses.     Heart sounds: Normal heart sounds.  Pulmonary:     Effort: Pulmonary effort is normal.     Breath sounds: Normal breath sounds.  Abdominal:     General: Abdomen is flat. Bowel sounds are normal.     Palpations: Abdomen is soft.  Musculoskeletal:     Right wrist: Swelling and bony tenderness present.     Right hand: Swelling and tenderness present.     Cervical back: Normal range of motion.     Comments: Right wrist swelling and tenderness Right thumb swelling and  tenderness  Skin:    General: Skin is warm.  Neurological:     General: No focal deficit present.     Mental Status: She is alert and oriented to person, place, and time. Mental status is at baseline.  Psychiatric:        Mood and Affect: Mood normal.        Behavior: Behavior normal.        Thought Content: Thought content normal.    Assessment/Plan: 1. Type 2 diabetes mellitus with hyperglycemia, unspecified whether long term insulin use (HCC) A1C today is 6.4, continues to show improvement on Farxiga. At this time she is not reporting any negative side effects. Continue with the current regimen and will continue to monitor A1C levels every 3 months. Gave a sample of each type of glucometer, she has been having difficulties with these machines breaking. - POCT HgB A1C  2. Carpal tunnel syndrome of right wrist Has complaints that over the last month she has noticed tenderness and swelling to her right thumb and wrist. Has had carpal tunnel surgery many years ago. Encouraged her to wear a compression sleeve at night while sleeping. Will assess at next visit to see if sleeve has helped alleviate discomfort.  3. Other specified hypothyroidism Requests refills today. Labs ordered for annual physical exam and will review need to adjust medication at that time. - levothyroxine (SYNTHROID) 50 MCG tablet; TAKE 1 TABLET DAILY AND 2 TABLETS ON SUNDAYS  Dispense: 102 tablet; Refill: 1  4. Benign hypertension BP stable today, requests refills today. Continue with current therapy of metoprolol as well as chlorthalidone and continue to monitor. - metoprolol succinate (TOPROL-XL) 50 MG 24 hr tablet; Take 1 tablet (50 mg total) by mouth daily.  Dispense: 90 tablet; Refill: 1  5. Gastroesophageal reflux disease without esophagitis Stable at this time, requesting refills today. Will continue to monitor. - lansoprazole (PREVACID) 30 MG capsule; Take 1 capsule (30 mg total) by mouth daily.  Dispense: 90  capsule; Refill: 1    General Counseling: Lynzie verbalizes understanding of the findings of  todays visit and agrees with plan of treatment. I have discussed any further diagnostic evaluation that may be needed or ordered today. We also reviewed her medications today. she has been encouraged to call the office with any questions or concerns that should arise related to todays visit.    Orders Placed This Encounter  Procedures  . CBC with Differential  . COMPLETE METABOLIC PANEL WITH GFR  . TSH + free T4  . Lipid Panel With LDL/HDL Ratio  . Vitamin D 1,25 dihydroxy  . B12  . POCT HgB A1C    Meds ordered this encounter  Medications  . lansoprazole (PREVACID) 30 MG capsule    Sig: Take 1 capsule (30 mg total) by mouth daily.    Dispense:  90 capsule    Refill:  1  . levothyroxine (SYNTHROID) 50 MCG tablet    Sig: TAKE 1 TABLET DAILY AND 2 TABLETS ON SUNDAYS    Dispense:  102 tablet    Refill:  1  . metoprolol succinate (TOPROL-XL) 50 MG 24 hr tablet    Sig: Take 1 tablet (50 mg total) by mouth daily.    Dispense:  90 tablet    Refill:  1    Time spent: 25 Minutes   This patient was seen by Theodoro Grist AGNP-C in Collaboration with Dr Lavera Guise as a part of collaborative care agreement     Tanna Furry. Shalanda Brogden AGNP-C Internal medicine

## 2020-01-27 ENCOUNTER — Other Ambulatory Visit: Payer: Self-pay

## 2020-01-27 ENCOUNTER — Ambulatory Visit (INDEPENDENT_AMBULATORY_CARE_PROVIDER_SITE_OTHER): Payer: BLUE CROSS/BLUE SHIELD

## 2020-01-27 DIAGNOSIS — Z23 Encounter for immunization: Secondary | ICD-10-CM

## 2020-03-02 ENCOUNTER — Other Ambulatory Visit: Payer: Self-pay

## 2020-03-02 DIAGNOSIS — E038 Other specified hypothyroidism: Secondary | ICD-10-CM

## 2020-03-02 MED ORDER — LEVOTHYROXINE SODIUM 50 MCG PO TABS: ORAL_TABLET | ORAL | 1 refills | Status: DC

## 2020-03-03 ENCOUNTER — Other Ambulatory Visit: Payer: Self-pay

## 2020-03-03 DIAGNOSIS — E038 Other specified hypothyroidism: Secondary | ICD-10-CM

## 2020-03-03 MED ORDER — LEVOTHYROXINE SODIUM 50 MCG PO TABS: ORAL_TABLET | ORAL | 1 refills | Status: DC

## 2020-03-16 NOTE — Progress Notes (Signed)
Will discuss lab results with her at next follow-up visit

## 2020-03-17 ENCOUNTER — Encounter: Payer: Self-pay | Admitting: Hospice and Palliative Medicine

## 2020-03-17 ENCOUNTER — Ambulatory Visit: Payer: BLUE CROSS/BLUE SHIELD | Admitting: Hospice and Palliative Medicine

## 2020-03-17 ENCOUNTER — Other Ambulatory Visit: Payer: Self-pay

## 2020-03-17 DIAGNOSIS — E1165 Type 2 diabetes mellitus with hyperglycemia: Secondary | ICD-10-CM | POA: Diagnosis not present

## 2020-03-17 DIAGNOSIS — K5909 Other constipation: Secondary | ICD-10-CM | POA: Diagnosis not present

## 2020-03-17 DIAGNOSIS — I10 Essential (primary) hypertension: Secondary | ICD-10-CM | POA: Diagnosis not present

## 2020-03-17 DIAGNOSIS — G5601 Carpal tunnel syndrome, right upper limb: Secondary | ICD-10-CM

## 2020-03-17 LAB — POCT GLYCOSYLATED HEMOGLOBIN (HGB A1C): Hemoglobin A1C: 6.7 % — AB (ref 4.0–5.6)

## 2020-03-17 NOTE — Progress Notes (Signed)
[facility] [place], [place] [address]  Internal MEDICINE  Office Visit Note  Patient Name: Judy Graves  [id number]  [id number]  Date of Service: 03/22/2020  Chief Complaint  Patient presents with  . Follow-up  . Hypertension  . policy update form    received    HPI Patient is here for routine follow-up -DM-reports no complications since our last visit, randomly checks her glucose levels at home and they have been controlled -Has been using Linzess for constipation and says this controls her constipation and BM's are now regular -Carpal tunnel-pain and swelling have improved since last visit -Reviewed her recent labs with her--all stable   No acute complaints today--feeling well   Current Medication: Outpatient Encounter Medications as of 03/17/2020  Medication Sig  . ASPIRIN LOW DOSE 81 MG EC tablet TAKE 1 TABLET DAILY  . atorvastatin (LIPITOR) 40 MG tablet Take 1 tablet (40 mg total) by mouth daily.  . chlorthalidone (HYGROTON) 25 MG tablet TAKE ONE TABLET BY MOUTH DAILY *INCREASE POTASSIUM INTAKE WITH DIET*  . dapagliflozin propanediol (FARXIGA) 10 MG TABS tablet Take 10 mg by mouth daily.  Marland Kitchen EPIPEN 2-PAK 0.3 MG/0.3ML SOAJ injection AS DIRECTED AS DIRECTED INJECTION 2  . glucose blood (ACCU-CHEK GUIDE) test strip Use as instructed once daily. Dx e11.65  . lansoprazole (PREVACID) 30 MG capsule Take 1 capsule (30 mg total) by mouth daily.  Marland Kitchen levothyroxine (SYNTHROID) 50 MCG tablet TAKE 1 TABLET DAILY AND 2 TABLETS ON SUNDAYS  . metoprolol succinate (TOPROL-XL) 50 MG 24 hr tablet Take 1 tablet (50 mg total) by mouth daily.   No facility-administered encounter medications on file as of 03/17/2020.    Surgical History: Past Surgical History:  Procedure Laterality Date  . CARPAL TUNNEL RELEASE Right 2010   Dr. Sabra Heck  . CATARACT EXTRACTION W/PHACO Left 11/02/2015   Procedure: CATARACT EXTRACTION PHACO AND INTRAOCULAR LENS PLACEMENT ([place]) left  eye;  Surgeon: Leandrew Koyanagi, MD;  Location: [place];  Service: Ophthalmology;  Laterality: Left;  . CHONDROPLASTY Left 11/14/2015   Procedure: CHONDROPLASTY;  Surgeon: Dereck Leep, MD;  Location: [facility];  Service: Orthopedics;  Laterality: Left;  . KNEE ARTHROSCOPY WITH LATERAL MENISECTOMY Left 11/14/2015   Procedure: KNEE ARTHROSCOPY WITH LATERAL MENISECTOMY;  Surgeon: Dereck Leep, MD;  Location: [facility];  Service: Orthopedics;  Laterality: Left;  . KNEE ARTHROSCOPY WITH MEDIAL MENISECTOMY Left 11/14/2015   Procedure: KNEE ARTHROSCOPY WITH MEDIAL MENISECTOMY;  Surgeon: Dereck Leep, MD;  Location: [facility];  Service: Orthopedics;  Laterality: Left;  . TONSILLECTOMY    . TUBAL LIGATION      Medical History: Past Medical History:  Diagnosis Date  . Allergy   . Arthritis    in knee  . Chronic constipation   . Chronic insomnia   . GERD (gastroesophageal reflux disease)   . Hemorrhoids   . Hypertension   . Hypothyroidism, adult   . Impingement syndrome of right shoulder    [facility] subacromial bursitis and tendinitis right shoulder  . Lumbar spinal stenosis    Dr. Phyllis Ginger  . Lump or mass in breast    Dr. Jamal Collin evaluated, likely lipoma  . Menopause   . Metabolic syndrome   . Mild carpal tunnel syndrome of right wrist   . Obesity (BMI 35.0-39.9 without comorbidity)   . Proteinuria     Family History: Family History  Problem Relation Age of Onset  . Cancer Mother  Breast  . Diabetes Mother   . Breast cancer Mother   . Hypertension Father   . CVA Father        50's  . Hypertension Sister   . Hypertension Brother   . Breast cancer Paternal Aunt     Social History   Socioeconomic History  . Marital status: Married    Spouse name: Not on file  . Number of children: Not on file  . Years of education: Not on file  . Highest education level: Not on file  Occupational History  . Not on file  Tobacco Use  . Smoking status: Never  Smoker  . Smokeless tobacco: Never Used  Substance and Sexual Activity  . Alcohol use: No    Alcohol/week: 0.0 standard drinks  . Drug use: No  . Sexual activity: Yes    Partners: Male  Other Topics Concern  . Not on file  Social History Narrative  . Not on file   Social Determinants of Health   Financial Resource Strain:   . Difficulty of Paying Living Expenses: Not on file  Food Insecurity:   . Worried About Charity fundraiser in the Last Year: Not on file  . Ran Out of Food in the Last Year: Not on file  Transportation Needs:   . Lack of Transportation (Medical): Not on file  . Lack of Transportation (Non-Medical): Not on file  Physical Activity:   . Days of Exercise per Week: Not on file  . Minutes of Exercise per Session: Not on file  Stress:   . Feeling of Stress : Not on file  Social Connections:   . Frequency of Communication with Friends and Family: Not on file  . Frequency of Social Gatherings with Friends and Family: Not on file  . Attends Religious Services: Not on file  . Active Member of Clubs or Organizations: Not on file  . Attends Archivist Meetings: Not on file  . Marital Status: Not on file  Intimate Partner Violence:   . Fear of Current or Ex-Partner: Not on file  . Emotionally Abused: Not on file  . Physically Abused: Not on file  . Sexually Abused: Not on file      Review of Systems  Constitutional: Negative for chills, diaphoresis and fatigue.  HENT: Negative for ear pain, postnasal drip and sinus pressure.   Eyes: Negative for photophobia, discharge, redness, itching and visual disturbance.  Respiratory: Negative for cough, shortness of breath and wheezing.   Cardiovascular: Negative for chest pain, palpitations and leg swelling.  Gastrointestinal: Negative for abdominal pain, constipation, diarrhea, nausea and vomiting.  Genitourinary: Negative for dysuria and flank pain.  Musculoskeletal: Negative for arthralgias, back pain,  gait problem and neck pain.  Skin: Negative for color change.  Allergic/Immunologic: Negative for environmental allergies and food allergies.  Neurological: Negative for dizziness and headaches.  Hematological: Does not bruise/bleed easily.  Psychiatric/Behavioral: Negative for agitation, behavioral problems (depression) and hallucinations.    Vital Signs: BP 140/76   Pulse 70   Temp 97.7 F (36.5 C)   Resp 16   Ht 5\' 6"  (1.676 m)   Wt 191 lb 3.2 oz (86.7 kg)   SpO2 97%   BMI 30.86 kg/m    Physical Exam Vitals reviewed.  Constitutional:      Appearance: Normal appearance.  Cardiovascular:     Rate and Rhythm: Normal rate and regular rhythm.     Pulses: Normal pulses.     Heart sounds:  Normal heart sounds.  Pulmonary:     Effort: Pulmonary effort is normal.     Breath sounds: Normal breath sounds.  Musculoskeletal:        General: Normal range of motion.     Cervical back: Normal range of motion.  Skin:    General: Skin is warm.  Neurological:     General: No focal deficit present.     Mental Status: She is alert and oriented to person, place, and time. Mental status is at baseline.  Psychiatric:        Mood and Affect: Mood normal.        Behavior: Behavior normal.        Thought Content: Thought content normal.    Assessment/Plan: 1. Type 2 diabetes mellitus with hyperglycemia, unspecified whether long term insulin use (HCC) A1C 6.7 today, well controlled, continue with Farxiga 10 mg--will continue with routine montioring - POCT HgB A1C  2. Benign hypertension BP and HR well controlled today--would like tighter control of SBP--will keep monitoring and make adjustments as needed  3. Carpal tunnel syndrome of right wrist Symptoms have improved since last visit--continue monitoring May benefit from ortho referral if pain worsens or persists  4. Chronic constipation Samples of Linzess given today to use for constipation--at this time she is unable to afford  prescription Takes as needed  General Counseling: Starr verbalizes understanding of the findings of todays visit and agrees with plan of treatment. I have discussed any further diagnostic evaluation that may be needed or ordered today. We also reviewed her medications today. she has been encouraged to call the office with any questions or concerns that should arise related to todays visit.    Orders Placed This Encounter  Procedures  . POCT HgB A1C      Time spent:30 Minutes Time spent includes review of chart, medications, test results and follow-up plan with the patient.  This patient was seen by Theodoro Grist AGNP-C in Collaboration with Dr Lavera Guise as a part of collaborative care agreement     Tanna Furry. Nicolette Gieske AGNP-C Internal medicine

## 2020-03-20 LAB — LIPID PANEL WITH LDL/HDL RATIO
Cholesterol, Total: 173 mg/dL (ref 100–199)
HDL: 48 mg/dL (ref >39)
LDL Chol Calc (NIH): 105 mg/dL — ABNORMAL HIGH (ref 0–99)
LDL/HDL Ratio: 2.2 ratio (ref 0.0–3.2)
Triglycerides: 111 mg/dL (ref 0–149)
VLDL Cholesterol Cal: 20 mg/dL (ref 5–40)

## 2020-03-20 LAB — CBC WITH DIFFERENTIAL/PLATELET
Basophils Absolute: 0 10*3/uL (ref 0.0–0.2)
Basos: 1 %
EOS (ABSOLUTE): 0 10*3/uL (ref 0.0–0.4)
Eos: 1 %
Hematocrit: 46.4 % (ref 34.0–46.6)
Hemoglobin: 16.3 g/dL — ABNORMAL HIGH (ref 11.1–15.9)
Immature Grans (Abs): 0 10*3/uL (ref 0.0–0.1)
Immature Granulocytes: 0 %
Lymphocytes Absolute: 3.3 10*3/uL — ABNORMAL HIGH (ref 0.7–3.1)
Lymphs: 42 %
MCH: 32 pg (ref 26.6–33.0)
MCHC: 35.1 g/dL (ref 31.5–35.7)
MCV: 91 fL (ref 79–97)
Monocytes Absolute: 0.8 10*3/uL (ref 0.1–0.9)
Monocytes: 10 %
Neutrophils Absolute: 3.8 10*3/uL (ref 1.4–7.0)
Neutrophils: 46 %
Platelets: 207 10*3/uL (ref 150–450)
RBC: 5.09 x10E6/uL (ref 3.77–5.28)
RDW: 12.7 % (ref 11.7–15.4)
WBC: 8 10*3/uL (ref 3.4–10.8)

## 2020-03-20 LAB — TSH+FREE T4
Free T4: 1.52 ng/dL (ref 0.82–1.77)
TSH: 1.61 u[IU]/mL (ref 0.450–4.500)

## 2020-03-20 LAB — VITAMIN D 1,25 DIHYDROXY
Vitamin D 1, 25 (OH)2 Total: 28 pg/mL
Vitamin D2 1, 25 (OH)2: <10 pg/mL
Vitamin D3 1, 25 (OH)2: 28 pg/mL

## 2020-03-20 LAB — VITAMIN B12: Vitamin B-12: 372 pg/mL (ref 232–1245)

## 2020-03-22 ENCOUNTER — Encounter: Payer: Self-pay | Admitting: Hospice and Palliative Medicine

## 2020-04-28 ENCOUNTER — Other Ambulatory Visit: Payer: Self-pay

## 2020-04-28 ENCOUNTER — Ambulatory Visit (INDEPENDENT_AMBULATORY_CARE_PROVIDER_SITE_OTHER): Payer: BLUE CROSS/BLUE SHIELD | Admitting: Nurse Practitioner

## 2020-04-28 VITALS — BP 128/88 | HR 84 | Temp 97.5°F | Resp 16 | Ht 66.0 in | Wt 190.6 lb

## 2020-04-28 DIAGNOSIS — I10 Essential (primary) hypertension: Secondary | ICD-10-CM

## 2020-04-28 DIAGNOSIS — Z1231 Encounter for screening mammogram for malignant neoplasm of breast: Secondary | ICD-10-CM

## 2020-04-28 DIAGNOSIS — Z0001 Encounter for general adult medical examination with abnormal findings: Secondary | ICD-10-CM | POA: Diagnosis not present

## 2020-04-28 DIAGNOSIS — Z794 Long term (current) use of insulin: Secondary | ICD-10-CM

## 2020-04-28 DIAGNOSIS — E1165 Type 2 diabetes mellitus with hyperglycemia: Secondary | ICD-10-CM

## 2020-04-28 DIAGNOSIS — L630 Alopecia (capitis) totalis: Secondary | ICD-10-CM

## 2020-04-28 DIAGNOSIS — R300 Dysuria: Secondary | ICD-10-CM

## 2020-04-28 DIAGNOSIS — Z124 Encounter for screening for malignant neoplasm of cervix: Secondary | ICD-10-CM | POA: Diagnosis not present

## 2020-04-28 DIAGNOSIS — K219 Gastro-esophageal reflux disease without esophagitis: Secondary | ICD-10-CM | POA: Diagnosis not present

## 2020-04-28 MED ORDER — LANSOPRAZOLE 30 MG PO CPDR: 30.0000 mg | DELAYED_RELEASE_CAPSULE | Freq: Every day | ORAL | 1 refills | Status: DC

## 2020-04-28 NOTE — Progress Notes (Signed)
[facility] [place], [place] [address]  Internal MEDICINE  Office Visit Note  Patient Name: Judy Graves  [id number]  [id number]  Date of Service: 05/25/2020   Pt is here for routine health maintenance examination  Chief Complaint  Patient presents with  . Annual Exam  . Gastroesophageal Reflux  . Hypertension  . controlled substance form    reviewed     The patient is here for health maintenance exam and pap smear.  -continues to have hair loss and thinning. Taking OTC Biotin everyday.  -normal thyroid panel. Normal b12 level.  -good control of blood sugars with last HgbA1c 03/17/2020 at 6.7.  -blood pressure is well managed.  -due to have screening mammogram.     Current Medication: Outpatient Encounter Medications as of 04/28/2020  Medication Sig  . ASPIRIN LOW DOSE 81 MG EC tablet TAKE 1 TABLET DAILY  . atorvastatin (LIPITOR) 40 MG tablet Take 1 tablet (40 mg total) by mouth daily.  . chlorthalidone (HYGROTON) 25 MG tablet TAKE ONE TABLET BY MOUTH DAILY *INCREASE POTASSIUM INTAKE WITH DIET*  . dapagliflozin propanediol (FARXIGA) 10 MG TABS tablet Take 10 mg by mouth daily.  Marland Kitchen EPIPEN 2-PAK 0.3 MG/0.3ML SOAJ injection AS DIRECTED AS DIRECTED INJECTION 2  . glucose blood (ACCU-CHEK GUIDE) test strip Use as instructed once daily. Dx e11.65  . levothyroxine (SYNTHROID) 50 MCG tablet TAKE 1 TABLET DAILY AND 2 TABLETS ON SUNDAYS  . metoprolol succinate (TOPROL-XL) 50 MG 24 hr tablet Take 1 tablet (50 mg total) by mouth daily.  . [DISCONTINUED] lansoprazole (PREVACID) 30 MG capsule Take 1 capsule (30 mg total) by mouth daily.  . lansoprazole (PREVACID) 30 MG capsule Take 1 capsule (30 mg total) by mouth daily.   No facility-administered encounter medications on file as of 04/28/2020.    Surgical History: Past Surgical History:  Procedure Laterality Date  . CARPAL TUNNEL RELEASE Right 2010   Dr. Sabra Heck  . CATARACT EXTRACTION W/PHACO Left  11/02/2015   Procedure: CATARACT EXTRACTION PHACO AND INTRAOCULAR LENS PLACEMENT ([place]) left eye;  Surgeon: Leandrew Koyanagi, MD;  Location: [facility];  Service: Ophthalmology;  Laterality: Left;  . CHONDROPLASTY Left 11/14/2015   Procedure: CHONDROPLASTY;  Surgeon: Dereck Leep, MD;  Location: [facility];  Service: Orthopedics;  Laterality: Left;  . KNEE ARTHROSCOPY WITH LATERAL MENISECTOMY Left 11/14/2015   Procedure: KNEE ARTHROSCOPY WITH LATERAL MENISECTOMY;  Surgeon: Dereck Leep, MD;  Location: [facility];  Service: Orthopedics;  Laterality: Left;  . KNEE ARTHROSCOPY WITH MEDIAL MENISECTOMY Left 11/14/2015   Procedure: KNEE ARTHROSCOPY WITH MEDIAL MENISECTOMY;  Surgeon: Dereck Leep, MD;  Location: [facility];  Service: Orthopedics;  Laterality: Left;  . TONSILLECTOMY    . TUBAL LIGATION      Medical History: Past Medical History:  Diagnosis Date  . Allergy   . Arthritis    in knee  . Chronic constipation   . Chronic insomnia   . GERD (gastroesophageal reflux disease)   . Hemorrhoids   . Hypertension   . Hypothyroidism, adult   . Impingement syndrome of right shoulder    [facility] subacromial bursitis and tendinitis right shoulder  . Lumbar spinal stenosis    Dr. Phyllis Ginger  . Lump or mass in breast    Dr. Jamal Collin evaluated, likely lipoma  . Menopause   . Metabolic syndrome   . Mild carpal tunnel syndrome of right wrist   . Obesity (BMI 35.0-39.9 without comorbidity)   . Proteinuria  Family History: Family History  Problem Relation Age of Onset  . Cancer Mother        Breast  . Diabetes Mother   . Breast cancer Mother   . Hypertension Father   . CVA Father        29's  . Hypertension Sister   . Hypertension Brother   . Breast cancer Paternal Aunt       Review of Systems  Constitutional: Negative for activity change, chills, diaphoresis and fatigue.  HENT: Negative for ear pain, postnasal drip and sinus pressure.   Respiratory: Negative for  cough, shortness of breath and wheezing.   Cardiovascular: Negative for chest pain and palpitations.  Gastrointestinal: Positive for constipation. Negative for abdominal pain, diarrhea, nausea and vomiting.  Endocrine: Negative for cold intolerance, heat intolerance, polydipsia and polyuria.       Blood sugars doing well   Genitourinary: Negative for dysuria, frequency and urgency.  Musculoskeletal: Negative for arthralgias, back pain, gait problem and neck pain.  Skin: Negative for color change.  Allergic/Immunologic: Negative for environmental allergies and food allergies.  Neurological: Negative for dizziness and headaches.  Hematological: Does not bruise/bleed easily.  Psychiatric/Behavioral: Negative for agitation, behavioral problems (depression) and hallucinations.     Today's Vitals   04/28/20 0926  BP: 128/88  Pulse: 84  Resp: 16  Temp: (!) 97.5 F (36.4 C)  SpO2: 96%  Weight: 190 lb 9.6 oz (86.5 kg)  Height: 5\' 6"  (1.676 m)   Body mass index is 30.76 kg/m.  Physical Exam Vitals and nursing note reviewed.  Constitutional:      General: She is not in acute distress.    Appearance: Normal appearance. She is well-developed. She is not diaphoretic.  HENT:     Head: Normocephalic and atraumatic.     Nose: Nose normal.     Mouth/Throat:     Pharynx: No oropharyngeal exudate.  Eyes:     Pupils: Pupils are equal, round, and reactive to light.  Neck:     Thyroid: No thyromegaly.     Vascular: No carotid bruit or JVD.     Trachea: No tracheal deviation.  Cardiovascular:     Rate and Rhythm: Normal rate and regular rhythm.     Pulses:          Dorsalis pedis pulses are 1+ on the right side and 1+ on the left side.       Posterior tibial pulses are 1+ on the right side and 1+ on the left side.     Heart sounds: Normal heart sounds. No murmur heard. No friction rub. No gallop.   Pulmonary:     Effort: Pulmonary effort is normal. No respiratory distress.     Breath  sounds: Normal breath sounds. No wheezing or rales.  Chest:     Chest wall: No tenderness.  Abdominal:     Palpations: Abdomen is soft.  Musculoskeletal:     Cervical back: Normal range of motion and neck supple.     Right foot: Normal range of motion. No deformity or bunion.     Left foot: Normal range of motion. No deformity or bunion.  Feet:     Right foot:     Protective Sensation: 10 sites tested. 10 sites sensed.     Skin integrity: Skin integrity normal.     Toenail Condition: Right toenails are normal.     Left foot:     Protective Sensation: 10 sites tested. 10 sites sensed.  Skin integrity: Skin integrity normal.     Toenail Condition: Left toenails are normal.  Lymphadenopathy:     Cervical: No cervical adenopathy.  Skin:    General: Skin is warm and dry.  Neurological:     Mental Status: She is alert and oriented to person, place, and time.     Cranial Nerves: No cranial nerve deficit.  Psychiatric:        Mood and Affect: Mood normal.        Behavior: Behavior normal.        Thought Content: Thought content normal.        Judgment: Judgment normal.      LABS: Recent Results (from the past 2160 hour(s))  CBC with Differential     Status: Abnormal   Collection Time: 03/14/20  1:17 PM  Result Value Ref Range   WBC 8.0 3.4 - 10.8 x10E3/uL   RBC 5.09 3.77 - 5.28 x10E6/uL   Hemoglobin 16.3 (H) 11.1 - 15.9 g/dL   Hematocrit 46.4 34.0 - 46.6 %   MCV 91 79 - 97 fL   MCH 32.0 26.6 - 33.0 pg   MCHC 35.1 31.5 - 35.7 g/dL   RDW 12.7 11.7 - 15.4 %   Platelets 207 150 - 450 x10E3/uL   Neutrophils 46 Not Estab. %   Lymphs 42 Not Estab. %   Monocytes 10 Not Estab. %   Eos 1 Not Estab. %   Basos 1 Not Estab. %   Neutrophils Absolute 3.8 1.4 - 7.0 x10E3/uL   Lymphocytes Absolute 3.3 (H) 0.7 - 3.1 x10E3/uL   Monocytes Absolute 0.8 0.1 - 0.9 x10E3/uL   EOS (ABSOLUTE) 0.0 0.0 - 0.4 x10E3/uL   Basophils Absolute 0.0 0.0 - 0.2 x10E3/uL   Immature Granulocytes 0 Not  Estab. %   Immature Grans (Abs) 0.0 0.0 - 0.1 x10E3/uL  TSH + free T4     Status: None   Collection Time: 03/14/20  1:17 PM  Result Value Ref Range   TSH 1.610 0.450 - 4.500 uIU/mL   Free T4 1.52 0.82 - 1.77 ng/dL  Lipid Panel With LDL/HDL Ratio     Status: Abnormal   Collection Time: 03/14/20  1:17 PM  Result Value Ref Range   Cholesterol, Total 173 100 - 199 mg/dL   Triglycerides 111 0 - 149 mg/dL   HDL 48 >39 mg/dL   VLDL Cholesterol Cal 20 5 - 40 mg/dL   LDL Chol Calc (NIH) 105 (H) 0 - 99 mg/dL   LDL/HDL Ratio 2.2 0.0 - 3.2 ratio    Comment:                                     LDL/HDL Ratio                                             Men  Women                               1/2 Avg.Risk  1.0    1.5  Avg.Risk  3.6    3.2                                2X Avg.Risk  6.2    5.0                                3X Avg.Risk  8.0    6.1   Vitamin D 1,25 dihydroxy     Status: None   Collection Time: 03/14/20  1:17 PM  Result Value Ref Range   Vitamin D 1, 25 (OH)2 Total 28 pg/mL    Comment: Reference Range: Adults: 21 - 65    Vitamin D2 1, 25 (OH)2 <10 pg/mL    Comment: This test was developed and its performance characteristics determined by [facility]. It has not been cleared or approved by the Food and Drug Administration.    Vitamin D3 1, 25 (OH)2 28 pg/mL    Comment: This test was developed and its performance characteristics determined by [facility]. It has not been cleared or approved by the Food and Drug Administration.   B12     Status: None   Collection Time: 03/14/20  1:17 PM  Result Value Ref Range   Vitamin B-12 372 232 - 1,245 pg/mL  POCT HgB A1C     Status: Abnormal   Collection Time: 03/17/20  3:26 PM  Result Value Ref Range   Hemoglobin A1C 6.7 (A) 4.0 - 5.6 %   HbA1c POC (<> result, manual entry)     HbA1c, POC (prediabetic range)     HbA1c, POC (controlled diabetic range)    UA/M w/rflx Culture, Routine     Status: Abnormal    Collection Time: 04/28/20  9:35 AM   Specimen: Urine   Urine  Result Value Ref Range   Specific Gravity, UA      >=1.030 (A) 1.005 - 1.030   pH, UA 6.0 5.0 - 7.5   Color, UA Yellow Yellow   Appearance Ur Cloudy (A) Clear   Leukocytes,UA Negative Negative   Protein,UA Negative Negative/Trace   Glucose, UA 3+ (A) Negative   Ketones, UA Negative Negative   RBC, UA Negative Negative   Bilirubin, UA Negative Negative   Urobilinogen, Ur 0.2 0.2 - 1.0 mg/dL   Nitrite, UA Negative Negative   Microscopic Examination Comment     Comment: Microscopic follows if indicated.   Microscopic Examination See below:     Comment: Microscopic was indicated and was performed.   Urinalysis Reflex Comment     Comment: This specimen will not reflex to a Urine Culture.  Microscopic Examination     Status: Abnormal   Collection Time: 04/28/20  9:35 AM   Urine  Result Value Ref Range   WBC, UA 0-5 0 - 5 /hpf   RBC 0-2 0 - 2 /hpf   Epithelial Cells (non renal) >10 (A) 0 - 10 /hpf   Casts None seen None seen /lpf   Bacteria, UA Few None seen/Few  IGP, Aptima HPV     Status: None   Collection Time: 04/28/20  9:45 AM  Result Value Ref Range   Interpretation NILM,QC     Comment: NEGATIVE FOR INTRAEPITHELIAL LESION OR MALIGNANCY. THIS SPECIMEN WAS RESCREENED AS PART OF OUR QUALITY CONTROL PROGRAM.    Category NIL     Comment: Negative for Intraepithelial Lesion   Adequacy  ENDO     Comment: Satisfactory for evaluation. Endocervical and/or squamous metaplastic cells (endocervical component) are present.    Clinician Provided ICD10 Comment     Comment: Z12.4   Performed by: Comment     Comment: Jimmy Picket, Supervisory Cytotechnologist (ASCP)   QC reviewed by: Comment     Comment: Raiford Noble, Cytotechnologist (ASCP)   Note: Comment     Comment: The Pap smear is a screening test designed to aid in the detection of premalignant and malignant conditions of the uterine cervix.  It is not a diagnostic  procedure and should not be used as the sole means of detecting cervical cancer.  Both false-positive and false-negative reports do occur.    Test Methodology Comment     Comment: This liquid based ThinPrep(R) pap test was screened with the use of an image guided system.    HPV Aptima Negative Negative    Comment: This nucleic acid amplification test detects fourteen high-risk HPV types (16,18,31,33,35,39,45,51,52,56,58,59,66,68) without differentiation.      Assessment/Plan: 1. Encounter for general adult medical examination with abnormal findings Annual health maintenance exam with pap smear.   2. Type 2 diabetes mellitus with hyperglycemia, with long-term current use of insulin (HCC) Blood sugars stable. Continue medication as prescribed   3. Benign hypertension Stable. Continue bp medication as prescribed   4. Gastroesophageal reflux disease without esophagitis Patient may take lansoprazole 30mg  daily.  - lansoprazole (PREVACID) 30 MG capsule; Take 1 capsule (30 mg total) by mouth daily.  Dispense: 90 capsule; Refill: 1  5. Alopecia (capitis) totalis Unclear etiology normal thyroid and anemia panels. Refer to dermatology for further evaluation.  - Ambulatory referral to Dermatology  6. Routine cervical smear - IGP, Aptima HPV  7. Encounter for screening mammogram for malignant neoplasm of breast - MM DIGITAL SCREENING BILATERAL; Future  8. Dysuria - UA/M w/rflx Culture, Routine  General Counseling: Story verbalizes understanding of the findings of todays visit and agrees with plan of treatment. I have discussed any further diagnostic evaluation that may be needed or ordered today. We also reviewed her medications today. she has been encouraged to call the office with any questions or concerns that should arise related to todays visit.    Counseling:  This patient was seen by Leretha Pol FNP Collaboration with Dr Lavera Guise as a part of collaborative care  agreement  Orders Placed This Encounter  Procedures  . Microscopic Examination  . MM DIGITAL SCREENING BILATERAL  . UA/M w/rflx Culture, Routine  . Ambulatory referral to Dermatology    Meds ordered this encounter  Medications  . lansoprazole (PREVACID) 30 MG capsule    Sig: Take 1 capsule (30 mg total) by mouth daily.    Dispense:  90 capsule    Refill:  1    Order Specific Question:   Supervising Provider    Answer:   Lavera Guise [id number]    Total time spent: 56 Minutes  Time spent includes review of chart, medications, test results, and follow up plan with the patient.     Lavera Guise, MD  Internal Medicine

## 2020-04-29 LAB — MICROSCOPIC EXAMINATION
Casts: NONE SEEN /lpf
Epithelial Cells (non renal): >10 /hpf — AB (ref 0–10)

## 2020-04-29 LAB — UA/M W/RFLX CULTURE, ROUTINE
Bilirubin, UA: NEGATIVE
Ketones, UA: NEGATIVE
Leukocytes,UA: NEGATIVE
Nitrite, UA: NEGATIVE
Protein,UA: NEGATIVE
RBC, UA: NEGATIVE
Specific Gravity, UA: >=1.03 — AB (ref 1.005–1.030)
Urobilinogen, Ur: 0.2 mg/dL (ref 0.2–1.0)
pH, UA: 6 (ref 5.0–7.5)

## 2020-05-01 NOTE — Progress Notes (Signed)
Reviewed

## 2020-05-03 LAB — IGP, APTIMA HPV: HPV Aptima: NEGATIVE

## 2020-05-04 NOTE — Progress Notes (Signed)
Please let the patient know that her pap smear was normal. Thanks.

## 2020-05-25 ENCOUNTER — Encounter: Payer: Self-pay | Admitting: Nurse Practitioner

## 2020-05-25 DIAGNOSIS — Z124 Encounter for screening for malignant neoplasm of cervix: Secondary | ICD-10-CM | POA: Insufficient documentation

## 2020-05-25 DIAGNOSIS — L630 Alopecia (capitis) totalis: Secondary | ICD-10-CM | POA: Insufficient documentation

## 2020-05-25 DIAGNOSIS — Z1231 Encounter for screening mammogram for malignant neoplasm of breast: Secondary | ICD-10-CM | POA: Insufficient documentation

## 2020-06-06 ENCOUNTER — Other Ambulatory Visit: Payer: Self-pay

## 2020-06-06 ENCOUNTER — Ambulatory Visit
Admission: RE | Admit: 2020-06-06 | Discharge: 2020-06-06 | Disposition: A | Discharge status: Home / Self Care | Payer: BLUE CROSS/BLUE SHIELD | Source: Ambulatory Visit | Attending: Nurse Practitioner | Admitting: Nurse Practitioner

## 2020-06-06 DIAGNOSIS — Z1231 Encounter for screening mammogram for malignant neoplasm of breast: Secondary | ICD-10-CM | POA: Insufficient documentation

## 2020-06-16 ENCOUNTER — Other Ambulatory Visit: Payer: Self-pay

## 2020-06-16 DIAGNOSIS — E119 Type 2 diabetes mellitus without complications: Secondary | ICD-10-CM

## 2020-06-16 MED ORDER — DAPAGLIFLOZIN PROPANEDIOL 10 MG PO TABS: 10.0000 mg | ORAL_TABLET | Freq: Every day | ORAL | 3 refills | Status: DC

## 2020-06-23 ENCOUNTER — Ambulatory Visit: Payer: BLUE CROSS/BLUE SHIELD | Admitting: Hospice and Palliative Medicine

## 2020-06-29 ENCOUNTER — Ambulatory Visit: Payer: BLUE CROSS/BLUE SHIELD | Admitting: Hospice and Palliative Medicine

## 2020-06-29 ENCOUNTER — Encounter: Payer: Self-pay | Admitting: Hospice and Palliative Medicine

## 2020-06-29 ENCOUNTER — Other Ambulatory Visit: Payer: Self-pay

## 2020-06-29 VITALS — BP 140/80 | HR 80 | Temp 97.2°F | Resp 16 | Ht 66.0 in | Wt 191.0 lb

## 2020-06-29 DIAGNOSIS — Z794 Long term (current) use of insulin: Secondary | ICD-10-CM | POA: Diagnosis not present

## 2020-06-29 DIAGNOSIS — E785 Hyperlipidemia, unspecified: Secondary | ICD-10-CM

## 2020-06-29 DIAGNOSIS — E038 Other specified hypothyroidism: Secondary | ICD-10-CM

## 2020-06-29 DIAGNOSIS — E1165 Type 2 diabetes mellitus with hyperglycemia: Secondary | ICD-10-CM | POA: Diagnosis not present

## 2020-06-29 DIAGNOSIS — I10 Essential (primary) hypertension: Secondary | ICD-10-CM

## 2020-06-29 DIAGNOSIS — E119 Type 2 diabetes mellitus without complications: Secondary | ICD-10-CM

## 2020-06-29 DIAGNOSIS — K219 Gastro-esophageal reflux disease without esophagitis: Secondary | ICD-10-CM

## 2020-06-29 LAB — POCT GLYCOSYLATED HEMOGLOBIN (HGB A1C): Hemoglobin A1C: 6.6 % — AB (ref 4.0–5.6)

## 2020-06-29 MED ORDER — ATORVASTATIN CALCIUM 40 MG PO TABS: 40.0000 mg | ORAL_TABLET | Freq: Every day | ORAL | 3 refills | Status: DC

## 2020-06-29 MED ORDER — LANSOPRAZOLE 30 MG PO CPDR: 30.0000 mg | DELAYED_RELEASE_CAPSULE | Freq: Every day | ORAL | 1 refills | Status: DC

## 2020-06-29 MED ORDER — DAPAGLIFLOZIN PROPANEDIOL 10 MG PO TABS: 10.0000 mg | ORAL_TABLET | Freq: Every day | ORAL | 3 refills | Status: DC

## 2020-06-29 MED ORDER — METOPROLOL SUCCINATE ER 50 MG PO TB24: 50.0000 mg | ORAL_TABLET | Freq: Every day | ORAL | 1 refills | Status: DC

## 2020-06-29 MED ORDER — LEVOTHYROXINE SODIUM 50 MCG PO TABS
ORAL_TABLET | ORAL | 1 refills | Status: DC
Start: 2020-06-29 — End: 2020-07-27

## 2020-06-29 NOTE — Progress Notes (Signed)
[facility] [place], [place] [address]  Internal MEDICINE  Office Visit Note  Patient Name: Judy Graves  [id number]  [id number]  Date of Service: 07/02/2020  Chief Complaint  Patient presents with  . Follow-up    Discuss blood sugar  . Gastroesophageal Reflux  . Hypertension    HPI Patient is here for routine follow-up DM-glucose levels have been well controlled at home Currently taking [place] with no negative side effects  Reviewed recent labs-LDL elevated, hemoglobin minimally elevated Needs updated CMP  Mammogram up to date Cologuard due for repeat screening later this year  Current Medication: Outpatient Encounter Medications as of 06/29/2020  Medication Sig  . ASPIRIN LOW DOSE 81 MG EC tablet TAKE 1 TABLET DAILY  . atorvastatin (LIPITOR) 40 MG tablet Take 1 tablet (40 mg total) by mouth daily.  . chlorthalidone (HYGROTON) 25 MG tablet TAKE ONE TABLET BY MOUTH DAILY *INCREASE POTASSIUM INTAKE WITH DIET*  . dapagliflozin propanediol (FARXIGA) 10 MG TABS tablet Take 1 tablet (10 mg total) by mouth daily.  Marland Kitchen EPIPEN 2-PAK 0.3 MG/0.3ML SOAJ injection AS DIRECTED AS DIRECTED INJECTION 2  . glucose blood (ACCU-CHEK GUIDE) test strip Use as instructed once daily. Dx e11.65  . lansoprazole (PREVACID) 30 MG capsule Take 1 capsule (30 mg total) by mouth daily.  Marland Kitchen levothyroxine (SYNTHROID) 50 MCG tablet TAKE 1 TABLET DAILY AND 2 TABLETS ON SUNDAYS  . metoprolol succinate (TOPROL-XL) 50 MG 24 hr tablet Take 1 tablet (50 mg total) by mouth daily.  . [DISCONTINUED] atorvastatin (LIPITOR) 40 MG tablet Take 1 tablet (40 mg total) by mouth daily.  . [DISCONTINUED] dapagliflozin propanediol (FARXIGA) 10 MG TABS tablet Take 1 tablet (10 mg total) by mouth daily.  . [DISCONTINUED] lansoprazole (PREVACID) 30 MG capsule Take 1 capsule (30 mg total) by mouth daily.  . [DISCONTINUED] levothyroxine (SYNTHROID) 50 MCG tablet TAKE 1 TABLET DAILY AND 2 TABLETS ON SUNDAYS   . [DISCONTINUED] metoprolol succinate (TOPROL-XL) 50 MG 24 hr tablet Take 1 tablet (50 mg total) by mouth daily.   No facility-administered encounter medications on file as of 06/29/2020.    Surgical History: Past Surgical History:  Procedure Laterality Date  . CARPAL TUNNEL RELEASE Right 2010   Dr. Sabra Heck  . CATARACT EXTRACTION W/PHACO Left 11/02/2015   Procedure: CATARACT EXTRACTION PHACO AND INTRAOCULAR LENS PLACEMENT ([place]) left eye;  Surgeon: Leandrew Koyanagi, MD;  Location: [facility];  Service: Ophthalmology;  Laterality: Left;  . CHONDROPLASTY Left 11/14/2015   Procedure: CHONDROPLASTY;  Surgeon: Dereck Leep, MD;  Location: [facility];  Service: Orthopedics;  Laterality: Left;  . KNEE ARTHROSCOPY WITH LATERAL MENISECTOMY Left 11/14/2015   Procedure: KNEE ARTHROSCOPY WITH LATERAL MENISECTOMY;  Surgeon: Dereck Leep, MD;  Location: [facility];  Service: Orthopedics;  Laterality: Left;  . KNEE ARTHROSCOPY WITH MEDIAL MENISECTOMY Left 11/14/2015   Procedure: KNEE ARTHROSCOPY WITH MEDIAL MENISECTOMY;  Surgeon: Dereck Leep, MD;  Location: [facility];  Service: Orthopedics;  Laterality: Left;  . TONSILLECTOMY    . TUBAL LIGATION      Medical History: Past Medical History:  Diagnosis Date  . Allergy   . Arthritis    in knee  . Chronic constipation   . Chronic insomnia   . GERD (gastroesophageal reflux disease)   . Hemorrhoids   . Hypertension   . Hypothyroidism, adult   . Impingement syndrome of right shoulder    [facility] subacromial bursitis and tendinitis right shoulder  . Lumbar spinal  stenosis    Dr. Phyllis Ginger  . Lump or mass in breast    Dr. Jamal Collin evaluated, likely lipoma  . Menopause   . Metabolic syndrome   . Mild carpal tunnel syndrome of right wrist   . Obesity (BMI 35.0-39.9 without comorbidity)   . Proteinuria     Family History: Family History  Problem Relation Age of Onset  . Cancer Mother        Breast  . Diabetes Mother   . Breast  cancer Mother   . Hypertension Father   . CVA Father        31's  . Hypertension Sister   . Hypertension Brother   . Breast cancer Paternal Aunt     Social History   Socioeconomic History  . Marital status: Married    Spouse name: Not on file  . Number of children: Not on file  . Years of education: Not on file  . Highest education level: Not on file  Occupational History  . Not on file  Tobacco Use  . Smoking status: Never Smoker  . Smokeless tobacco: Never Used  Substance and Sexual Activity  . Alcohol use: No    Alcohol/week: 0.0 standard drinks  . Drug use: No  . Sexual activity: Yes    Partners: Male  Other Topics Concern  . Not on file  Social History Narrative  . Not on file   Social Determinants of Health   Financial Resource Strain: Not on file  Food Insecurity: Not on file  Transportation Needs: Not on file  Physical Activity: Not on file  Stress: Not on file  Social Connections: Not on file  Intimate Partner Violence: Not on file      Review of Systems  Constitutional: Negative for chills, diaphoresis and fatigue.  HENT: Negative for ear pain, postnasal drip and sinus pressure.   Eyes: Negative for photophobia, discharge, redness, itching and visual disturbance.  Respiratory: Negative for cough, shortness of breath and wheezing.   Cardiovascular: Negative for chest pain, palpitations and leg swelling.  Gastrointestinal: Negative for abdominal pain, constipation, diarrhea, nausea and vomiting.  Genitourinary: Negative for dysuria and flank pain.  Musculoskeletal: Negative for arthralgias, back pain, gait problem and neck pain.  Skin: Negative for color change.  Allergic/Immunologic: Negative for environmental allergies and food allergies.  Neurological: Negative for dizziness and headaches.  Hematological: Does not bruise/bleed easily.  Psychiatric/Behavioral: Negative for agitation, behavioral problems (depression) and hallucinations.    Vital  Signs: BP 140/80   Pulse 80   Temp (!) 97.2 F (36.2 C)   Resp 16   Ht 5\' 6"  (1.676 m)   Wt 191 lb (86.6 kg)   SpO2 97%   BMI 30.83 kg/m    Physical Exam Vitals reviewed.  Constitutional:      Appearance: Normal appearance. She is obese.  Cardiovascular:     Rate and Rhythm: Normal rate and regular rhythm.     Pulses: Normal pulses.     Heart sounds: Normal heart sounds.  Pulmonary:     Effort: Pulmonary effort is normal.     Breath sounds: Normal breath sounds.  Abdominal:     General: Abdomen is flat.     Palpations: Abdomen is soft.  Musculoskeletal:        General: Normal range of motion.     Cervical back: Normal range of motion.  Skin:    General: Skin is warm.  Neurological:     General: No focal deficit present.  Mental Status: She is alert and oriented to person, place, and time. Mental status is at baseline.  Psychiatric:        Mood and Affect: Mood normal.        Behavior: Behavior normal.        Thought Content: Thought content normal.        Judgment: Judgment normal.    Assessment/Plan: 1. Type 2 diabetes mellitus with hyperglycemia, with long-term current use of insulin (HCC) A1C 6.6--well controlled Continue with current therapy Refilled Farxiga 10 mg - POCT HgB A1C  2. Gastroesophageal reflux disease without esophagitis Symptoms remain well controlled, requesting refills - lansoprazole (PREVACID) 30 MG capsule; Take 1 capsule (30 mg total) by mouth daily.  Dispense: 90 capsule; Refill: 1  3. Dyslipidemia LDL minimally elevated, continue with statin therapy Consider further vascular studies - atorvastatin (LIPITOR) 40 MG tablet; Take 1 tablet (40 mg total) by mouth daily.  Dispense: 90 tablet; Refill: 3  4. Benign hypertension BP and HR well controlled today, monitor for bradycardia on BB - metoprolol succinate (TOPROL-XL) 50 MG 24 hr tablet; Take 1 tablet (50 mg total) by mouth daily.  Dispense: 90 tablet; Refill: 1  5. Other  specified hypothyroidism Recent thyroid panel WNL, continue on current dose of levothyroxine - levothyroxine (SYNTHROID) 50 MCG tablet; TAKE 1 TABLET DAILY AND 2 TABLETS ON SUNDAYS  Dispense: 102 tablet; Refill: 1   General Counseling: Evonna verbalizes understanding of the findings of todays visit and agrees with plan of treatment. I have discussed any further diagnostic evaluation that may be needed or ordered today. We also reviewed her medications today. she has been encouraged to call the office with any questions or concerns that should arise related to todays visit.    Orders Placed This Encounter  Procedures  . POCT HgB A1C    Meds ordered this encounter  Medications  . lansoprazole (PREVACID) 30 MG capsule    Sig: Take 1 capsule (30 mg total) by mouth daily.    Dispense:  90 capsule    Refill:  1  . atorvastatin (LIPITOR) 40 MG tablet    Sig: Take 1 tablet (40 mg total) by mouth daily.    Dispense:  90 tablet    Refill:  3  . metoprolol succinate (TOPROL-XL) 50 MG 24 hr tablet    Sig: Take 1 tablet (50 mg total) by mouth daily.    Dispense:  90 tablet    Refill:  1  . levothyroxine (SYNTHROID) 50 MCG tablet    Sig: TAKE 1 TABLET DAILY AND 2 TABLETS ON SUNDAYS    Dispense:  102 tablet    Refill:  1  . dapagliflozin propanediol (FARXIGA) 10 MG TABS tablet    Sig: Take 1 tablet (10 mg total) by mouth daily.    Dispense:  90 tablet    Refill:  3    Please fill as 90 day prescription with next fill. Thanks.    Time spent: 30 Minutes   This patient was seen by Theodoro Grist AGNP-C in Collaboration with Dr Lavera Guise as a part of collaborative care agreement     Tanna Furry. Shelene Krage AGNP-C Internal medicine

## 2020-06-30 ENCOUNTER — Other Ambulatory Visit: Payer: BLUE CROSS/BLUE SHIELD

## 2020-07-02 ENCOUNTER — Encounter: Payer: Self-pay | Admitting: Hospice and Palliative Medicine

## 2020-07-04 ENCOUNTER — Ambulatory Visit: Admission: RE | Admit: 2020-07-04 | Payer: BLUE CROSS/BLUE SHIELD | Source: Home / Self Care | Admitting: Ophthalmology

## 2020-07-04 ENCOUNTER — Encounter: Admission: RE | Payer: Self-pay | Source: Home / Self Care

## 2020-07-04 SURGERY — PHACOEMULSIFICATION, CATARACT, WITH IOL INSERTION
Anesthesia: Topical | Laterality: Right

## 2020-07-05 ENCOUNTER — Other Ambulatory Visit: Payer: Self-pay

## 2020-07-27 ENCOUNTER — Other Ambulatory Visit: Payer: Self-pay

## 2020-07-27 DIAGNOSIS — Z794 Long term (current) use of insulin: Secondary | ICD-10-CM

## 2020-07-27 DIAGNOSIS — K219 Gastro-esophageal reflux disease without esophagitis: Secondary | ICD-10-CM

## 2020-07-27 DIAGNOSIS — E038 Other specified hypothyroidism: Secondary | ICD-10-CM

## 2020-07-27 DIAGNOSIS — E1165 Type 2 diabetes mellitus with hyperglycemia: Secondary | ICD-10-CM

## 2020-07-27 DIAGNOSIS — I10 Essential (primary) hypertension: Secondary | ICD-10-CM

## 2020-07-27 DIAGNOSIS — E785 Hyperlipidemia, unspecified: Secondary | ICD-10-CM

## 2020-07-27 MED ORDER — LANSOPRAZOLE 30 MG PO CPDR: 30.0000 mg | DELAYED_RELEASE_CAPSULE | Freq: Every day | ORAL | 1 refills | Status: DC

## 2020-07-27 MED ORDER — LEVOTHYROXINE SODIUM 50 MCG PO TABS: ORAL_TABLET | ORAL | 1 refills | Status: DC

## 2020-07-27 MED ORDER — CHLORTHALIDONE 25 MG PO TABS: ORAL_TABLET | ORAL | 3 refills | Status: DC

## 2020-07-27 MED ORDER — METOPROLOL SUCCINATE ER 50 MG PO TB24: 50.0000 mg | ORAL_TABLET | Freq: Every day | ORAL | 1 refills | Status: DC

## 2020-07-27 MED ORDER — ASPIRIN 81 MG PO TBEC: 81.0000 mg | DELAYED_RELEASE_TABLET | Freq: Every day | ORAL | 3 refills | Status: AC

## 2020-07-27 MED ORDER — ATORVASTATIN CALCIUM 40 MG PO TABS: 40.0000 mg | ORAL_TABLET | Freq: Every day | ORAL | 3 refills | Status: DC

## 2020-07-28 ENCOUNTER — Other Ambulatory Visit: Payer: Self-pay | Admitting: Internal Medicine

## 2020-07-28 DIAGNOSIS — K219 Gastro-esophageal reflux disease without esophagitis: Secondary | ICD-10-CM

## 2020-09-07 ENCOUNTER — Other Ambulatory Visit: Payer: Self-pay | Admitting: Nurse Practitioner

## 2020-09-29 ENCOUNTER — Other Ambulatory Visit: Payer: Self-pay | Admitting: Physician Assistant

## 2020-09-29 ENCOUNTER — Other Ambulatory Visit: Payer: Self-pay

## 2020-09-29 ENCOUNTER — Encounter: Payer: Self-pay | Admitting: Physician Assistant

## 2020-09-29 ENCOUNTER — Ambulatory Visit (INDEPENDENT_AMBULATORY_CARE_PROVIDER_SITE_OTHER): Payer: BLUE CROSS/BLUE SHIELD | Admitting: Physician Assistant

## 2020-09-29 DIAGNOSIS — K219 Gastro-esophageal reflux disease without esophagitis: Secondary | ICD-10-CM

## 2020-09-29 DIAGNOSIS — E039 Hypothyroidism, unspecified: Secondary | ICD-10-CM

## 2020-09-29 DIAGNOSIS — Z794 Long term (current) use of insulin: Secondary | ICD-10-CM | POA: Diagnosis not present

## 2020-09-29 DIAGNOSIS — E1165 Type 2 diabetes mellitus with hyperglycemia: Secondary | ICD-10-CM

## 2020-09-29 DIAGNOSIS — R5383 Other fatigue: Secondary | ICD-10-CM

## 2020-09-29 DIAGNOSIS — I10 Essential (primary) hypertension: Secondary | ICD-10-CM

## 2020-09-29 LAB — POCT GLYCOSYLATED HEMOGLOBIN (HGB A1C): Hemoglobin A1C: 6.5 % — AB (ref 4.0–5.6)

## 2020-09-29 MED ORDER — DAPAGLIFLOZIN PROPANEDIOL 10 MG PO TABS: 10.0000 mg | ORAL_TABLET | Freq: Every day | ORAL | 3 refills | Status: DC

## 2020-09-29 MED ORDER — OMEPRAZOLE 40 MG PO CPDR: 40.0000 mg | DELAYED_RELEASE_CAPSULE | Freq: Every day | ORAL | 1 refills | Status: DC

## 2020-09-29 MED ORDER — ACCU-CHEK GUIDE VI STRP: ORAL_STRIP | 3 refills | Status: DC

## 2020-09-29 NOTE — Progress Notes (Signed)
[facility], [place] [address]  Internal MEDICINE  Office Visit Note  Patient Name: Judy Graves  [id number]  [id number]  Date of Service: 09/29/2020  Chief Complaint  Patient presents with  . Follow-up  . Hypertension  . Gastroesophageal Reflux    HPI -Pt is here for routine follow up -BG at home not checked. Stopped farxiga 2-3 weeks ago bc it ran out and didn't refill it bc wasn't sure why she was taking it and was concerned that it was a kidney medication. Educated that this is for treatment of diabetes and that uncontrolled diabetes can impact kidneys. -BP at home sometimes 150/70. Has had bad reactions to alternative BP meds in past and has done well on chlorthalidone and metoprolol. -Need to update CMP and thyroid labs -Patient states that lansoprazole was too expensive and would like an alternative  Current Medication: Outpatient Encounter Medications as of 09/29/2020  Medication Sig  . [DISCONTINUED] omeprazole (PRILOSEC) 40 MG capsule Take 1 capsule (40 mg total) by mouth daily.  Marland Kitchen aspirin (ASPIRIN LOW DOSE) 81 MG EC tablet Take 1 tablet (81 mg total) by mouth daily. Swallow whole.  Marland Kitchen atorvastatin (LIPITOR) 40 MG tablet Take 1 tablet (40 mg total) by mouth daily.  . chlorthalidone (HYGROTON) 25 MG tablet TAKE ONE TABLET BY MOUTH DAILY *INCREASE POTASSIUM INTAKE WITH DIET*  . dapagliflozin propanediol (FARXIGA) 10 MG TABS tablet Take 1 tablet (10 mg total) by mouth daily.  Marland Kitchen EPIPEN 2-PAK 0.3 MG/0.3ML SOAJ injection AS DIRECTED AS DIRECTED INJECTION 2  . glucose blood (ACCU-CHEK GUIDE) test strip Use as instructed once daily. Dx e11.65  . levothyroxine (SYNTHROID) 50 MCG tablet TAKE 1 TABLET DAILY AND 2 TABLETS ON SUNDAYS  . metoprolol succinate (TOPROL-XL) 50 MG 24 hr tablet Take 1 tablet (50 mg total) by mouth daily.  . [DISCONTINUED] dapagliflozin propanediol (FARXIGA) 10 MG TABS tablet Take 1 tablet (10 mg total) by mouth daily.  .  [DISCONTINUED] glucose blood (ACCU-CHEK GUIDE) test strip Use as instructed once daily. Dx e11.65  . [DISCONTINUED] lansoprazole (PREVACID) 30 MG capsule TAKE 1 CAPSULE BY MOUTH EVERY DAY   No facility-administered encounter medications on file as of 09/29/2020.    Surgical History: Past Surgical History:  Procedure Laterality Date  . CARPAL TUNNEL RELEASE Right 2010   Dr. Sabra Heck  . CATARACT EXTRACTION W/PHACO Left 11/02/2015   Procedure: CATARACT EXTRACTION PHACO AND INTRAOCULAR LENS PLACEMENT ([place]) left eye;  Surgeon: Leandrew Koyanagi, MD;  Location: [facility];  Service: Ophthalmology;  Laterality: Left;  . CHONDROPLASTY Left 11/14/2015   Procedure: CHONDROPLASTY;  Surgeon: Dereck Leep, MD;  Location: [facility];  Service: Orthopedics;  Laterality: Left;  . KNEE ARTHROSCOPY WITH LATERAL MENISECTOMY Left 11/14/2015   Procedure: KNEE ARTHROSCOPY WITH LATERAL MENISECTOMY;  Surgeon: Dereck Leep, MD;  Location: [facility];  Service: Orthopedics;  Laterality: Left;  . KNEE ARTHROSCOPY WITH MEDIAL MENISECTOMY Left 11/14/2015   Procedure: KNEE ARTHROSCOPY WITH MEDIAL MENISECTOMY;  Surgeon: Dereck Leep, MD;  Location: [facility];  Service: Orthopedics;  Laterality: Left;  . TONSILLECTOMY    . TUBAL LIGATION      Medical History: Past Medical History:  Diagnosis Date  . Allergy   . Arthritis    in knee  . Chronic constipation   . Chronic insomnia   . Diabetes mellitus without complication ([place])   . GERD (gastroesophageal reflux disease)   . Hemorrhoids   . Hypertension   . Hypothyroidism,  adult   . Impingement syndrome of right shoulder    [facility] subacromial bursitis and tendinitis right shoulder  . Lumbar spinal stenosis    Dr. Phyllis Ginger  . Lump or mass in breast    Dr. Jamal Collin evaluated, likely lipoma  . Menopause   . Metabolic syndrome   . Mild carpal tunnel syndrome of right wrist   . Obesity (BMI 35.0-39.9 without comorbidity)   . Proteinuria      Family History: Family History  Problem Relation Age of Onset  . Cancer Mother        Breast  . Diabetes Mother   . Breast cancer Mother   . Hypertension Father   . CVA Father        37's  . Hypertension Sister   . Hypertension Brother   . Breast cancer Paternal Aunt     Social History   Socioeconomic History  . Marital status: Married    Spouse name: Not on file  . Number of children: Not on file  . Years of education: Not on file  . Highest education level: Not on file  Occupational History  . Not on file  Tobacco Use  . Smoking status: Never Smoker  . Smokeless tobacco: Never Used  Substance and Sexual Activity  . Alcohol use: No    Alcohol/week: 0.0 standard drinks  . Drug use: No  . Sexual activity: Yes    Partners: Male  Other Topics Concern  . Not on file  Social History Narrative  . Not on file   Social Determinants of Health   Financial Resource Strain: Not on file  Food Insecurity: Not on file  Transportation Needs: Not on file  Physical Activity: Not on file  Stress: Not on file  Social Connections: Not on file  Intimate Partner Violence: Not on file      Review of Systems  Constitutional: Negative for chills, fatigue and unexpected weight change.  HENT: Negative for congestion, postnasal drip, rhinorrhea, sneezing and sore throat.   Eyes: Negative for redness.  Respiratory: Negative for cough, chest tightness and shortness of breath.   Cardiovascular: Negative for chest pain and palpitations.  Gastrointestinal: Positive for constipation. Negative for abdominal pain, diarrhea, nausea and vomiting.  Genitourinary: Negative for dysuria and frequency.  Musculoskeletal: Negative for arthralgias, back pain, joint swelling and neck pain.  Skin: Negative for rash.  Neurological: Negative.  Negative for tremors and numbness.  Hematological: Negative for adenopathy. Does not bruise/bleed easily.  Psychiatric/Behavioral: Negative for behavioral  problems (Depression), sleep disturbance and suicidal ideas. The patient is not nervous/anxious.     Vital Signs: BP (!) 156/72   Pulse 76   Temp (!) 97.2 F (36.2 C)   Resp 16   Ht 5\' 6"  (1.676 m)   Wt 195 lb 3.2 oz (88.5 kg)   SpO2 97%   BMI 31.51 kg/m    Physical Exam Vitals and nursing note reviewed.  Constitutional:      General: She is not in acute distress.    Appearance: She is well-developed. She is obese. She is not diaphoretic.  HENT:     Head: Normocephalic and atraumatic.     Mouth/Throat:     Pharynx: No oropharyngeal exudate.  Eyes:     Pupils: Pupils are equal, round, and reactive to light.  Neck:     Thyroid: No thyromegaly.     Vascular: No JVD.     Trachea: No tracheal deviation.  Cardiovascular:  Rate and Rhythm: Normal rate and regular rhythm.     Heart sounds: Normal heart sounds. No murmur heard. No friction rub. No gallop.   Pulmonary:     Effort: Pulmonary effort is normal. No respiratory distress.     Breath sounds: No wheezing or rales.  Chest:     Chest wall: No tenderness.  Abdominal:     General: Bowel sounds are normal.     Palpations: Abdomen is soft.  Musculoskeletal:        General: Normal range of motion.     Cervical back: Normal range of motion and neck supple.  Lymphadenopathy:     Cervical: No cervical adenopathy.  Skin:    General: Skin is warm and dry.  Neurological:     Mental Status: She is alert and oriented to person, place, and time.     Cranial Nerves: No cranial nerve deficit.  Psychiatric:        Behavior: Behavior normal.        Thought Content: Thought content normal.        Judgment: Judgment normal.        Assessment/Plan: 1. Type 2 diabetes mellitus with hyperglycemia, with long-term current use of insulin (HCC) - POCT HgB A1C is 6.5, will continue farxiga and improve diet and exercise - dapagliflozin propanediol (FARXIGA) 10 MG TABS tablet; Take 1 tablet (10 mg total) by mouth daily.  Dispense:  90 tablet; Refill: 3 - glucose blood (ACCU-CHEK GUIDE) test strip; Use as instructed once daily. Dx e11.65  Dispense: 100 each; Refill: 3  2. Benign hypertension Will continue chlorthalidone and metoprolol, discussed potentially adding additional medication if BP continues to rise. Pt allergic to ACEI and did not do well on amlodipine in past. Will update CMP with chlorthalidone use. - Comprehensive metabolic panel  3. Gastroesophageal reflux disease without esophagitis Lansoprazole too expensive will try alternative PPI and pt will consider trying OTC option  4. Hypothyroidism, unspecified type Continue synthroid, will update labs and adjust accordingly - TSH + free T4  5. Other fatigue - Comprehensive metabolic panel - TSH + free T4   General Counseling: Tijuana verbalizes understanding of the findings of todays visit and agrees with plan of treatment. I have discussed any further diagnostic evaluation that may be needed or ordered today. We also reviewed her medications today. she has been encouraged to call the office with any questions or concerns that should arise related to todays visit.    Orders Placed This Encounter  Procedures  . Comprehensive metabolic panel  . TSH + free T4  . POCT HgB A1C    Meds ordered this encounter  Medications  . DISCONTD: omeprazole (PRILOSEC) 40 MG capsule    Sig: Take 1 capsule (40 mg total) by mouth daily.    Dispense:  90 capsule    Refill:  1  . dapagliflozin propanediol (FARXIGA) 10 MG TABS tablet    Sig: Take 1 tablet (10 mg total) by mouth daily.    Dispense:  90 tablet    Refill:  3    Please fill as 90 day prescription with next fill. Thanks.  Marland Kitchen glucose blood (ACCU-CHEK GUIDE) test strip    Sig: Use as instructed once daily. Dx e11.65    Dispense:  100 each    Refill:  3    This patient was seen by Drema Dallas, PA-C in collaboration with Dr. Clayborn Bigness as a part of collaborative care agreement.   Total time spent:35  Minutes Time spent includes review of chart, medications, test results, and follow up plan with the patient.      Dr Lavera Guise Internal medicine

## 2020-10-06 ENCOUNTER — Ambulatory Visit (INDEPENDENT_AMBULATORY_CARE_PROVIDER_SITE_OTHER): Payer: BLUE CROSS/BLUE SHIELD | Admitting: Physician Assistant

## 2020-10-06 ENCOUNTER — Other Ambulatory Visit: Payer: Self-pay

## 2020-10-06 ENCOUNTER — Encounter: Payer: Self-pay | Admitting: Physician Assistant

## 2020-10-06 DIAGNOSIS — L309 Dermatitis, unspecified: Secondary | ICD-10-CM

## 2020-10-06 NOTE — Progress Notes (Signed)
[facility] [place], [place] [address]  Internal MEDICINE  Office Visit Note  Patient Name: Judy Graves  [id number]  [id number]  Date of Service: 10/09/2020  Chief Complaint  Patient presents with  . Acute Visit    rash on right ankle from mowing the lawn..unknown reason, hurts, itchy, pt is using triamcinolone 0.5% ointment and doesn't know if its helping, on Saturday rash was on back, forehead and right leg  . Quality Metric Gaps    shingrix     HPI Pt is here for a sick visit. -Mowing on Saturday and did feel like something itchy in sock and found a tiny ant. Did not feel any bite and no signs of puncture/bite.  -Right ankle broke out in a rash and then had some on back and hands felt a little itchy. Changed clothes and washed up but still there. Took benadryl and got a little better and then went to urgent care Sunday bc it was still present on ankle but back had cleared already. Given traimcinolone cream and unsure if it is helping now--has more pustule appearance. Not itching at all now. Denies any possibility of poison ivy exposure  Current Medication:  Outpatient Encounter Medications as of 10/06/2020  Medication Sig  . aspirin (ASPIRIN LOW DOSE) 81 MG EC tablet Take 1 tablet (81 mg total) by mouth daily. Swallow whole.  Marland Kitchen atorvastatin (LIPITOR) 40 MG tablet Take 1 tablet (40 mg total) by mouth daily.  . chlorthalidone (HYGROTON) 25 MG tablet TAKE ONE TABLET BY MOUTH DAILY *INCREASE POTASSIUM INTAKE WITH DIET*  . dapagliflozin propanediol (FARXIGA) 10 MG TABS tablet Take 1 tablet (10 mg total) by mouth daily.  Marland Kitchen EPIPEN 2-PAK 0.3 MG/0.3ML SOAJ injection AS DIRECTED AS DIRECTED INJECTION 2  . glucose blood (ACCU-CHEK GUIDE) test strip Use as instructed once daily. Dx e11.65  . levothyroxine (SYNTHROID) 50 MCG tablet TAKE 1 TABLET DAILY AND 2 TABLETS ON SUNDAYS  . metoprolol succinate (TOPROL-XL) 50 MG 24 hr tablet Take 1 tablet (50 mg total) by mouth  daily.  Marland Kitchen omeprazole (PRILOSEC) 40 MG capsule TAKE 1 CAPSULE (40 MG TOTAL) BY MOUTH DAILY.  Marland Kitchen triamcinolone ointment (KENALOG) 0.5 % Apply topically 2 (two) times daily.   No facility-administered encounter medications on file as of 10/06/2020.      Medical History: Past Medical History:  Diagnosis Date  . Allergy   . Arthritis    in knee  . Chronic constipation   . Chronic insomnia   . Diabetes mellitus without complication ([place])   . GERD (gastroesophageal reflux disease)   . Hemorrhoids   . Hypertension   . Hypothyroidism, adult   . Impingement syndrome of right shoulder    [facility] subacromial bursitis and tendinitis right shoulder  . Lumbar spinal stenosis    Dr. Phyllis Ginger  . Lump or mass in breast    Dr. Jamal Collin evaluated, likely lipoma  . Menopause   . Metabolic syndrome   . Mild carpal tunnel syndrome of right wrist   . Obesity (BMI 35.0-39.9 without comorbidity)   . Proteinuria      Vital Signs: BP 134/80   Pulse 75   Temp (!) 97.1 F (36.2 C)   Resp 16   Ht 5\' 6"  (1.676 m)   Wt 190 lb 12.8 oz (86.5 kg)   SpO2 96%   BMI 30.80 kg/m    Review of Systems  Constitutional: Negative for fatigue and fever.  HENT: Negative for congestion,  mouth sores and postnasal drip.   Respiratory: Negative for cough.   Cardiovascular: Negative for chest pain.  Genitourinary: Negative for flank pain.  Skin: Positive for rash.       Rash on right ankle  Psychiatric/Behavioral: Negative.     Physical Exam Vitals and nursing note reviewed.  Constitutional:      General: She is not in acute distress.    Appearance: She is well-developed. She is obese. She is not diaphoretic.  HENT:     Head: Normocephalic and atraumatic.     Mouth/Throat:     Pharynx: No oropharyngeal exudate.  Eyes:     Pupils: Pupils are equal, round, and reactive to light.  Neck:     Thyroid: No thyromegaly.     Vascular: No JVD.     Trachea: No tracheal deviation.  Cardiovascular:      Rate and Rhythm: Normal rate and regular rhythm.     Heart sounds: Normal heart sounds. No murmur heard. No friction rub. No gallop.   Pulmonary:     Effort: Pulmonary effort is normal. No respiratory distress.     Breath sounds: No wheezing or rales.  Chest:     Chest wall: No tenderness.  Abdominal:     General: Bowel sounds are normal.     Palpations: Abdomen is soft.  Musculoskeletal:        General: Normal range of motion.     Cervical back: Normal range of motion and neck supple.  Lymphadenopathy:     Cervical: No cervical adenopathy.  Skin:    General: Skin is warm and dry.     Findings: Rash present.     Comments: Rash on Right ankle with a 4-5 pustules, no underlying erythema or increased warmth, no signs of cellulitis or infection  Neurological:     Mental Status: She is alert and oriented to person, place, and time.     Cranial Nerves: No cranial nerve deficit.  Psychiatric:        Behavior: Behavior normal.        Thought Content: Thought content normal.        Judgment: Judgment normal.       Assessment/Plan: 1. Dermatitis of lower extremity Pt may stop triamcinolone cream if not helping since does not report that skin feels itchy and reports that rash changed with application of cream and became more pustular with use.  Advised patient to continue Benadryl at night and utilize Claritin during the daytime and to monitor closely for any signs of infection, such as oozing, warmth, or redness, or if worsening.  Patient will contact office or return to urgent care/ED if acute worsening.   General Counseling: Judy Graves understanding of the findings of todays visit and agrees with plan of treatment. I have discussed any further diagnostic evaluation that may be needed or ordered today. We also reviewed her medications today. she has been encouraged to call the office with any questions or concerns that should arise related to todays  visit.    Counseling:    No orders of the defined types were placed in this encounter.   No orders of the defined types were placed in this encounter.   Time spent:30 Minutes

## 2020-10-17 ENCOUNTER — Ambulatory Visit: Payer: BLUE CROSS/BLUE SHIELD | Admitting: Dermatology

## 2020-12-21 LAB — COMPREHENSIVE METABOLIC PANEL
ALT: 30 IU/L (ref 0–32)
AST: 20 IU/L (ref 0–40)
Albumin/Globulin Ratio: 1.7 (ref 1.2–2.2)
Albumin: 4.5 g/dL (ref 3.8–4.8)
Alkaline Phosphatase: 84 IU/L (ref 44–121)
BUN/Creatinine Ratio: 16 (ref 12–28)
BUN: 18 mg/dL (ref 8–27)
Bilirubin Total: 0.8 mg/dL (ref 0.0–1.2)
CO2: 30 mmol/L — ABNORMAL HIGH (ref 20–29)
Calcium: 9.8 mg/dL (ref 8.7–10.3)
Chloride: 98 mmol/L (ref 96–106)
Creatinine, Ser: 1.14 mg/dL — ABNORMAL HIGH (ref 0.57–1.00)
Globulin, Total: 2.7 g/dL (ref 1.5–4.5)
Glucose: 142 mg/dL — ABNORMAL HIGH (ref 65–99)
Potassium: 3.9 mmol/L (ref 3.5–5.2)
Sodium: 142 mmol/L (ref 134–144)
Total Protein: 7.2 g/dL (ref 6.0–8.5)
eGFR: 54 mL/min/{1.73_m2} — ABNORMAL LOW (ref >59)

## 2020-12-21 LAB — TSH+FREE T4
Free T4: 1.47 ng/dL (ref 0.82–1.77)
TSH: 1.98 u[IU]/mL (ref 0.450–4.500)

## 2020-12-22 ENCOUNTER — Other Ambulatory Visit: Payer: Self-pay

## 2020-12-22 ENCOUNTER — Encounter: Payer: Self-pay | Admitting: Physician Assistant

## 2020-12-22 ENCOUNTER — Ambulatory Visit: Payer: BLUE CROSS/BLUE SHIELD | Admitting: Physician Assistant

## 2020-12-22 VITALS — BP 130/90 | HR 80 | Temp 97.8°F | Resp 16 | Ht 66.0 in | Wt 191.0 lb

## 2020-12-22 DIAGNOSIS — N289 Disorder of kidney and ureter, unspecified: Secondary | ICD-10-CM | POA: Diagnosis not present

## 2020-12-22 DIAGNOSIS — E038 Other specified hypothyroidism: Secondary | ICD-10-CM | POA: Diagnosis not present

## 2020-12-22 DIAGNOSIS — G4700 Insomnia, unspecified: Secondary | ICD-10-CM

## 2020-12-22 DIAGNOSIS — I10 Essential (primary) hypertension: Secondary | ICD-10-CM

## 2020-12-22 DIAGNOSIS — E1165 Type 2 diabetes mellitus with hyperglycemia: Secondary | ICD-10-CM | POA: Diagnosis not present

## 2020-12-22 LAB — POCT GLYCOSYLATED HEMOGLOBIN (HGB A1C): Hemoglobin A1C: 6.8 % — AB (ref 4.0–5.6)

## 2020-12-22 MED ORDER — LEVOTHYROXINE SODIUM 50 MCG PO TABS
ORAL_TABLET | ORAL | 1 refills | Status: DC
Start: 2020-12-22 — End: 2021-08-04

## 2020-12-22 MED ORDER — METOPROLOL SUCCINATE ER 50 MG PO TB24
50.0000 mg | ORAL_TABLET | Freq: Every day | ORAL | 1 refills | Status: DC
Start: 2020-12-22 — End: 2021-07-25

## 2020-12-22 NOTE — Progress Notes (Signed)
[facility] [place], [place] [address]  Internal MEDICINE  Office Visit Note  Patient Name: Judy Graves  [id number]  [id number]  Date of Service: 12/27/2020  Chief Complaint  Patient presents with   Follow-up   Hypertension   Diabetes    HPI Pt is here for routine follow up -Does not check BG of BP at home -Bp stable in office. -Has been taking her farxiga and tolerates this well, but states she knows she will need to change meds once she switches to medicare next year since it is not covered -Reviewed labs and thyroid normal. Renal function slightly worsening--May need Renal US if renal function abnormal on next labs -Sleep not great, does not get 7 hours most nights. Will try melatonin to aid sleep  Current Medication: Outpatient Encounter Medications as of 12/22/2020  Medication Sig   aspirin (ASPIRIN LOW DOSE) 81 MG EC tablet Take 1 tablet (81 mg total) by mouth daily. Swallow whole.   atorvastatin (LIPITOR) 40 MG tablet Take 1 tablet (40 mg total) by mouth daily.   chlorthalidone (HYGROTON) 25 MG tablet TAKE ONE TABLET BY MOUTH DAILY *INCREASE POTASSIUM INTAKE WITH DIET*   dapagliflozin propanediol (FARXIGA) 10 MG TABS tablet Take 1 tablet (10 mg total) by mouth daily.   EPIPEN 2-PAK 0.3 MG/0.3ML SOAJ injection AS DIRECTED AS DIRECTED INJECTION 2   glucose blood (ACCU-CHEK GUIDE) test strip Use as instructed once daily. Dx e11.65   omeprazole (PRILOSEC) 40 MG capsule TAKE 1 CAPSULE (40 MG TOTAL) BY MOUTH DAILY.   triamcinolone ointment (KENALOG) 0.5 % Apply topically 2 (two) times daily.   [DISCONTINUED] levothyroxine (SYNTHROID) 50 MCG tablet TAKE 1 TABLET DAILY AND 2 TABLETS ON SUNDAYS   [DISCONTINUED] metoprolol succinate (TOPROL-XL) 50 MG 24 hr tablet Take 1 tablet (50 mg total) by mouth daily.   levothyroxine (SYNTHROID) 50 MCG tablet TAKE 1 TABLET DAILY AND 2 TABLETS ON SUNDAYS   metoprolol succinate (TOPROL-XL) 50 MG 24 hr tablet Take 1  tablet (50 mg total) by mouth daily.   No facility-administered encounter medications on file as of 12/22/2020.    Surgical History: Past Surgical History:  Procedure Laterality Date   CARPAL TUNNEL RELEASE Right 2010   Dr. Sabra Heck   CATARACT EXTRACTION W/PHACO Left 11/02/2015   Procedure: CATARACT EXTRACTION PHACO AND INTRAOCULAR LENS PLACEMENT ([place]) left eye;  Surgeon: Leandrew Koyanagi, MD;  Location: [facility];  Service: Ophthalmology;  Laterality: Left;   CHONDROPLASTY Left 11/14/2015   Procedure: CHONDROPLASTY;  Surgeon: Dereck Leep, MD;  Location: [facility];  Service: Orthopedics;  Laterality: Left;   KNEE ARTHROSCOPY WITH LATERAL MENISECTOMY Left 11/14/2015   Procedure: KNEE ARTHROSCOPY WITH LATERAL MENISECTOMY;  Surgeon: Dereck Leep, MD;  Location: [facility];  Service: Orthopedics;  Laterality: Left;   KNEE ARTHROSCOPY WITH MEDIAL MENISECTOMY Left 11/14/2015   Procedure: KNEE ARTHROSCOPY WITH MEDIAL MENISECTOMY;  Surgeon: Dereck Leep, MD;  Location: [facility];  Service: Orthopedics;  Laterality: Left;   TONSILLECTOMY     TUBAL LIGATION      Medical History: Past Medical History:  Diagnosis Date   Allergy    Arthritis    in knee   Chronic constipation    Chronic insomnia    Diabetes mellitus without complication (HCC)    GERD (gastroesophageal reflux disease)    Hemorrhoids    Hypertension    Hypothyroidism, adult    Impingement syndrome of right shoulder    [facility] subacromial bursitis  and tendinitis right shoulder   Lumbar spinal stenosis    Dr. Phyllis Ginger   Lump or mass in breast    Dr. Jamal Collin evaluated, likely lipoma   Menopause    Metabolic syndrome    Mild carpal tunnel syndrome of right wrist    Obesity (BMI 35.0-39.9 without comorbidity)    Proteinuria     Family History: Family History  Problem Relation Age of Onset   Cancer Mother        Breast   Diabetes Mother    Breast cancer Mother    Hypertension Father    CVA Father         30's   Hypertension Sister    Hypertension Brother    Breast cancer Paternal Aunt     Social History   Socioeconomic History   Marital status: Married    Spouse name: Not on file   Number of children: Not on file   Years of education: Not on file   Highest education level: Not on file  Occupational History   Not on file  Tobacco Use   Smoking status: Never   Smokeless tobacco: Never  Substance and Sexual Activity   Alcohol use: No    Alcohol/week: 0.0 standard drinks   Drug use: No   Sexual activity: Yes    Partners: Male  Other Topics Concern   Not on file  Social History Narrative   Not on file   Social Determinants of Health   Financial Resource Strain: Not on file  Food Insecurity: Not on file  Transportation Needs: Not on file  Physical Activity: Not on file  Stress: Not on file  Social Connections: Not on file  Intimate Partner Violence: Not on file      Review of Systems  Constitutional:  Positive for fatigue. Negative for chills and unexpected weight change.  HENT:  Negative for congestion, postnasal drip, rhinorrhea, sneezing and sore throat.   Eyes:  Negative for redness.  Respiratory:  Negative for cough, chest tightness and shortness of breath.   Cardiovascular:  Negative for chest pain and palpitations.  Gastrointestinal:  Negative for abdominal pain, constipation, diarrhea, nausea and vomiting.  Genitourinary:  Negative for dysuria and frequency.  Musculoskeletal:  Negative for arthralgias, back pain, joint swelling and neck pain.  Skin:  Negative for rash.  Neurological: Negative.  Negative for tremors and numbness.  Hematological:  Negative for adenopathy. Does not bruise/bleed easily.  Psychiatric/Behavioral:  Positive for sleep disturbance. Negative for behavioral problems (Depression) and suicidal ideas. The patient is not nervous/anxious.    Vital Signs: BP 130/90   Pulse 80   Temp 97.8 F (36.6 C)   Resp 16   Ht '5\' 6"'$  (1.676  m)   Wt 191 lb (86.6 kg)   SpO2 95%   BMI 30.83 kg/m    Physical Exam Vitals and nursing note reviewed.  Constitutional:      General: She is not in acute distress.    Appearance: She is well-developed. She is obese. She is not diaphoretic.  HENT:     Head: Normocephalic and atraumatic.     Mouth/Throat:     Pharynx: No oropharyngeal exudate.  Eyes:     Pupils: Pupils are equal, round, and reactive to light.  Neck:     Thyroid: No thyromegaly.     Vascular: No JVD.     Trachea: No tracheal deviation.  Cardiovascular:     Rate and Rhythm: Normal rate and regular rhythm.  Heart sounds: Normal heart sounds. No murmur heard.   No friction rub. No gallop.  Pulmonary:     Effort: Pulmonary effort is normal. No respiratory distress.     Breath sounds: No wheezing or rales.  Chest:     Chest wall: No tenderness.  Abdominal:     General: Bowel sounds are normal.     Palpations: Abdomen is soft.  Musculoskeletal:        General: Normal range of motion.     Cervical back: Normal range of motion and neck supple.  Lymphadenopathy:     Cervical: No cervical adenopathy.  Skin:    General: Skin is warm and dry.  Neurological:     Mental Status: She is alert and oriented to person, place, and time.     Cranial Nerves: No cranial nerve deficit.  Psychiatric:        Behavior: Behavior normal.        Thought Content: Thought content normal.        Judgment: Judgment normal.       Assessment/Plan: 1. Type 2 diabetes mellitus with hyperglycemia, without long-term current use of insulin (HCC) - POCT HgB A1C is 6.8 which is increased from 6.5 on last visit.  We will continue with [place] as well as improving diet and exercise.  Patient encouraged to monitor BG  2. Benign hypertension Stable we will continue current medications and advised patient to check BP at home - metoprolol succinate (TOPROL-XL) 50 MG 24 hr tablet; Take 1 tablet (50 mg total) by mouth daily.  Dispense: 90  tablet; Refill: 1  3. Other specified hypothyroidism Labs stable.  We will continue on Synthroid - levothyroxine (SYNTHROID) 50 MCG tablet; TAKE 1 TABLET DAILY AND 2 TABLETS ON SUNDAYS  Dispense: 102 tablet; Refill: 1  4. Abnormal renal function Slightly worsening renal functions we will continue to monitor closely and consider renal ultrasound if not improving  5. Insomnia, unspecified type Discussed sleep hygiene and patient will try melatonin a few hours before bed to see if this improves her sleep   General Counseling: Britni verbalizes understanding of the findings of todays visit and agrees with plan of treatment. I have discussed any further diagnostic evaluation that may be needed or ordered today. We also reviewed her medications today. she has been encouraged to call the office with any questions or concerns that should arise related to todays visit.    Orders Placed This Encounter  Procedures   POCT HgB A1C    Meds ordered this encounter  Medications   metoprolol succinate (TOPROL-XL) 50 MG 24 hr tablet    Sig: Take 1 tablet (50 mg total) by mouth daily.    Dispense:  90 tablet    Refill:  1   levothyroxine (SYNTHROID) 50 MCG tablet    Sig: TAKE 1 TABLET DAILY AND 2 TABLETS ON SUNDAYS    Dispense:  102 tablet    Refill:  1    This patient was seen by Drema Dallas, PA-C in collaboration with Dr. Clayborn Bigness as a part of collaborative care agreement.   Total time spent:35 Minutes Time spent includes review of chart, medications, test results, and follow up plan with the patient.      Dr Lavera Guise Internal medicine

## 2020-12-29 ENCOUNTER — Ambulatory Visit: Payer: BLUE CROSS/BLUE SHIELD | Admitting: Physician Assistant

## 2021-01-18 ENCOUNTER — Telehealth: Payer: Self-pay

## 2021-01-18 NOTE — Telephone Encounter (Signed)
Spoke to pt, pt did not want to do colonoscopy or colorgaurd. Pt aware cologuard order has expired.

## 2021-02-22 ENCOUNTER — Ambulatory Visit (INDEPENDENT_AMBULATORY_CARE_PROVIDER_SITE_OTHER): Payer: BLUE CROSS/BLUE SHIELD

## 2021-02-22 ENCOUNTER — Other Ambulatory Visit: Payer: Self-pay

## 2021-02-22 DIAGNOSIS — Z23 Encounter for immunization: Secondary | ICD-10-CM | POA: Diagnosis not present

## 2021-03-23 ENCOUNTER — Ambulatory Visit: Payer: BLUE CROSS/BLUE SHIELD | Admitting: Physician Assistant

## 2021-03-23 ENCOUNTER — Encounter: Payer: Self-pay | Admitting: Physician Assistant

## 2021-03-23 ENCOUNTER — Other Ambulatory Visit: Payer: Self-pay

## 2021-03-23 VITALS — BP 140/73 | HR 77 | Temp 98.0°F | Resp 16 | Ht 64.0 in | Wt 193.0 lb

## 2021-03-23 DIAGNOSIS — R5383 Other fatigue: Secondary | ICD-10-CM

## 2021-03-23 DIAGNOSIS — E785 Hyperlipidemia, unspecified: Secondary | ICD-10-CM

## 2021-03-23 DIAGNOSIS — E1165 Type 2 diabetes mellitus with hyperglycemia: Secondary | ICD-10-CM | POA: Diagnosis not present

## 2021-03-23 DIAGNOSIS — E538 Deficiency of other specified B group vitamins: Secondary | ICD-10-CM

## 2021-03-23 DIAGNOSIS — M25532 Pain in left wrist: Secondary | ICD-10-CM

## 2021-03-23 DIAGNOSIS — E559 Vitamin D deficiency, unspecified: Secondary | ICD-10-CM

## 2021-03-23 DIAGNOSIS — E039 Hypothyroidism, unspecified: Secondary | ICD-10-CM

## 2021-03-23 DIAGNOSIS — N289 Disorder of kidney and ureter, unspecified: Secondary | ICD-10-CM

## 2021-03-23 LAB — POCT GLYCOSYLATED HEMOGLOBIN (HGB A1C): Hemoglobin A1C: 6.7 % — AB (ref 4.0–5.6)

## 2021-03-23 NOTE — Progress Notes (Signed)
[facility] [place], [place] [address]  Internal MEDICINE  Office Visit Note  Patient Name: Judy Graves  [id number]  [id number]  Date of Service: 03/28/2021  Chief Complaint  Patient presents with   Follow-up   Diabetes   Hypertension    HPI Pt is here for routine follow up -Sleep has been ok without needing to take melatonin -Does not check BP or BG at home -Will recheck kidney function and have patient go ahead do all labs since CPE upcoming next month. If kidney function still abnormal will consider renal US -Does have some left wrist pain recently and was concerned it looked swollen but on exam it is not tender anywhere or swollen therefore will have her take tylenol as needed and f/u if not improving  Current Medication: Outpatient Encounter Medications as of 03/23/2021  Medication Sig   aspirin (ASPIRIN LOW DOSE) 81 MG EC tablet Take 1 tablet (81 mg total) by mouth daily. Swallow whole.   atorvastatin (LIPITOR) 40 MG tablet Take 1 tablet (40 mg total) by mouth daily.   chlorthalidone (HYGROTON) 25 MG tablet TAKE ONE TABLET BY MOUTH DAILY *INCREASE POTASSIUM INTAKE WITH DIET*   dapagliflozin propanediol (FARXIGA) 10 MG TABS tablet Take 1 tablet (10 mg total) by mouth daily.   EPIPEN 2-PAK 0.3 MG/0.3ML SOAJ injection AS DIRECTED AS DIRECTED INJECTION 2   glucose blood (ACCU-CHEK GUIDE) test strip Use as instructed once daily. Dx e11.65   levothyroxine (SYNTHROID) 50 MCG tablet TAKE 1 TABLET DAILY AND 2 TABLETS ON SUNDAYS   metoprolol succinate (TOPROL-XL) 50 MG 24 hr tablet Take 1 tablet (50 mg total) by mouth daily.   omeprazole (PRILOSEC) 40 MG capsule TAKE 1 CAPSULE (40 MG TOTAL) BY MOUTH DAILY.   [DISCONTINUED] triamcinolone ointment (KENALOG) 0.5 % Apply topically 2 (two) times daily. (Patient not taking: Reported on 03/23/2021)   No facility-administered encounter medications on file as of 03/23/2021.    Surgical History: Past Surgical  History:  Procedure Laterality Date   CARPAL TUNNEL RELEASE Right 2010   Dr. Sabra Heck   CATARACT EXTRACTION W/PHACO Left 11/02/2015   Procedure: CATARACT EXTRACTION PHACO AND INTRAOCULAR LENS PLACEMENT ([place]) left eye;  Surgeon: Leandrew Koyanagi, MD;  Location: [facility];  Service: Ophthalmology;  Laterality: Left;   CHONDROPLASTY Left 11/14/2015   Procedure: CHONDROPLASTY;  Surgeon: Dereck Leep, MD;  Location: [facility];  Service: Orthopedics;  Laterality: Left;   KNEE ARTHROSCOPY WITH LATERAL MENISECTOMY Left 11/14/2015   Procedure: KNEE ARTHROSCOPY WITH LATERAL MENISECTOMY;  Surgeon: Dereck Leep, MD;  Location: [facility];  Service: Orthopedics;  Laterality: Left;   KNEE ARTHROSCOPY WITH MEDIAL MENISECTOMY Left 11/14/2015   Procedure: KNEE ARTHROSCOPY WITH MEDIAL MENISECTOMY;  Surgeon: Dereck Leep, MD;  Location: [facility];  Service: Orthopedics;  Laterality: Left;   TONSILLECTOMY     TUBAL LIGATION      Medical History: Past Medical History:  Diagnosis Date   Allergy    Arthritis    in knee   Chronic constipation    Chronic insomnia    Diabetes mellitus without complication (HCC)    GERD (gastroesophageal reflux disease)    Hemorrhoids    Hypertension    Hypothyroidism, adult    Impingement syndrome of right shoulder     [facility] subacromial bursitis and tendinitis right shoulder   Lumbar spinal stenosis    Dr. Phyllis Ginger   Lump or mass in breast    Dr. Jamal Collin evaluated, likely lipoma  Menopause    Metabolic syndrome    Mild carpal tunnel syndrome of right wrist    Obesity (BMI 35.0-39.9 without comorbidity)    Proteinuria     Family History: Family History  Problem Relation Age of Onset   Cancer Mother        Breast   Diabetes Mother    Breast cancer Mother    Hypertension Father    CVA Father        26's   Hypertension Sister    Hypertension Brother    Breast cancer Paternal Aunt     Social History   Socioeconomic History   Marital  status: Married    Spouse name: Not on file   Number of children: Not on file   Years of education: Not on file   Highest education level: Not on file  Occupational History   Not on file  Tobacco Use   Smoking status: Never   Smokeless tobacco: Never  Substance and Sexual Activity   Alcohol use: No    Alcohol/week: 0.0 standard drinks   Drug use: No   Sexual activity: Yes    Partners: Male  Other Topics Concern   Not on file  Social History Narrative   Not on file   Social Determinants of Health   Financial Resource Strain: Not on file  Food Insecurity: Not on file  Transportation Needs: Not on file  Physical Activity: Not on file  Stress: Not on file  Social Connections: Not on file  Intimate Partner Violence: Not on file      Review of Systems  Constitutional:  Negative for chills, diaphoresis and fatigue.  HENT:  Negative for ear pain, postnasal drip and sinus pressure.   Eyes:  Negative for photophobia, discharge, redness, itching and visual disturbance.  Respiratory:  Negative for cough, shortness of breath and wheezing.   Cardiovascular:  Negative for chest pain, palpitations and leg swelling.  Gastrointestinal:  Negative for abdominal pain, constipation, diarrhea, nausea and vomiting.  Genitourinary:  Negative for dysuria and flank pain.  Musculoskeletal:  Positive for arthralgias. Negative for back pain, gait problem and neck pain.       Left wrist pain  Skin:  Negative for color change.  Allergic/Immunologic: Negative for environmental allergies and food allergies.  Neurological:  Negative for dizziness and headaches.  Hematological:  Does not bruise/bleed easily.  Psychiatric/Behavioral:  Negative for agitation, behavioral problems (depression) and hallucinations.    Vital Signs: BP 140/73   Pulse 77   Temp 98 F (36.7 C)   Resp 16   Ht 5\' 4"  (1.626 m)   Wt 193 lb (87.5 kg)   SpO2 96%   BMI 33.13 kg/m    Physical Exam Vitals and nursing note  reviewed.  Constitutional:      General: She is not in acute distress.    Appearance: She is well-developed. She is obese. She is not diaphoretic.  HENT:     Head: Normocephalic and atraumatic.     Mouth/Throat:     Pharynx: No oropharyngeal exudate.  Eyes:     Pupils: Pupils are equal, round, and reactive to light.  Neck:     Thyroid: No thyromegaly.     Vascular: No JVD.     Trachea: No tracheal deviation.  Cardiovascular:     Rate and Rhythm: Normal rate and regular rhythm.     Heart sounds: Normal heart sounds. No murmur heard.   No friction rub. No gallop.  Pulmonary:  Effort: Pulmonary effort is normal. No respiratory distress.     Breath sounds: No wheezing or rales.  Chest:     Chest wall: No tenderness.  Abdominal:     General: Bowel sounds are normal.     Palpations: Abdomen is soft.  Musculoskeletal:        General: No swelling or tenderness. Normal range of motion.     Cervical back: Normal range of motion and neck supple.     Comments: Left wrist non TTP and no obvious swelling or deformity  Lymphadenopathy:     Cervical: No cervical adenopathy.  Skin:    General: Skin is warm and dry.  Neurological:     Mental Status: She is alert and oriented to person, place, and time.     Cranial Nerves: No cranial nerve deficit.  Psychiatric:        Behavior: Behavior normal.        Thought Content: Thought content normal.        Judgment: Judgment normal.       Assessment/Plan: 1. Type 2 diabetes mellitus with hyperglycemia, without long-term current use of insulin (HCC) - POCT HgB A1C is 6.7 which is down slightly from 6.8 last check. Will continue [place] and pt advised to call insurance to ask what is covered on ce it switches at the start of the year.  2. Abnormal renal function Will recheck labs and consider renal US if not improved - Comprehensive metabolic panel  3. B12 deficiency - B12 and Folate Panel  4. Vitamin D deficiency - VITAMIN D 25  Hydroxy (Vit-D Deficiency, Fractures)  5. Hyperlipidemia, unspecified hyperlipidemia type - Lipid Panel With LDL/HDL Ratio  6. Left wrist pain May take tylenol as needed and will call if not improving  7. Other fatigue - CBC w/Diff/Platelet - TSH + free T4  8. Hypothyroidism, unspecified type Will recheck labs and continue synthroid as before  General Counseling: Madgeline verbalizes understanding of the findings of todays visit and agrees with plan of treatment. I have discussed any further diagnostic evaluation that may be needed or ordered today. We also reviewed her medications today. she has been encouraged to call the office with any questions or concerns that should arise related to todays visit.    Orders Placed This Encounter  Procedures   CBC w/Diff/Platelet   TSH + free T4   Lipid Panel With LDL/HDL Ratio   B12 and Folate Panel   VITAMIN D 25 Hydroxy (Vit-D Deficiency, Fractures)   Comprehensive metabolic panel   POCT HgB A1C    No orders of the defined types were placed in this encounter.   This patient was seen by Drema Dallas, PA-C in collaboration with Dr. Clay[place] Bigness as a part of collaborative care agreement.   Total time spent:30 Minutes Time spent includes review of chart, medications, test results, and follow up plan with the patient.      Dr Lavera Guise Internal medicine

## 2021-04-11 LAB — TSH+FREE T4
Free T4: 1.44 ng/dL (ref 0.82–1.77)
TSH: 2.59 u[IU]/mL (ref 0.450–4.500)

## 2021-04-11 LAB — VITAMIN D 25 HYDROXY (VIT D DEFICIENCY, FRACTURES): Vit D, 25-Hydroxy: 15.6 ng/mL — ABNORMAL LOW (ref 30.0–100.0)

## 2021-04-11 LAB — LIPID PANEL WITH LDL/HDL RATIO
Cholesterol, Total: 166 mg/dL (ref 100–199)
HDL: 46 mg/dL (ref >39)
LDL Chol Calc (NIH): 100 mg/dL — ABNORMAL HIGH (ref 0–99)
LDL/HDL Ratio: 2.2 ratio (ref 0.0–3.2)
Triglycerides: 110 mg/dL (ref 0–149)
VLDL Cholesterol Cal: 20 mg/dL (ref 5–40)

## 2021-04-11 LAB — CBC WITH DIFFERENTIAL/PLATELET
Basophils Absolute: 0 10*3/uL (ref 0.0–0.2)
Basos: 0 %
EOS (ABSOLUTE): 0.1 10*3/uL (ref 0.0–0.4)
Eos: 1 %
Hematocrit: 47 % — ABNORMAL HIGH (ref 34.0–46.6)
Hemoglobin: 16.4 g/dL — ABNORMAL HIGH (ref 11.1–15.9)
Immature Grans (Abs): 0 10*3/uL (ref 0.0–0.1)
Immature Granulocytes: 0 %
Lymphocytes Absolute: 4 10*3/uL — ABNORMAL HIGH (ref 0.7–3.1)
Lymphs: 46 %
MCH: 31.8 pg (ref 26.6–33.0)
MCHC: 34.9 g/dL (ref 31.5–35.7)
MCV: 91 fL (ref 79–97)
Monocytes Absolute: 0.8 10*3/uL (ref 0.1–0.9)
Monocytes: 9 %
Neutrophils Absolute: 3.8 10*3/uL (ref 1.4–7.0)
Neutrophils: 44 %
Platelets: 220 10*3/uL (ref 150–450)
RBC: 5.16 x10E6/uL (ref 3.77–5.28)
RDW: 12.4 % (ref 11.7–15.4)
WBC: 8.6 10*3/uL (ref 3.4–10.8)

## 2021-04-11 LAB — COMPREHENSIVE METABOLIC PANEL
ALT: 28 IU/L (ref 0–32)
AST: 17 IU/L (ref 0–40)
Albumin/Globulin Ratio: 1.6 (ref 1.2–2.2)
Albumin: 4.5 g/dL (ref 3.8–4.8)
Alkaline Phosphatase: 87 IU/L (ref 44–121)
BUN/Creatinine Ratio: 18 (ref 12–28)
BUN: 20 mg/dL (ref 8–27)
Bilirubin Total: 0.7 mg/dL (ref 0.0–1.2)
CO2: 25 mmol/L (ref 20–29)
Calcium: 10 mg/dL (ref 8.7–10.3)
Chloride: 96 mmol/L (ref 96–106)
Creatinine, Ser: 1.12 mg/dL — ABNORMAL HIGH (ref 0.57–1.00)
Globulin, Total: 2.8 g/dL (ref 1.5–4.5)
Glucose: 155 mg/dL — ABNORMAL HIGH (ref 70–99)
Potassium: 3.7 mmol/L (ref 3.5–5.2)
Sodium: 142 mmol/L (ref 134–144)
Total Protein: 7.3 g/dL (ref 6.0–8.5)
eGFR: 55 mL/min/{1.73_m2} — ABNORMAL LOW (ref >59)

## 2021-04-11 LAB — B12 AND FOLATE PANEL
Folate: 13.2 ng/mL (ref >3.0)
Vitamin B-12: 387 pg/mL (ref 232–1245)

## 2021-05-03 ENCOUNTER — Encounter: Payer: BLUE CROSS/BLUE SHIELD | Admitting: Nurse Practitioner

## 2021-05-05 ENCOUNTER — Ambulatory Visit (INDEPENDENT_AMBULATORY_CARE_PROVIDER_SITE_OTHER): Payer: BLUE CROSS/BLUE SHIELD | Admitting: Physician Assistant

## 2021-05-05 ENCOUNTER — Other Ambulatory Visit: Payer: Self-pay

## 2021-05-05 ENCOUNTER — Encounter: Payer: Self-pay | Admitting: Physician Assistant

## 2021-05-05 DIAGNOSIS — D582 Other hemoglobinopathies: Secondary | ICD-10-CM

## 2021-05-05 DIAGNOSIS — E1165 Type 2 diabetes mellitus with hyperglycemia: Secondary | ICD-10-CM | POA: Diagnosis not present

## 2021-05-05 DIAGNOSIS — I10 Essential (primary) hypertension: Secondary | ICD-10-CM | POA: Diagnosis not present

## 2021-05-05 DIAGNOSIS — E559 Vitamin D deficiency, unspecified: Secondary | ICD-10-CM

## 2021-05-05 DIAGNOSIS — Z01419 Encounter for gynecological examination (general) (routine) without abnormal findings: Secondary | ICD-10-CM

## 2021-05-05 DIAGNOSIS — E039 Hypothyroidism, unspecified: Secondary | ICD-10-CM | POA: Diagnosis not present

## 2021-05-05 DIAGNOSIS — Z1231 Encounter for screening mammogram for malignant neoplasm of breast: Secondary | ICD-10-CM

## 2021-05-05 DIAGNOSIS — Z0001 Encounter for general adult medical examination with abnormal findings: Secondary | ICD-10-CM

## 2021-05-05 DIAGNOSIS — R300 Dysuria: Secondary | ICD-10-CM

## 2021-05-05 DIAGNOSIS — Z1212 Encounter for screening for malignant neoplasm of rectum: Secondary | ICD-10-CM

## 2021-05-05 DIAGNOSIS — Z1211 Encounter for screening for malignant neoplasm of colon: Secondary | ICD-10-CM

## 2021-05-05 DIAGNOSIS — Z794 Long term (current) use of insulin: Secondary | ICD-10-CM

## 2021-05-05 MED ORDER — ZOSTER VAC RECOMB ADJUVANTED 50 MCG/0.5ML IM SUSR: 0.5000 mL | Freq: Once | INTRAMUSCULAR | 0 refills | Status: AC

## 2021-05-05 MED ORDER — TETANUS-DIPHTH-ACELL PERTUSSIS 5-2.5-18.5 LF-MCG/0.5 IM SUSY: 0.5000 mL | PREFILLED_SYRINGE | Freq: Once | INTRAMUSCULAR | 0 refills | Status: AC

## 2021-05-05 MED ORDER — ERGOCALCIFEROL 1.25 MG (50000 UT) PO CAPS: ORAL_CAPSULE | ORAL | 3 refills | Status: DC

## 2021-05-05 NOTE — Progress Notes (Signed)
[facility] [place], [place] [address]  Internal MEDICINE  Office Visit Note  Patient Name: Judy Graves  [id number]  [id number]  Date of Service: 05/05/2021  Chief Complaint  Patient presents with   Annual Exam   Diabetes   Quality Metric Gaps    colonoscopy     HPI Pt is here for routine health maintenance examination -146 fasting glucose one morning, but did not check BG other days. Last A1c looked good and too soon to repeat at this time. Does mention Rosette Reveal be covered after Jan 1st and was instructed to call insurance for preferred med and let us know which med is preferred when the time comes to refill. -BP not checked at home, borderline elevated in office and will start checking now -Reviewed labs which show low B12 and low vit D. Will try oral B12 supplement as she does not want to do shots and will send drisdol to be taken weekly. Additionally discussed her hemoglobin is elevated and that this can be a sign of low oxygen. Pt denies gasping awake or snoring and states she feels well rested. She does not want to have a sleep study as she feels she sleeps very well and gets claustrophobic if anything is on her face. Discussed alternative overnight pulseox study, but pt declines this as well and states she could not even tolerate oxygen via [employer] if it were found to be indicated. Discussed we will need to continue to monitor and reconsider. -Additionally her renal function is still slightly reduced but overall stable and even slightly improved from prior labs. Discussed considering a renal US, however will continue to monitor for now and obtain US if any further changes. -Due for cologuard and mammogram which will be ordered today -Will send shingles and tdap vaccines to pharmacy  Current Medication: Outpatient Encounter Medications as of 05/05/2021  Medication Sig   aspirin (ASPIRIN LOW DOSE) 81 MG EC tablet Take 1 tablet (81 mg total) by mouth  daily. Swallow whole.   atorvastatin (LIPITOR) 40 MG tablet Take 1 tablet (40 mg total) by mouth daily.   chlorthalidone (HYGROTON) 25 MG tablet TAKE ONE TABLET BY MOUTH DAILY *INCREASE POTASSIUM INTAKE WITH DIET*   dapagliflozin propanediol (FARXIGA) 10 MG TABS tablet Take 1 tablet (10 mg total) by mouth daily.   EPIPEN 2-PAK 0.3 MG/0.3ML SOAJ injection AS DIRECTED AS DIRECTED INJECTION 2   ergocalciferol (DRISDOL) 1.25 MG (50000 UT) capsule Take one cap q week   glucose blood (ACCU-CHEK GUIDE) test strip Use as instructed once daily. Dx e11.65   levothyroxine (SYNTHROID) 50 MCG tablet TAKE 1 TABLET DAILY AND 2 TABLETS ON SUNDAYS   metoprolol succinate (TOPROL-XL) 50 MG 24 hr tablet Take 1 tablet (50 mg total) by mouth daily.   omeprazole (PRILOSEC) 40 MG capsule TAKE 1 CAPSULE (40 MG TOTAL) BY MOUTH DAILY.   [DISCONTINUED] Tdap (BOOSTRIX) 5-2.5-18.5 LF-MCG/0.5 injection Inject 0.5 mLs into the muscle once.   [DISCONTINUED] Zoster Vaccine Adjuvanted [facility]) injection Inject 0.5 mLs into the muscle once.   Tdap (BOOSTRIX) 5-2.5-18.5 LF-MCG/0.5 injection Inject 0.5 mLs into the muscle once for 1 dose.   Zoster Vaccine Adjuvanted [facility]) injection Inject 0.5 mLs into the muscle once for 1 dose.   No facility-administered encounter medications on file as of 05/05/2021.    Surgical History: Past Surgical History:  Procedure Laterality Date   CARPAL TUNNEL RELEASE Right 2010   Dr. Sabra Heck   CATARACT EXTRACTION [facility] Left 11/02/2015  Procedure: CATARACT EXTRACTION PHACO AND INTRAOCULAR LENS PLACEMENT ([place]) left eye;  Surgeon: Leandrew Koyanagi, MD;  Location: [place];  Service: Ophthalmology;  Laterality: Left;   CHONDROPLASTY Left 11/14/2015   Procedure: CHONDROPLASTY;  Surgeon: Dereck Leep, MD;  Location: [facility];  Service: Orthopedics;  Laterality: Left;   KNEE ARTHROSCOPY WITH LATERAL MENISECTOMY Left 11/14/2015   Procedure: KNEE ARTHROSCOPY WITH LATERAL MENISECTOMY;   Surgeon: Dereck Leep, MD;  Location: [facility];  Service: Orthopedics;  Laterality: Left;   KNEE ARTHROSCOPY WITH MEDIAL MENISECTOMY Left 11/14/2015   Procedure: KNEE ARTHROSCOPY WITH MEDIAL MENISECTOMY;  Surgeon: Dereck Leep, MD;  Location: [facility];  Service: Orthopedics;  Laterality: Left;   TONSILLECTOMY     TUBAL LIGATION      Medical History: Past Medical History:  Diagnosis Date   Allergy    Arthritis    in knee   Chronic constipation    Chronic insomnia    Diabetes mellitus without complication (HCC)    GERD (gastroesophageal reflux disease)    Hemorrhoids    Hypertension    Hypothyroidism, adult    Impingement syndrome of right shoulder    [facility] subacromial bursitis and tendinitis right shoulder   Lumbar spinal stenosis    Dr. Phyllis Ginger   Lump or mass in breast    Dr. Jamal Collin evaluated, likely lipoma   Menopause    Metabolic syndrome    Mild carpal tunnel syndrome of right wrist    Obesity (BMI 35.0-39.9 without comorbidity)    Proteinuria     Family History: Family History  Problem Relation Age of Onset   Cancer Mother        Breast   Diabetes Mother    Breast cancer Mother    Hypertension Father    CVA Father        6's   Hypertension Sister    Hypertension Brother    Breast cancer Paternal Aunt       Review of Systems  Constitutional:  Negative for chills, diaphoresis and fatigue.  HENT:  Negative for ear pain, postnasal drip and sinus pressure.   Eyes:  Negative for photophobia, discharge, redness, itching and visual disturbance.  Respiratory:  Negative for cough, shortness of breath and wheezing.   Cardiovascular:  Negative for chest pain, palpitations and leg swelling.  Gastrointestinal:  Negative for abdominal pain, constipation, diarrhea, nausea and vomiting.  Genitourinary:  Negative for dysuria and flank pain.  Musculoskeletal:  Negative for arthralgias, back pain, gait problem and neck pain.  Skin:  Negative for color  change.  Allergic/Immunologic: Negative for environmental allergies and food allergies.  Neurological:  Negative for dizziness and headaches.  Hematological:  Does not bruise/bleed easily.  Psychiatric/Behavioral:  Negative for agitation, behavioral problems (depression) and hallucinations.     Vital Signs: BP (!) 142/72    Pulse 71    Temp 97.9 F (36.6 C)    Resp 16    Ht 5' 6"  (1.676 m)    Wt 188 lb 12.8 oz (85.6 kg)    SpO2 97%    BMI 30.47 kg/m    Physical Exam Vitals and nursing note reviewed.  Constitutional:      General: She is not in acute distress.    Appearance: She is well-developed. She is obese. She is not diaphoretic.  HENT:     Head: Normocephalic and atraumatic.     Mouth/Throat:     Pharynx: No oropharyngeal exudate.  Eyes:  Pupils: Pupils are equal, round, and reactive to light.  Neck:     Thyroid: No thyromegaly.     Vascular: No JVD.     Trachea: No tracheal deviation.  Cardiovascular:     Rate and Rhythm: Normal rate and regular rhythm.     Pulses:          Dorsalis pedis pulses are 3+ on the right side and 3+ on the left side.       Posterior tibial pulses are 3+ on the right side and 3+ on the left side.     Heart sounds: Normal heart sounds. No murmur heard.   No friction rub. No gallop.  Pulmonary:     Effort: Pulmonary effort is normal. No respiratory distress.     Breath sounds: No wheezing or rales.  Chest:     Chest wall: No tenderness.  Breasts:    Right: Normal. No mass.     Left: Normal. No mass.  Abdominal:     General: Bowel sounds are normal.     Palpations: Abdomen is soft.     Tenderness: There is no abdominal tenderness.  Musculoskeletal:        General: Normal range of motion.     Cervical back: Normal range of motion and neck supple.     Right foot: Normal range of motion.     Left foot: Normal range of motion.  Feet:     Right foot:     Protective Sensation: 2 sites tested.  2 sites sensed.     Skin integrity: Skin  integrity normal.     Toenail Condition: Right toenails are abnormally thick.     Left foot:     Protective Sensation: 2 sites tested.  2 sites sensed.     Skin integrity: Skin integrity normal.     Toenail Condition: Left toenails are abnormally thick.  Lymphadenopathy:     Cervical: No cervical adenopathy.  Skin:    General: Skin is warm and dry.  Neurological:     Mental Status: She is alert and oriented to person, place, and time.     Cranial Nerves: No cranial nerve deficit.  Psychiatric:        Behavior: Behavior normal.        Thought Content: Thought content normal.        Judgment: Judgment normal.     LABS: Recent Results (from the past 2160 hour(s))  POCT HgB A1C     Status: Abnormal   Collection Time: 03/23/21 12:02 PM  Result Value Ref Range   Hemoglobin A1C 6.7 (A) 4.0 - 5.6 %   HbA1c POC (<> result, manual entry)     HbA1c, POC (prediabetic range)     HbA1c, POC (controlled diabetic range)    CBC w/Diff/Platelet     Status: Abnormal   Collection Time: 04/10/21 11:39 AM  Result Value Ref Range   WBC 8.6 3.4 - 10.8 x10E3/uL   RBC 5.16 3.77 - 5.28 x10E6/uL   Hemoglobin 16.4 (H) 11.1 - 15.9 g/dL   Hematocrit 47.0 (H) 34.0 - 46.6 %   MCV 91 79 - 97 fL   MCH 31.8 26.6 - 33.0 pg   MCHC 34.9 31.5 - 35.7 g/dL   RDW 12.4 11.7 - 15.4 %   Platelets 220 150 - 450 x10E3/uL   Neutrophils 44 Not Estab. %   Lymphs 46 Not Estab. %   Monocytes 9 Not Estab. %   Eos 1 Not Estab. %  Basos 0 Not Estab. %   Neutrophils Absolute 3.8 1.4 - 7.0 x10E3/uL   Lymphocytes Absolute 4.0 (H) 0.7 - 3.1 x10E3/uL   Monocytes Absolute 0.8 0.1 - 0.9 x10E3/uL   EOS (ABSOLUTE) 0.1 0.0 - 0.4 x10E3/uL   Basophils Absolute 0.0 0.0 - 0.2 x10E3/uL   Immature Granulocytes 0 Not Estab. %   Immature Grans (Abs) 0.0 0.0 - 0.1 x10E3/uL  TSH + free T4     Status: None   Collection Time: 04/10/21 11:39 AM  Result Value Ref Range   TSH 2.590 0.450 - 4.500 uIU/mL   Free T4 1.44 0.82 - 1.77 ng/dL   Lipid Panel With LDL/HDL Ratio     Status: Abnormal   Collection Time: 04/10/21 11:39 AM  Result Value Ref Range   Cholesterol, Total 166 100 - 199 mg/dL   Triglycerides 110 0 - 149 mg/dL   HDL 46 >39 mg/dL   VLDL Cholesterol Cal 20 5 - 40 mg/dL   LDL Chol Calc (NIH) 100 (H) 0 - 99 mg/dL   LDL/HDL Ratio 2.2 0.0 - 3.2 ratio    Comment:                                     LDL/HDL Ratio                                             Men  Women                               1/2 Avg.Risk  1.0    1.5                                   Avg.Risk  3.6    3.2                                2X Avg.Risk  6.2    5.0                                3X Avg.Risk  8.0    6.1   B12 and Folate Panel     Status: None   Collection Time: 04/10/21 11:39 AM  Result Value Ref Range   Vitamin B-12 387 232 - 1,245 pg/mL   Folate 13.2 >3.0 ng/mL    Comment: A serum folate concentration of less than 3.1 ng/mL is considered to represent clinical deficiency.   VITAMIN D 25 Hydroxy (Vit-D Deficiency, Fractures)     Status: Abnormal   Collection Time: 04/10/21 11:39 AM  Result Value Ref Range   Vit D, 25-Hydroxy 15.6 (L) 30.0 - 100.0 ng/mL    Comment: Vitamin D deficiency has been defined by the [place] practice guideline as a level of serum 25-OH vitamin D less than 20 ng/mL (1,2). The Endocrine Society went on to further define vitamin D insufficiency as a level between 21 and 29 ng/mL (2). 1. IOM (Institute of Medicine). 2010. Dietary reference    intakes for calcium and D.  [place]: The    [employer]. 2. Holick MF, Binkley [place], Bischoff-Ferrari HA, et al.    Evaluation, treatment, and prevention of vitamin D    deficiency: an Endocrine Society clinical practice    guideline. JCEM. 2011 Jul; 96(7):1911-30.   Comprehensive metabolic panel     Status: Abnormal   Collection Time: 04/10/21 11:39 AM  Result Value Ref Range   Glucose 155 (H) 70 - 99 mg/dL    BUN 20 8 - 27 mg/dL   Creatinine, Ser 1.12 (H) 0.57 - 1.00 mg/dL   eGFR 55 (L) >59 mL/min/1.73   BUN/Creatinine Ratio 18 12 - 28   Sodium 142 134 - 144 mmol/L   Potassium 3.7 3.5 - 5.2 mmol/L   Chloride 96 96 - 106 mmol/L   CO2 25 20 - 29 mmol/L   Calcium 10.0 8.7 - 10.3 mg/dL   Total Protein 7.3 6.0 - 8.5 g/dL   Albumin 4.5 3.8 - 4.8 g/dL   Globulin, Total 2.8 1.5 - 4.5 g/dL   Albumin/Globulin Ratio 1.6 1.2 - 2.2   Bilirubin Total 0.7 0.0 - 1.2 mg/dL   Alkaline Phosphatase 87 44 - 121 IU/L   AST 17 0 - 40 IU/L   ALT 28 0 - 32 IU/L        Assessment/Plan: 1. Encounter for general adult medical examination with abnormal findings CPE performed, labs reviewed, mammogram and cologuard ordered and foot exam performed  2. Benign hypertension Borderline elevated in office, will continue current medications and have her start monitoring at home.  3. Type 2 diabetes mellitus with hyperglycemia, with long-term current use of insulin ([place]) Continue [place] for now and contact insurance once it changes to find preferred med--will let us know which is preferred to replace farxiga  4. Elevated hemoglobin (HCC) Discussed considering sleep study or ONO, however pt declines at this time. Will need to monitor closely  5. Hypothyroidism, unspecified type Stable, continue synthroid as before  6. Vitamin D deficiency - ergocalciferol (DRISDOL) 1.25 MG (50000 UT) capsule; Take one cap q week  Dispense: 12 capsule; Refill: 3  7. Visit for gynecologic examination Breast exam performed  8. Screening for colorectal cancer - Cologuard  9. Encounter for screening mammogram for breast cancer - MM 3D SCREEN BREAST BILATERAL; Future  10. Dysuria - UA/M w/rflx Culture, Routine   General Counseling: Jerline verbalizes understanding of the findings of todays visit and agrees with plan of treatment. I have discussed any further diagnostic evaluation that may be needed or ordered today. We also  reviewed her medications today. she has been encouraged to call the office with any questions or concerns that should arise related to todays visit.    Counseling:    Orders Placed This Encounter  Procedures   MM 3D SCREEN BREAST BILATERAL   UA/M w/rflx Culture, Routine   Cologuard    Meds ordered this encounter  Medications   ergocalciferol (DRISDOL) 1.25 MG (50000 UT) capsule    Sig: Take one cap q week    Dispense:  12 capsule    Refill:  3   Tdap (BOOSTRIX) 5-2.5-18.5 LF-MCG/0.5 injection    Sig: Inject 0.5 mLs into the muscle once for 1 dose.    Dispense:  0.5 mL    Refill:  0   Zoster Vaccine Adjuvanted [facility]) injection    Sig: Inject 0.5 mLs into the muscle once for 1 dose.    Dispense:  0.5 mL    Refill:  0  This patient was seen by Drema Dallas, PA-C in collaboration with Dr. Clayborn Bigness as a part of collaborative care agreement.  Total time spent:35 Minutes  Time spent includes review of chart, medications, test results, and follow up plan with the patient.     Lavera Guise, MD  Internal Medicine

## 2021-05-06 LAB — UA/M W/RFLX CULTURE, ROUTINE
Bilirubin, UA: NEGATIVE
Leukocytes,UA: NEGATIVE
Nitrite, UA: NEGATIVE
RBC, UA: NEGATIVE
Specific Gravity, UA: >=1.03 — AB (ref 1.005–1.030)
Urobilinogen, Ur: 1 mg/dL (ref 0.2–1.0)
pH, UA: 6 (ref 5.0–7.5)

## 2021-05-06 LAB — MICROSCOPIC EXAMINATION: Epithelial Cells (non renal): >10 /hpf — AB (ref 0–10)

## 2021-05-16 DIAGNOSIS — Z1212 Encounter for screening for malignant neoplasm of rectum: Secondary | ICD-10-CM | POA: Diagnosis not present

## 2021-05-16 DIAGNOSIS — Z1211 Encounter for screening for malignant neoplasm of colon: Secondary | ICD-10-CM | POA: Diagnosis not present

## 2021-05-24 LAB — COLOGUARD: COLOGUARD: NEGATIVE

## 2021-06-07 ENCOUNTER — Ambulatory Visit
Admission: RE | Admit: 2021-06-07 | Discharge: 2021-06-07 | Disposition: A | Discharge status: Home / Self Care | Payer: Medicare Other | Source: Ambulatory Visit | Attending: Physician Assistant | Admitting: Physician Assistant

## 2021-06-07 ENCOUNTER — Other Ambulatory Visit: Payer: Self-pay

## 2021-06-07 DIAGNOSIS — Z1231 Encounter for screening mammogram for malignant neoplasm of breast: Secondary | ICD-10-CM | POA: Insufficient documentation

## 2021-06-29 ENCOUNTER — Telehealth: Payer: Self-pay

## 2021-06-29 ENCOUNTER — Other Ambulatory Visit: Payer: Self-pay | Admitting: Physician Assistant

## 2021-06-30 ENCOUNTER — Other Ambulatory Visit: Payer: Self-pay

## 2021-06-30 MED ORDER — EMPAGLIFLOZIN 10 MG PO TABS: 10.0000 mg | ORAL_TABLET | Freq: Every day | ORAL | 1 refills | Status: DC

## 2021-06-30 NOTE — Telephone Encounter (Signed)
error 

## 2021-06-30 NOTE — Telephone Encounter (Signed)
Pt called on 06/30/21 and c/o the farxiga was to expensive at 140.00 and is requesting something different.  Per Lauren we changed to Jardiance 10 mg tablet once a day.  Will see what her insurance covers for this medication

## 2021-07-18 ENCOUNTER — Other Ambulatory Visit: Payer: Self-pay | Admitting: Internal Medicine

## 2021-07-18 DIAGNOSIS — E785 Hyperlipidemia, unspecified: Secondary | ICD-10-CM

## 2021-07-25 ENCOUNTER — Other Ambulatory Visit: Payer: Self-pay | Admitting: Physician Assistant

## 2021-07-25 DIAGNOSIS — I10 Essential (primary) hypertension: Secondary | ICD-10-CM

## 2021-08-04 ENCOUNTER — Ambulatory Visit (INDEPENDENT_AMBULATORY_CARE_PROVIDER_SITE_OTHER): Payer: Medicare Other | Admitting: Physician Assistant

## 2021-08-04 ENCOUNTER — Encounter: Payer: Self-pay | Admitting: Physician Assistant

## 2021-08-04 VITALS — BP 138/82 | HR 76 | Temp 98.2°F | Resp 16 | Ht 66.0 in | Wt 191.6 lb

## 2021-08-04 DIAGNOSIS — I10 Essential (primary) hypertension: Secondary | ICD-10-CM | POA: Diagnosis not present

## 2021-08-04 DIAGNOSIS — Z794 Long term (current) use of insulin: Secondary | ICD-10-CM

## 2021-08-04 DIAGNOSIS — E1165 Type 2 diabetes mellitus with hyperglycemia: Secondary | ICD-10-CM | POA: Diagnosis not present

## 2021-08-04 DIAGNOSIS — N289 Disorder of kidney and ureter, unspecified: Secondary | ICD-10-CM

## 2021-08-04 DIAGNOSIS — E038 Other specified hypothyroidism: Secondary | ICD-10-CM

## 2021-08-04 LAB — POCT GLYCOSYLATED HEMOGLOBIN (HGB A1C): Hemoglobin A1C: 6.7 % — AB (ref 4.0–5.6)

## 2021-08-04 MED ORDER — CHLORTHALIDONE 25 MG PO TABS: ORAL_TABLET | ORAL | 3 refills | Status: DC

## 2021-08-04 MED ORDER — LEVOTHYROXINE SODIUM 50 MCG PO TABS: ORAL_TABLET | ORAL | 1 refills | Status: DC

## 2021-08-04 NOTE — Progress Notes (Signed)
[facility] ?[address] ?[place], [place] [address] ? ?Internal MEDICINE  ?Office Visit Note ? ?Patient Name: Judy Graves ? [id number]  ?[id number] ? ?Date of Service: 08/16/2021 ? ?Chief Complaint  ?Patient presents with  ? Follow-up  ? Diabetes  ? Gastroesophageal Reflux  ? Hypertension  ? ? ?HPI ?Pt is here for routine follow up ?-Jardiance is still expensive, but able to continue getting it. Unfortunately insurance would not give any info on preferred medication or costs ?-BP and BG not checked  ?-tried to call for eye exam but hasnt heard back ?-Has noticed some foods feel stuck in throat, concened about thyroid since her mom had a goiter with same symptoms. Her labs have been stable, but worth checking [place]. If ok then may consider upper GI/swallow study in future especially if symptoms worsening ?-Agreeable to renal US at this time due to elevated Creatinine at past few checks ? ?Current Medication: ?Outpatient Encounter Medications as of 08/04/2021  ?Medication Sig  ? aspirin (ASPIRIN LOW DOSE) 81 MG EC tablet Take 1 tablet (81 mg total) by mouth daily. Swallow whole.  ? atorvastatin (LIPITOR) 40 MG tablet TAKE 1 TABLET BY MOUTH EVERY DAY  ? empagliflozin (JARDIANCE) 10 MG TABS tablet Take 1 tablet (10 mg total) by mouth daily.  ? EPIPEN 2-PAK 0.3 MG/0.3ML SOAJ injection AS DIRECTED AS DIRECTED INJECTION 2  ? ergocalciferol (DRISDOL) 1.25 MG (50000 UT) capsule Take one cap q week  ? glucose blood (ACCU-CHEK GUIDE) test strip Use as instructed once daily. Dx e11.65  ? metoprolol succinate (TOPROL-XL) 50 MG 24 hr tablet TAKE 1 TABLET BY MOUTH EVERY DAY  ? omeprazole (PRILOSEC) 40 MG capsule TAKE 1 CAPSULE (40 MG TOTAL) BY MOUTH DAILY.  ? [DISCONTINUED] chlorthalidone (HYGROTON) 25 MG tablet TAKE ONE TABLET BY MOUTH DAILY *INCREASE POTASSIUM INTAKE WITH DIET*  ? [DISCONTINUED] levothyroxine (SYNTHROID) 50 MCG tablet TAKE 1 TABLET DAILY AND 2 TABLETS ON SUNDAYS  ? chlorthalidone (HYGROTON) 25 MG tablet  TAKE ONE TABLET BY MOUTH DAILY *INCREASE POTASSIUM INTAKE WITH DIET*  ? levothyroxine (SYNTHROID) 50 MCG tablet TAKE 1 TABLET DAILY AND 2 TABLETS ON SUNDAYS  ? ?No facility-administered encounter medications on file as of 08/04/2021.  ? ? ?Surgical History: ?Past Surgical History:  ?Procedure Laterality Date  ? CARPAL TUNNEL RELEASE Right 2010  ? Dr. Sabra Heck  ? CATARACT EXTRACTION W/PHACO Left 11/02/2015  ? Procedure: CATARACT EXTRACTION PHACO AND INTRAOCULAR LENS PLACEMENT ([place]) left eye;  Surgeon: Leandrew Koyanagi, MD;  Location: [place];  Service: Ophthalmology;  Laterality: Left;  ? CHONDROPLASTY Left 11/14/2015  ? Procedure: CHONDROPLASTY;  Surgeon: Dereck Leep, MD;  Location: [facility];  Service: Orthopedics;  Laterality: Left;  ? KNEE ARTHROSCOPY WITH LATERAL MENISECTOMY Left 11/14/2015  ? Procedure: KNEE ARTHROSCOPY WITH LATERAL MENISECTOMY;  Surgeon: Dereck Leep, MD;  Location: [facility];  Service: Orthopedics;  Laterality: Left;  ? KNEE ARTHROSCOPY WITH MEDIAL MENISECTOMY Left 11/14/2015  ? Procedure: KNEE ARTHROSCOPY WITH MEDIAL MENISECTOMY;  Surgeon: Dereck Leep, MD;  Location: [facility];  Service: Orthopedics;  Laterality: Left;  ? TONSILLECTOMY    ? TUBAL LIGATION    ? ? ?Medical History: ?Past Medical History:  ?Diagnosis Date  ? Allergy   ? Arthritis   ? in knee  ? Chronic constipation   ? Chronic insomnia   ? Diabetes mellitus without complication ([place])   ? GERD (gastroesophageal reflux disease)   ? Hemorrhoids   ? Hypertension   ?  Hypothyroidism, adult   ? Impingement syndrome of right shoulder   ? [facility] subacromial bursitis and tendinitis right shoulder  ? Lumbar spinal stenosis   ? Dr. Phyllis Ginger  ? Lump or mass in breast   ? Dr. Jamal Collin evaluated, likely lipoma  ? Menopause   ? Metabolic syndrome   ? Mild carpal tunnel syndrome of right wrist   ? Obesity (BMI 35.0-39.9 without comorbidity)   ? Proteinuria   ? ? ?Family History: ?Family History  ?Problem Relation Age of  Onset  ? Cancer Mother   ?     Breast  ? Diabetes Mother   ? Breast cancer Mother   ? Hypertension Father   ? CVA Father   ?     35's  ? Hypertension Sister   ? Hypertension Brother   ? Breast cancer Paternal Aunt   ? ? ?Social History  ? ?Socioeconomic History  ? Marital status: Married  ?  Spouse name: Not on file  ? Number of children: Not on file  ? Years of education: Not on file  ? Highest education level: Not on file  ?Occupational History  ? Not on file  ?Tobacco Use  ? Smoking status: Never  ? Smokeless tobacco: Never  ?Substance and Sexual Activity  ? Alcohol use: No  ?  Alcohol/week: 0.0 standard drinks  ? Drug use: No  ? Sexual activity: Yes  ?  Partners: Male  ?Other Topics Concern  ? Not on file  ?Social History Narrative  ? Not on file  ? ?Social Determinants of Health  ? ?Financial Resource Strain: Not on file  ?Food Insecurity: Not on file  ?Transportation Needs: Not on file  ?Physical Activity: Not on file  ?Stress: Not on file  ?Social Connections: Not on file  ?Intimate Partner Violence: Not on file  ? ? ? ? ?Review of Systems  ?Constitutional:  Negative for chills, diaphoresis and fatigue.  ?HENT:  Positive for trouble swallowing. Negative for ear pain, postnasal drip and sinus pressure.   ?Eyes:  Negative for photophobia, discharge, redness, itching and visual disturbance.  ?Respiratory:  Negative for cough, shortness of breath and wheezing.   ?Cardiovascular:  Negative for chest pain, palpitations and leg swelling.  ?Gastrointestinal:  Negative for abdominal pain, constipation, diarrhea, nausea and vomiting.  ?Genitourinary:  Negative for dysuria and flank pain.  ?Musculoskeletal:  Negative for arthralgias, back pain, gait problem and neck pain.  ?Skin:  Negative for color change.  ?Allergic/Immunologic: Negative for environmental allergies and food allergies.  ?Neurological:  Negative for dizziness and headaches.  ?Hematological:  Does not bruise/bleed easily.  ?Psychiatric/Behavioral:   Negative for agitation, behavioral problems (depression) and hallucinations.   ? ?Vital Signs: ?BP 138/82   Pulse 76   Temp 98.2 ?F (36.8 ?C)   Resp 16   Ht '5\' 6"'$  (1.676 m)   Wt 191 lb 9.6 oz (86.9 kg)   SpO2 97%   BMI 30.93 kg/m?  ? ? ?Physical Exam ?Vitals and nursing note reviewed.  ?Constitutional:   ?   General: She is not in acute distress. ?   Appearance: She is well-developed. She is obese. She is not diaphoretic.  ?HENT:  ?   Head: Normocephalic and atraumatic.  ?   Mouth/Throat:  ?   Pharynx: No oropharyngeal exudate.  ?Eyes:  ?   Pupils: Pupils are equal, round, and reactive to light.  ?Neck:  ?   Thyroid: No thyromegaly.  ?   Vascular: No JVD.  ?  Trachea: No tracheal deviation.  ?Cardiovascular:  ?   Rate and Rhythm: Normal rate and regular rhythm.  ?   Heart sounds: Normal heart sounds. No murmur heard. ?  No friction rub. No gallop.  ?Pulmonary:  ?   Effort: Pulmonary effort is normal. No respiratory distress.  ?   Breath sounds: No wheezing or rales.  ?Chest:  ?   Chest wall: No tenderness.  ?Abdominal:  ?   General: Bowel sounds are normal.  ?   Palpations: Abdomen is soft.  ?Musculoskeletal:     ?   General: Normal range of motion.  ?   Cervical back: Normal range of motion and neck supple.  ?Lymphadenopathy:  ?   Cervical: No cervical adenopathy.  ?Skin: ?   General: Skin is warm and dry.  ?Neurological:  ?   Mental Status: She is alert and oriented to person, place, and time.  ?   Cranial Nerves: No cranial nerve deficit.  ?Psychiatric:     ?   Behavior: Behavior normal.     ?   Thought Content: Thought content normal.     ?   Judgment: Judgment normal.  ? ? ? ? ? ?Assessment/Plan: ?1. Type 2 diabetes mellitus with hyperglycemia, without long-term current use of insulin (HCC) ?- POCT HgB A1C is 6.7 which is stable from previous. Will continue current medication and work on diet and exercise ? ?2. Benign hypertension ?Stable, continue current medication ?- chlorthalidone (HYGROTON) 25 MG  tablet; TAKE ONE TABLET BY MOUTH DAILY *INCREASE POTASSIUM INTAKE WITH DIET*  Dispense: 90 tablet; Refill: 3 ? ?3. Other specified hypothyroidism ?We will continue on current dose of Synthroid and obtain a thyroid u

## 2021-09-04 ENCOUNTER — Ambulatory Visit: Payer: Medicare Other

## 2021-09-04 DIAGNOSIS — E038 Other specified hypothyroidism: Secondary | ICD-10-CM

## 2021-09-07 ENCOUNTER — Ambulatory Visit: Payer: Medicare Other | Admitting: Dermatology

## 2021-09-07 DIAGNOSIS — L720 Epidermal cyst: Secondary | ICD-10-CM

## 2021-09-07 DIAGNOSIS — D492 Neoplasm of unspecified behavior of bone, soft tissue, and skin: Secondary | ICD-10-CM

## 2021-09-07 DIAGNOSIS — L821 Other seborrheic keratosis: Secondary | ICD-10-CM

## 2021-09-07 DIAGNOSIS — L820 Inflamed seborrheic keratosis: Secondary | ICD-10-CM | POA: Diagnosis not present

## 2021-09-07 DIAGNOSIS — D485 Neoplasm of uncertain behavior of skin: Secondary | ICD-10-CM

## 2021-09-07 NOTE — Patient Instructions (Addendum)

## 2021-09-07 NOTE — Progress Notes (Signed)
New Patient Visit  Subjective  Judy Graves is a 66 y.o. female who presents for the following: Irregular skin lesions (On the L neck and L popliteal area - have been treated twice with LN2 at another dermatology office. Patient would like to discuss treatment options) and Nodule (L post neck - treated with I&D in the past at another office, but it has since grown back). The patient has spots, moles and lesions to be evaluated, some may be new or changing and the patient has concerns that these could be cancer.  The following portions of the chart were reviewed this encounter and updated as appropriate:   Tobacco  Allergies  Meds  Problems  Med Hx  Surg Hx  Fam Hx     Review of Systems:  No other skin or systemic complaints except as noted in HPI or Assessment and Plan.  Objective  Well appearing patient in no apparent distress; mood and affect are within normal limits.  A focused examination was performed including the face and neck. Relevant physical exam findings are noted in the Assessment and Plan.  L lat base of neck 1.1 cm firm SQ nodule  L popliteal 1.2 cm keratotic papule  L mandible 0.7 cm keratotic papule.   Assessment & Plan  Epidermal inclusion cyst L lat base of neck Benign-appearing. Exam most consistent with an epidermal inclusion cyst. Discussed that a cyst is a benign growth that can grow over time and sometimes get irritated or inflamed. Recommend observation if it is not bothersome. Discussed option of surgical excision to remove it if it is growing, symptomatic, or other changes noted. Please call for new or changing lesions so they can be evaluated.  Neoplasm of uncertain behavior of skin (2) L popliteal Epidermal / dermal shaving  Lesion diameter (cm):  1.2 Informed consent: discussed and consent obtained   Timeout: patient name, date of birth, surgical site, and procedure verified   Procedure prep:  Patient was prepped and draped in usual  sterile fashion Prep type:  Isopropyl alcohol Anesthesia: the lesion was anesthetized in a standard fashion   Anesthetic:  1% lidocaine w/ epinephrine 1-100,000 buffered w/ 8.4% NaHCO3 Instrument used: flexible razor blade   Hemostasis achieved with: pressure, aluminum chloride and electrodesiccation   Outcome: patient tolerated procedure well   Post-procedure details: sterile dressing applied and wound care instructions given   Dressing type: bandage and petrolatum    Specimen 1 - Surgical pathology Differential Diagnosis: D48.5 r/o ISK vs other  Check Margins: No  L mandible Epidermal / dermal shaving  Lesion diameter (cm):  0.7 Informed consent: discussed and consent obtained   Timeout: patient name, date of birth, surgical site, and procedure verified   Procedure prep:  Patient was prepped and draped in usual sterile fashion Prep type:  Isopropyl alcohol Anesthesia: the lesion was anesthetized in a standard fashion   Anesthetic:  1% lidocaine w/ epinephrine 1-100,000 buffered w/ 8.4% NaHCO3 Instrument used: flexible razor blade   Hemostasis achieved with: pressure, aluminum chloride and electrodesiccation   Outcome: patient tolerated procedure well   Post-procedure details: sterile dressing applied and wound care instructions given   Dressing type: bandage and petrolatum    Specimen 2 - Surgical pathology Differential Diagnosis: D48.5 r/o ISK vs  Check Margins: No  Seborrheic Keratoses - Stuck-on, waxy, tan-brown papules and/or plaques  - Benign-appearing - Discussed benign etiology and prognosis. - Observe - Call for any changes  Return if symptoms worsen or fail  to improve.  Luther Redo, CMA, am acting as scribe for Sarina Ser, MD . Documentation: I have reviewed the above documentation for accuracy and completeness, and I agree with the above.  Sarina Ser, MD

## 2021-09-11 ENCOUNTER — Telehealth: Payer: Self-pay

## 2021-09-11 NOTE — Telephone Encounter (Signed)
LM on VM please return my call  

## 2021-09-11 NOTE — Telephone Encounter (Signed)
-----   Message from Ralene Bathe, MD sent at 09/10/2021  6:29 PM EDT ----- ?Diagnosis ?1. Skin , left popliteal ?SEBORRHEIC KERATOSIS, IRRITATED ?2. Skin , left mandible ?SEBORRHEIC KERATOSIS, IRRITATED ? ?1&2 - both benign irritated keratoses ?No further treatment needed ?Recheck next visit ?

## 2021-09-13 ENCOUNTER — Telehealth: Payer: Self-pay

## 2021-09-13 NOTE — Telephone Encounter (Signed)
-----   Message from Ralene Bathe, MD sent at 09/10/2021  6:29 PM EDT ----- ?Diagnosis ?1. Skin , left popliteal ?SEBORRHEIC KERATOSIS, IRRITATED ?2. Skin , left mandible ?SEBORRHEIC KERATOSIS, IRRITATED ? ?1&2 - both benign irritated keratoses ?No further treatment needed ?Recheck next visit ?

## 2021-09-13 NOTE — Telephone Encounter (Signed)
Patient advised of BX results .aw 

## 2021-09-18 ENCOUNTER — Ambulatory Visit: Payer: Medicare Other

## 2021-09-18 DIAGNOSIS — N289 Disorder of kidney and ureter, unspecified: Secondary | ICD-10-CM | POA: Diagnosis not present

## 2021-09-18 DIAGNOSIS — N2889 Other specified disorders of kidney and ureter: Secondary | ICD-10-CM

## 2021-09-25 ENCOUNTER — Encounter: Payer: Self-pay | Admitting: Dermatology

## 2021-09-28 ENCOUNTER — Encounter: Payer: Self-pay | Admitting: Physician Assistant

## 2021-09-28 ENCOUNTER — Ambulatory Visit (INDEPENDENT_AMBULATORY_CARE_PROVIDER_SITE_OTHER): Payer: Medicare Other | Admitting: Physician Assistant

## 2021-09-28 VITALS — BP 132/72 | HR 76 | Temp 98.0°F | Resp 16 | Ht 66.0 in | Wt 192.0 lb

## 2021-09-28 DIAGNOSIS — E039 Hypothyroidism, unspecified: Secondary | ICD-10-CM | POA: Diagnosis not present

## 2021-09-28 DIAGNOSIS — E1165 Type 2 diabetes mellitus with hyperglycemia: Secondary | ICD-10-CM

## 2021-09-28 DIAGNOSIS — I10 Essential (primary) hypertension: Secondary | ICD-10-CM

## 2021-09-28 DIAGNOSIS — N289 Disorder of kidney and ureter, unspecified: Secondary | ICD-10-CM | POA: Diagnosis not present

## 2021-09-28 NOTE — Progress Notes (Signed)
[facility] [place], [place] [address]  Internal MEDICINE  Office Visit Note  Patient Name: Judy Graves  [id number]  [id number]  Date of Service: 09/28/2021  Chief Complaint  Patient presents with   Follow-up    U/S   Diabetes   Hypertension    HPI Pt is here for routine follow up to review [place] -has had a DEXA scan many years ago that was normal. Would like to hold off on rechecking until next mammogram due and can do them together -BP at home 140/80s sometimes lower.  -Amlodipine and ACEI cause swelling and rash and will update allergy list -Reviewed thyroid US and renal US which were bother normal and reassuring -she is due for microalbumin and will check this today  Current Medication: Outpatient Encounter Medications as of 09/28/2021  Medication Sig   aspirin (ASPIRIN LOW DOSE) 81 MG EC tablet Take 1 tablet (81 mg total) by mouth daily. Swallow whole.   atorvastatin (LIPITOR) 40 MG tablet TAKE 1 TABLET BY MOUTH EVERY DAY   chlorthalidone (HYGROTON) 25 MG tablet TAKE ONE TABLET BY MOUTH DAILY *INCREASE POTASSIUM INTAKE WITH DIET*   empagliflozin (JARDIANCE) 10 MG TABS tablet Take 1 tablet (10 mg total) by mouth daily.   EPIPEN 2-PAK 0.3 MG/0.3ML SOAJ injection AS DIRECTED AS DIRECTED INJECTION 2   ergocalciferol (DRISDOL) 1.25 MG (50000 UT) capsule Take one cap q week   glucose blood (ACCU-CHEK GUIDE) test strip Use as instructed once daily. Dx e11.65   levothyroxine (SYNTHROID) 50 MCG tablet TAKE 1 TABLET DAILY AND 2 TABLETS ON SUNDAYS   metoprolol succinate (TOPROL-XL) 50 MG 24 hr tablet TAKE 1 TABLET BY MOUTH EVERY DAY   omeprazole (PRILOSEC) 40 MG capsule TAKE 1 CAPSULE (40 MG TOTAL) BY MOUTH DAILY.   No facility-administered encounter medications on file as of 09/28/2021.    Surgical History: Past Surgical History:  Procedure Laterality Date   CARPAL TUNNEL RELEASE Right 2010   Dr. Sabra Heck   CATARACT EXTRACTION W/PHACO Left 11/02/2015    Procedure: CATARACT EXTRACTION PHACO AND INTRAOCULAR LENS PLACEMENT ([place]) left eye;  Surgeon: Leandrew Koyanagi, MD;  Location: [place];  Service: Ophthalmology;  Laterality: Left;   CHONDROPLASTY Left 11/14/2015   Procedure: CHONDROPLASTY;  Surgeon: Dereck Leep, MD;  Location: [facility];  Service: Orthopedics;  Laterality: Left;   KNEE ARTHROSCOPY WITH LATERAL MENISECTOMY Left 11/14/2015   Procedure: KNEE ARTHROSCOPY WITH LATERAL MENISECTOMY;  Surgeon: Dereck Leep, MD;  Location: [facility];  Service: Orthopedics;  Laterality: Left;   KNEE ARTHROSCOPY WITH MEDIAL MENISECTOMY Left 11/14/2015   Procedure: KNEE ARTHROSCOPY WITH MEDIAL MENISECTOMY;  Surgeon: Dereck Leep, MD;  Location: [facility];  Service: Orthopedics;  Laterality: Left;   TONSILLECTOMY     TUBAL LIGATION      Medical History: Past Medical History:  Diagnosis Date   Allergy    Arthritis    in knee   Chronic constipation    Chronic insomnia    Diabetes mellitus without complication (HCC)    GERD (gastroesophageal reflux disease)    Hemorrhoids    Hypertension    Hypothyroidism, adult    Impingement syndrome of right shoulder    [facility] subacromial bursitis and tendinitis right shoulder   Lumbar spinal stenosis    Dr. Phyllis Ginger   Lump or mass in breast    Dr. Jamal Collin evaluated, likely lipoma   Menopause    Metabolic syndrome    Mild carpal tunnel syndrome  of right wrist    Obesity (BMI 35.0-39.9 without comorbidity)    Proteinuria     Family History: Family History  Problem Relation Age of Onset   Cancer Mother        Breast   Diabetes Mother    Breast cancer Mother    Hypertension Father    CVA Father        72's   Hypertension Sister    Hypertension Brother    Breast cancer Paternal Aunt     Social History   Socioeconomic History   Marital status: Married    Spouse name: Not on file   Number of children: Not on file   Years of education: Not on file   Highest education  level: Not on file  Occupational History   Not on file  Tobacco Use   Smoking status: Never   Smokeless tobacco: Never  Substance and Sexual Activity   Alcohol use: No    Alcohol/week: 0.0 standard drinks   Drug use: No   Sexual activity: Yes    Partners: Male  Other Topics Concern   Not on file  Social History Narrative   Not on file   Social Determinants of Health   Financial Resource Strain: Not on file  Food Insecurity: Not on file  Transportation Needs: Not on file  Physical Activity: Not on file  Stress: Not on file  Social Connections: Not on file  Intimate Partner Violence: Not on file      Review of Systems  Constitutional:  Negative for chills, diaphoresis and fatigue.  HENT:  Negative for ear pain, postnasal drip and sinus pressure.   Eyes:  Negative for photophobia, discharge, redness, itching and visual disturbance.  Respiratory:  Negative for cough, shortness of breath and wheezing.   Cardiovascular:  Negative for chest pain, palpitations and leg swelling.  Gastrointestinal:  Negative for abdominal pain, constipation, diarrhea, nausea and vomiting.  Genitourinary:  Negative for dysuria and flank pain.  Musculoskeletal:  Negative for arthralgias, back pain, gait problem and neck pain.  Skin:  Negative for color change.  Allergic/Immunologic: Negative for environmental allergies and food allergies.  Neurological:  Negative for dizziness and headaches.  Hematological:  Does not bruise/bleed easily.  Psychiatric/Behavioral:  Negative for agitation, behavioral problems (depression) and hallucinations.    Vital Signs: BP 132/72 Comment: 143/74  Pulse 76   Temp 98 F (36.7 C)   Resp 16   Ht '5\' 6"'$  (1.676 m)   Wt 192 lb (87.1 kg)   SpO2 97%   BMI 30.99 kg/m    Physical Exam Vitals and nursing note reviewed.  Constitutional:      General: She is not in acute distress.    Appearance: She is well-developed. She is obese. She is not diaphoretic.  HENT:      Head: Normocephalic and atraumatic.     Mouth/Throat:     Pharynx: No oropharyngeal exudate.  Eyes:     Pupils: Pupils are equal, round, and reactive to light.  Neck:     Thyroid: No thyromegaly.     Vascular: No JVD.     Trachea: No tracheal deviation.  Cardiovascular:     Rate and Rhythm: Normal rate and regular rhythm.     Heart sounds: Normal heart sounds. No murmur heard.   No friction rub. No gallop.  Pulmonary:     Effort: Pulmonary effort is normal. No respiratory distress.     Breath sounds: No wheezing or rales.  Chest:     Chest wall: No tenderness.  Abdominal:     General: Bowel sounds are normal.     Palpations: Abdomen is soft.  Musculoskeletal:        General: Normal range of motion.     Cervical back: Normal range of motion and neck supple.  Lymphadenopathy:     Cervical: No cervical adenopathy.  Skin:    General: Skin is warm and dry.  Neurological:     Mental Status: She is alert and oriented to person, place, and time.     Cranial Nerves: No cranial nerve deficit.  Psychiatric:        Behavior: Behavior normal.        Thought Content: Thought content normal.        Judgment: Judgment normal.       Assessment/Plan: 1. Benign hypertension Stable, continue current medication and monitoring  2. Type 2 diabetes mellitus with hyperglycemia, without long-term current use of insulin (HCC) Will update microalbumin, due for A1c next visit - Microalbumin, urine  3. Abnormal renal function Renal US reviewed and was normal, will continue to monitor  4. Hypothyroidism, unspecified type Thyroid US was normal, will continue synthroid as before and continue to monitor   General Counseling: boneta standre understanding of the findings of todays visit and agrees with plan of treatment. I have discussed any further diagnostic evaluation that may be needed or ordered today. We also reviewed her medications today. she has been encouraged to call the office  with any questions or concerns that should arise related to todays visit.    Orders Placed This Encounter  Procedures   Microalbumin, urine    No orders of the defined types were placed in this encounter.   This patient was seen by Drema Dallas, PA-C in collaboration with Dr. Clayborn Bigness as a part of collaborative care agreement.   Total time spent:30 Minutes Time spent includes review of chart, medications, test results, and follow up plan with the patient.      Dr Lavera Guise Internal medicine

## 2021-09-29 LAB — MICROALBUMIN, URINE: Microalbumin, Urine: <3 ug/mL

## 2021-10-24 ENCOUNTER — Other Ambulatory Visit: Payer: Self-pay | Admitting: Physician Assistant

## 2021-10-24 DIAGNOSIS — I10 Essential (primary) hypertension: Secondary | ICD-10-CM

## 2021-11-03 ENCOUNTER — Encounter: Payer: Self-pay | Admitting: Physician Assistant

## 2021-11-03 ENCOUNTER — Ambulatory Visit (INDEPENDENT_AMBULATORY_CARE_PROVIDER_SITE_OTHER): Payer: Medicare Other | Admitting: Physician Assistant

## 2021-11-03 VITALS — BP 131/74 | HR 80 | Temp 98.3°F | Resp 16 | Ht 66.0 in | Wt 189.2 lb

## 2021-11-03 DIAGNOSIS — E039 Hypothyroidism, unspecified: Secondary | ICD-10-CM | POA: Diagnosis not present

## 2021-11-03 DIAGNOSIS — E1165 Type 2 diabetes mellitus with hyperglycemia: Secondary | ICD-10-CM

## 2021-11-03 DIAGNOSIS — I10 Essential (primary) hypertension: Secondary | ICD-10-CM

## 2021-11-03 LAB — POCT GLYCOSYLATED HEMOGLOBIN (HGB A1C): Hemoglobin A1C: 6.6 % — AB (ref 4.0–5.6)

## 2021-11-03 NOTE — Progress Notes (Signed)
[facility] [place], [place] [address]  Internal MEDICINE  Office Visit Note  Patient Name: Judy Graves  [id number]  [id number]  Date of Service: 11/03/2021  Chief Complaint  Patient presents with   Follow-up   Hypertension   Gastroesophageal Reflux   Diabetes   Results    HPI Pt is here for routine follow up and has no complaints today -BG have been stable and is tolerating jardiance well -[place] were reviewed last visit -She is down 3 lbs in the last month -BP stable  Current Medication: Outpatient Encounter Medications as of 11/03/2021  Medication Sig   aspirin (ASPIRIN LOW DOSE) 81 MG EC tablet Take 1 tablet (81 mg total) by mouth daily. Swallow whole.   atorvastatin (LIPITOR) 40 MG tablet TAKE 1 TABLET BY MOUTH EVERY DAY   chlorthalidone (HYGROTON) 25 MG tablet TAKE ONE TABLET BY MOUTH DAILY *INCREASE POTASSIUM INTAKE WITH DIET*   empagliflozin (JARDIANCE) 10 MG TABS tablet Take 1 tablet (10 mg total) by mouth daily.   EPIPEN 2-PAK 0.3 MG/0.3ML SOAJ injection AS DIRECTED AS DIRECTED INJECTION 2   ergocalciferol (DRISDOL) 1.25 MG (50000 UT) capsule Take one cap q week   glucose blood (ACCU-CHEK GUIDE) test strip Use as instructed once daily. Dx e11.65   levothyroxine (SYNTHROID) 50 MCG tablet TAKE 1 TABLET DAILY AND 2 TABLETS ON SUNDAYS   metoprolol succinate (TOPROL-XL) 50 MG 24 hr tablet TAKE 1 TABLET BY MOUTH EVERY DAY   omeprazole (PRILOSEC) 40 MG capsule TAKE 1 CAPSULE (40 MG TOTAL) BY MOUTH DAILY.   No facility-administered encounter medications on file as of 11/03/2021.    Surgical History: Past Surgical History:  Procedure Laterality Date   CARPAL TUNNEL RELEASE Right 2010   Dr. Sabra Heck   CATARACT EXTRACTION W/PHACO Left 11/02/2015   Procedure: CATARACT EXTRACTION PHACO AND INTRAOCULAR LENS PLACEMENT ([place]) left eye;  Surgeon: Leandrew Koyanagi, MD;  Location: [place];  Service: Ophthalmology;  Laterality: Left;    CHONDROPLASTY Left 11/14/2015   Procedure: CHONDROPLASTY;  Surgeon: Dereck Leep, MD;  Location: [facility];  Service: Orthopedics;  Laterality: Left;   KNEE ARTHROSCOPY WITH LATERAL MENISECTOMY Left 11/14/2015   Procedure: KNEE ARTHROSCOPY WITH LATERAL MENISECTOMY;  Surgeon: Dereck Leep, MD;  Location: [facility];  Service: Orthopedics;  Laterality: Left;   KNEE ARTHROSCOPY WITH MEDIAL MENISECTOMY Left 11/14/2015   Procedure: KNEE ARTHROSCOPY WITH MEDIAL MENISECTOMY;  Surgeon: Dereck Leep, MD;  Location: [facility];  Service: Orthopedics;  Laterality: Left;   TONSILLECTOMY     TUBAL LIGATION      Medical History: Past Medical History:  Diagnosis Date   Allergy    Arthritis    in knee   Chronic constipation    Chronic insomnia    Diabetes mellitus without complication (HCC)    GERD (gastroesophageal reflux disease)    Hemorrhoids    Hypertension    Hypothyroidism, adult    Impingement syndrome of right shoulder    [facility] subacromial bursitis and tendinitis right shoulder   Lumbar spinal stenosis    Dr. Phyllis Ginger   Lump or mass in breast    Dr. Jamal Collin evaluated, likely lipoma   Menopause    Metabolic syndrome    Mild carpal tunnel syndrome of right wrist    Obesity (BMI 35.0-39.9 without comorbidity)    Proteinuria     Family History: Family History  Problem Relation Age of Onset   Cancer Mother  Breast   Diabetes Mother    Breast cancer Mother    Hypertension Father    CVA Father        46's   Hypertension Sister    Hypertension Brother    Breast cancer Paternal Aunt     Social History   Socioeconomic History   Marital status: Married    Spouse name: Not on file   Number of children: Not on file   Years of education: Not on file   Highest education level: Not on file  Occupational History   Not on file  Tobacco Use   Smoking status: Never   Smokeless tobacco: Never  Substance and Sexual Activity   Alcohol use: No    Alcohol/week: 0.0  standard drinks of alcohol   Drug use: No   Sexual activity: Yes    Partners: Male  Other Topics Concern   Not on file  Social History Narrative   Not on file   Social Determinants of Health   Financial Resource Strain: Not on file  Food Insecurity: Not on file  Transportation Needs: Not on file  Physical Activity: Not on file  Stress: Not on file  Social Connections: Not on file  Intimate Partner Violence: Not on file      Review of Systems  Constitutional:  Negative for chills, diaphoresis and fatigue.  HENT:  Negative for ear pain, postnasal drip and sinus pressure.   Eyes:  Negative for photophobia, discharge, redness, itching and visual disturbance.  Respiratory:  Negative for cough, shortness of breath and wheezing.   Cardiovascular:  Negative for chest pain, palpitations and leg swelling.  Gastrointestinal:  Negative for abdominal pain, constipation, diarrhea, nausea and vomiting.  Genitourinary:  Negative for dysuria and flank pain.  Musculoskeletal:  Negative for arthralgias, back pain, gait problem and neck pain.  Skin:  Negative for color change.  Allergic/Immunologic: Negative for environmental allergies and food allergies.  Neurological:  Negative for dizziness and headaches.  Hematological:  Does not bruise/bleed easily.  Psychiatric/Behavioral:  Negative for agitation, behavioral problems (depression) and hallucinations.     Vital Signs: BP 131/74   Pulse 80   Temp 98.3 F (36.8 C)   Resp 16   Ht '5\' 6"'$  (1.676 m)   Wt 189 lb 3.2 oz (85.8 kg)   SpO2 98%   BMI 30.54 kg/m    Physical Exam Vitals and nursing note reviewed.  Constitutional:      General: She is not in acute distress.    Appearance: She is well-developed. She is obese. She is not diaphoretic.  HENT:     Head: Normocephalic and atraumatic.     Mouth/Throat:     Pharynx: No oropharyngeal exudate.  Eyes:     Pupils: Pupils are equal, round, and reactive to light.  Neck:     Thyroid:  No thyromegaly.     Vascular: No JVD.     Trachea: No tracheal deviation.  Cardiovascular:     Rate and Rhythm: Normal rate and regular rhythm.     Heart sounds: Normal heart sounds. No murmur heard.    No friction rub. No gallop.  Pulmonary:     Effort: Pulmonary effort is normal. No respiratory distress.     Breath sounds: No wheezing or rales.  Chest:     Chest wall: No tenderness.  Abdominal:     General: Bowel sounds are normal.     Palpations: Abdomen is soft.  Musculoskeletal:  General: Normal range of motion.     Cervical back: Normal range of motion and neck supple.  Lymphadenopathy:     Cervical: No cervical adenopathy.  Skin:    General: Skin is warm and dry.  Neurological:     Mental Status: She is alert and oriented to person, place, and time.     Cranial Nerves: No cranial nerve deficit.  Psychiatric:        Behavior: Behavior normal.        Thought Content: Thought content normal.        Judgment: Judgment normal.        Assessment/Plan: 1. Type 2 diabetes mellitus with hyperglycemia, without long-term current use of insulin (HCC) - POCT HgB A1C is 6.6 which is improved from 6.7 last check. Continue current medications and working on diet and exercise.  2. Benign hypertension Stable, continue current medications  3. Hypothyroidism, unspecified type continue synthroid as before   General Counseling: Satomi verbalizes understanding of the findings of todays visit and agrees with plan of treatment. I have discussed any further diagnostic evaluation that may be needed or ordered today. We also reviewed her medications today. she has been encouraged to call the office with any questions or concerns that should arise related to todays visit.    Orders Placed This Encounter  Procedures   POCT HgB A1C    No orders of the defined types were placed in this encounter.   This patient was seen by Drema Dallas, PA-C in collaboration with Dr. Clayborn Bigness as a part of collaborative care agreement.   Total time spent:30 Minutes Time spent includes review of chart, medications, test results, and follow up plan with the patient.      Dr Lavera Guise Internal medicine

## 2021-11-09 ENCOUNTER — Encounter: Payer: Self-pay | Admitting: Physician Assistant

## 2021-11-09 ENCOUNTER — Ambulatory Visit (INDEPENDENT_AMBULATORY_CARE_PROVIDER_SITE_OTHER): Payer: Medicare Other | Admitting: Physician Assistant

## 2021-11-09 VITALS — BP 123/74 | HR 73 | Temp 98.3°F | Resp 16 | Ht 66.0 in | Wt 190.0 lb

## 2021-11-09 DIAGNOSIS — K120 Recurrent oral aphthae: Secondary | ICD-10-CM | POA: Diagnosis not present

## 2021-11-09 NOTE — Progress Notes (Signed)
[facility] [place], [place] [address]  Internal MEDICINE  Office Visit Note  Patient Name: Judy Graves  [id number]  [id number]  Date of Service: 11/15/2021  Chief Complaint  Patient presents with   Sore Throat    Only symptom, started one day ago - has not tested for Covid or Strep     HPI Pt is here for a sick visit. -Denies any sore throat, just a sore spot on tongue for the last day and is bothering her -denies any sinus congestion, cough, or COB -Advised to try listerine to clean and may use chloraseptic for pain if needed. Reassured it should reoslve on its own  Current Medication:  Outpatient Encounter Medications as of 11/09/2021  Medication Sig   aspirin (ASPIRIN LOW DOSE) 81 MG EC tablet Take 1 tablet (81 mg total) by mouth daily. Swallow whole.   atorvastatin (LIPITOR) 40 MG tablet TAKE 1 TABLET BY MOUTH EVERY DAY   chlorthalidone (HYGROTON) 25 MG tablet TAKE ONE TABLET BY MOUTH DAILY *INCREASE POTASSIUM INTAKE WITH DIET*   empagliflozin (JARDIANCE) 10 MG TABS tablet Take 1 tablet (10 mg total) by mouth daily.   EPIPEN 2-PAK 0.3 MG/0.3ML SOAJ injection AS DIRECTED AS DIRECTED INJECTION 2   ergocalciferol (DRISDOL) 1.25 MG (50000 UT) capsule Take one cap q week   glucose blood (ACCU-CHEK GUIDE) test strip Use as instructed once daily. Dx e11.65   levothyroxine (SYNTHROID) 50 MCG tablet TAKE 1 TABLET DAILY AND 2 TABLETS ON SUNDAYS   metoprolol succinate (TOPROL-XL) 50 MG 24 hr tablet TAKE 1 TABLET BY MOUTH EVERY DAY   omeprazole (PRILOSEC) 40 MG capsule TAKE 1 CAPSULE (40 MG TOTAL) BY MOUTH DAILY.   No facility-administered encounter medications on file as of 11/09/2021.      Medical History: Past Medical History:  Diagnosis Date   Allergy    Arthritis    in knee   Chronic constipation    Chronic insomnia    Diabetes mellitus without complication (HCC)    GERD (gastroesophageal reflux disease)    Hemorrhoids    Hypertension     Hypothyroidism, adult    Impingement syndrome of right shoulder    [facility] subacromial bursitis and tendinitis right shoulder   Lumbar spinal stenosis    Dr. Phyllis Ginger   Lump or mass in breast    Dr. Jamal Collin evaluated, likely lipoma   Menopause    Metabolic syndrome    Mild carpal tunnel syndrome of right wrist    Obesity (BMI 35.0-39.9 without comorbidity)    Proteinuria      Vital Signs: BP 123/74   Pulse 73   Temp 98.3 F (36.8 C)   Resp 16   Ht '5\' 6"'$  (1.676 m)   Wt 190 lb (86.2 kg)   SpO2 96%   BMI 30.67 kg/m    Review of Systems  Constitutional:  Negative for fatigue and fever.  HENT:  Positive for sore throat. Negative for congestion, mouth sores, postnasal drip, sinus pressure and trouble swallowing.        Sore spot on tongue  Respiratory:  Negative for cough.   Cardiovascular:  Negative for chest pain.  Genitourinary:  Negative for flank pain.  Psychiatric/Behavioral: Negative.      Physical Exam Vitals and nursing note reviewed.  Constitutional:      General: She is not in acute distress.    Appearance: She is well-developed. She is obese. She is not diaphoretic.  HENT:  Head: Normocephalic and atraumatic.     Mouth/Throat:     Mouth: Oral lesions present.     Pharynx: No oropharyngeal exudate.     Comments: Aphthous ulcer on left side of tongue Eyes:     Pupils: Pupils are equal, round, and reactive to light.  Neck:     Thyroid: No thyromegaly.     Vascular: No JVD.     Trachea: No tracheal deviation.  Cardiovascular:     Rate and Rhythm: Normal rate and regular rhythm.     Heart sounds: Normal heart sounds. No murmur heard.    No friction rub. No gallop.  Pulmonary:     Effort: Pulmonary effort is normal. No respiratory distress.     Breath sounds: No wheezing or rales.  Chest:     Chest wall: No tenderness.  Abdominal:     General: Bowel sounds are normal.     Palpations: Abdomen is soft.  Musculoskeletal:        General:  Normal range of motion.     Cervical back: Normal range of motion and neck supple.  Lymphadenopathy:     Cervical: No cervical adenopathy.  Skin:    General: Skin is warm and dry.  Neurological:     Mental Status: She is alert and oriented to person, place, and time.     Cranial Nerves: No cranial nerve deficit.  Psychiatric:        Behavior: Behavior normal.        Thought Content: Thought content normal.        Judgment: Judgment normal.       Assessment/Plan: 1. Aphthous ulcer of tongue Educated on cleaning techniques with mouth wash such as Listerine and may use chloraseptic spray on the lesion for pain control. Reassured these resolve on their own, but may call office if any worsening or new symptoms arise.   General Counseling: monna crean understanding of the findings of todays visit and agrees with plan of treatment. I have discussed any further diagnostic evaluation that may be needed or ordered today. We also reviewed her medications today. she has been encouraged to call the office with any questions or concerns that should arise related to todays visit.    Counseling:    No orders of the defined types were placed in this encounter.   No orders of the defined types were placed in this encounter.   Time spent:25 Minutes

## 2021-11-15 NOTE — Patient Instructions (Signed)
Canker Sores  Canker sores are small, painful sores that develop inside the mouth. You can get one or more canker sores on the inside of your lips or cheeks, on your tongue, or anywhere inside your mouth. Canker sores cannot be passed from person to person (are not contagious). These sores are different from the sores that you may get on the outside of your lips (cold sores or fever blisters). What are the causes? The cause of this condition is not known. The condition may be passed down from a parent (genetic). What increases the risk? This condition is more likely to develop in: Females. People in their teens or 20s. Females who are having their menstrual period. People who are under a lot of emotional stress. People who do not get enough iron or B vitamins. People who do not take care of their mouth and teeth (have poor oral hygiene). People who have an injury inside the mouth, such as after having dental work or from chewing something hard. What are the signs or symptoms? Canker sores usually start as painful red bumps. Then they turn into small white, yellow, or gray sores that have red borders. The sores may be painful, and the pain may get worse when you eat or drink. In severe cases, along with the canker sore, symptoms may also include: Fever. Tiredness (fatigue). Swollen lymph nodes in your neck. How is this diagnosed? This condition may be diagnosed based on your symptoms and an exam of the inside of your mouth. If you get canker sores often or if they are very bad, you may have tests, such as: Blood tests to rule out possible causes. Swabbing a fluid sample from the sore to be tested for infection. Removing a small tissue sample from the sore (biopsy). How is this treated? Most canker sores go away without treatment in about 1 week. Home care is usually the only treatment that you will need. Over-the-counter medicines can relieve discomfort. If you have severe canker sores, your  health care provider may prescribe: Numbing ointment to relieve pain. Do not use products that contain benzocaine (including numbing gels) to treat teething or mouth pain in children who are younger than 2 years. These products may cause a rare but serious blood condition. Vitamins. Steroid medicines. These may be given as pills, mouth rinses, or gels. Antibiotic mouth rinse. Follow these instructions at home:  Apply, take, or use over-the-counter and prescription medicines only as told by your health care provider. These include vitamins and ointments. If you were prescribed an antibiotic mouth rinse, use it as told by your health care provider. Do not stop using the antibiotic even if your condition improves. Until the sores are healed: Do not drink coffee or citrus juices. Do not eat spicy or salty foods. Use a mild, over-the-counter mouth rinse as recommended by your health care provider. Take good care of your mouth and teeth (oral hygiene) by: Flossing your teeth every day. Brushing your teeth with a soft toothbrush twice each day. Contact a health care provider if: Your symptoms do not get better after 2 weeks. You also have a fever or swollen glands in your neck. You get canker sores often. You have a canker sore that is getting larger. You cannot eat or drink due to your canker sores. Summary Canker sores are small, painful sores that develop inside the mouth. Canker sores usually start as painful red bumps that turn into small white, yellow, or gray sores that have red   borders. The sores may be painful, and the pain may get worse when you eat or drink. Most canker sores clear up without treatment in about 1 week. Over-the-counter medicines can relieve discomfort. This information is not intended to replace advice given to you by your health care provider. Make sure you discuss any questions you have with your health care provider. Document Revised: 02/01/2021 Document Reviewed:  02/01/2021 Elsevier Patient Education  2023 Elsevier Inc.  

## 2021-12-15 DIAGNOSIS — E119 Type 2 diabetes mellitus without complications: Secondary | ICD-10-CM | POA: Diagnosis not present

## 2022-01-02 DIAGNOSIS — H2511 Age-related nuclear cataract, right eye: Secondary | ICD-10-CM | POA: Diagnosis not present

## 2022-01-09 ENCOUNTER — Other Ambulatory Visit: Payer: Self-pay | Admitting: Physician Assistant

## 2022-01-31 ENCOUNTER — Encounter: Payer: Self-pay | Admitting: Ophthalmology

## 2022-02-06 NOTE — Discharge Instructions (Signed)

## 2022-02-07 ENCOUNTER — Ambulatory Visit: Payer: Medicare Other | Admitting: Anesthesiology

## 2022-02-07 ENCOUNTER — Encounter: Payer: Self-pay | Admitting: Ophthalmology

## 2022-02-07 ENCOUNTER — Encounter
Admission: RE | Disposition: A | Discharge status: Home / Self Care | Payer: Self-pay | Source: Home / Self Care | Attending: Ophthalmology

## 2022-02-07 ENCOUNTER — Ambulatory Visit
Admission: RE | Admit: 2022-02-07 | Discharge: 2022-02-07 | Disposition: A | Discharge status: Home / Self Care | Payer: Medicare Other | Attending: Ophthalmology | Admitting: Ophthalmology

## 2022-02-07 ENCOUNTER — Other Ambulatory Visit: Payer: Self-pay

## 2022-02-07 DIAGNOSIS — Z7984 Long term (current) use of oral hypoglycemic drugs: Secondary | ICD-10-CM | POA: Diagnosis not present

## 2022-02-07 DIAGNOSIS — E039 Hypothyroidism, unspecified: Secondary | ICD-10-CM | POA: Diagnosis not present

## 2022-02-07 DIAGNOSIS — H2511 Age-related nuclear cataract, right eye: Secondary | ICD-10-CM | POA: Insufficient documentation

## 2022-02-07 DIAGNOSIS — T7840XA Allergy, unspecified, initial encounter: Secondary | ICD-10-CM | POA: Diagnosis not present

## 2022-02-07 DIAGNOSIS — Z6831 Body mass index (BMI) 31.0-31.9, adult: Secondary | ICD-10-CM | POA: Insufficient documentation

## 2022-02-07 DIAGNOSIS — E669 Obesity, unspecified: Secondary | ICD-10-CM | POA: Insufficient documentation

## 2022-02-07 DIAGNOSIS — E1136 Type 2 diabetes mellitus with diabetic cataract: Secondary | ICD-10-CM | POA: Diagnosis not present

## 2022-02-07 DIAGNOSIS — I10 Essential (primary) hypertension: Secondary | ICD-10-CM | POA: Diagnosis not present

## 2022-02-07 DIAGNOSIS — E785 Hyperlipidemia, unspecified: Secondary | ICD-10-CM | POA: Diagnosis not present

## 2022-02-07 HISTORY — PX: CATARACT EXTRACTION W/PHACO: SHX586

## 2022-02-07 HISTORY — DX: Presence of dental prosthetic device (complete) (partial): Z97.2

## 2022-02-07 LAB — GLUCOSE, CAPILLARY
Glucose-Capillary: 147 mg/dL — ABNORMAL HIGH (ref 70–99)
Glucose-Capillary: 140 mg/dL — ABNORMAL HIGH (ref 70–99)

## 2022-02-07 SURGERY — PHACOEMULSIFICATION, CATARACT, WITH IOL INSERTION
Anesthesia: Monitor Anesthesia Care | Site: Eye | Laterality: Right

## 2022-02-07 MED ORDER — ACETAMINOPHEN 325 MG PO TABS: 650.0000 mg | ORAL_TABLET | Freq: Once | ORAL | Status: DC | PRN

## 2022-02-07 MED ORDER — TETRACAINE HCL 0.5 % OP SOLN
1.0000 [drp] | OPHTHALMIC | Status: DC | PRN
Start: 2022-02-07 — End: 2022-02-07
  Administered 2022-02-07 (×3): 1 [drp] via OPHTHALMIC

## 2022-02-07 MED ORDER — SIGHTPATH DOSE#1 BSS IO SOLN
INTRAOCULAR | Status: DC | PRN
  Administered 2022-02-07: 2 mL

## 2022-02-07 MED ORDER — MIDAZOLAM HCL 2 MG/2ML IJ SOLN
INTRAMUSCULAR | Status: DC | PRN
  Administered 2022-02-07: 1 mg via INTRAVENOUS

## 2022-02-07 MED ORDER — LACTATED RINGERS IV SOLN
INTRAVENOUS | Status: DC
Start: 2022-02-07 — End: 2022-02-07

## 2022-02-07 MED ORDER — BRIMONIDINE TARTRATE-TIMOLOL 0.2-0.5 % OP SOLN
OPHTHALMIC | Status: DC | PRN
  Administered 2022-02-07: 1 [drp] via OPHTHALMIC

## 2022-02-07 MED ORDER — SIGHTPATH DOSE#1 BSS IO SOLN
INTRAOCULAR | Status: DC | PRN
  Administered 2022-02-07: 15 mL via INTRAOCULAR

## 2022-02-07 MED ORDER — ONDANSETRON HCL 4 MG/2ML IJ SOLN
4.0000 mg | Freq: Once | INTRAMUSCULAR | Status: DC | PRN
Start: 2022-02-07 — End: 2022-02-07

## 2022-02-07 MED ORDER — SIGHTPATH DOSE#1 NA HYALUR & NA CHOND-NA HYALUR IO KIT
PACK | INTRAOCULAR | Status: DC | PRN
  Administered 2022-02-07: 1 via OPHTHALMIC

## 2022-02-07 MED ORDER — FENTANYL CITRATE (PF) 100 MCG/2ML IJ SOLN
INTRAMUSCULAR | Status: DC | PRN
  Administered 2022-02-07: 25 ug via INTRAVENOUS
  Administered 2022-02-07: 50 ug via INTRAVENOUS

## 2022-02-07 MED ORDER — ACETAMINOPHEN 160 MG/5ML PO SOLN: 325.0000 mg | ORAL | Status: DC | PRN

## 2022-02-07 MED ORDER — ARMC OPHTHALMIC DILATING DROPS
1.0000 | OPHTHALMIC | Status: DC | PRN
  Administered 2022-02-07 (×3): 1 via OPHTHALMIC

## 2022-02-07 MED ORDER — CEFUROXIME OPHTHALMIC INJECTION 1 MG/0.1 ML
INJECTION | OPHTHALMIC | Status: DC | PRN
  Administered 2022-02-07: 0.1 mL via INTRACAMERAL

## 2022-02-07 MED ORDER — SIGHTPATH DOSE#1 BSS IO SOLN
INTRAOCULAR | Status: DC | PRN
  Administered 2022-02-07: 47 mL via OPHTHALMIC

## 2022-02-07 SURGICAL SUPPLY — 12 items
CANNULA ANT/CHMB 27G (MISCELLANEOUS) IMPLANT
CANNULA ANT/CHMB 27GA (MISCELLANEOUS) IMPLANT
CATARACT SUITE SIGHTPATH (MISCELLANEOUS) ×1 IMPLANT
FEE CATARACT SUITE SIGHTPATH (MISCELLANEOUS) ×1 IMPLANT
GLOVE SRG 8 PF TXTR STRL LF DI (GLOVE) ×1 IMPLANT
GLOVE SURG ENC TEXT LTX SZ7.5 (GLOVE) ×1 IMPLANT
GLOVE SURG UNDER POLY LF SZ8 (GLOVE) ×1
LENS IOL TECNIS EYHANCE 19.0 (Intraocular Lens) IMPLANT
NDL FILTER BLUNT 18X1 1/2 (NEEDLE) ×1 IMPLANT
NEEDLE FILTER BLUNT 18X1 1/2 (NEEDLE) ×1 IMPLANT
SYR 3ML LL SCALE MARK (SYRINGE) ×1 IMPLANT
WATER STERILE IRR 250ML POUR (IV SOLUTION) ×1 IMPLANT

## 2022-02-07 NOTE — Transfer of Care (Signed)
Immediate Anesthesia Transfer of Care Note  Patient: Judy Graves  Procedure(s) Performed: CATARACT EXTRACTION PHACO AND INTRAOCULAR LENS PLACEMENT (IOC) RIGHT DIABETIC 6.41 00:43.7 (Right: Eye)  Patient Location: PACU  Anesthesia Type: MAC  Level of Consciousness: awake, alert  and patient cooperative  Airway and Oxygen Therapy: Patient Spontanous Breathing and Patient connected to supplemental oxygen  Post-op Assessment: Post-op Vital signs reviewed, Patient's Cardiovascular Status Stable, Respiratory Function Stable, Patent Airway and No signs of Nausea or vomiting  Post-op Vital Signs: Reviewed and stable  Complications: There were no known notable events for this encounter.

## 2022-02-07 NOTE — H&P (Signed)
[facility]   Primary Care Physician:  Mylinda Latina, PA-C Ophthalmologist: Dr. Leandrew Koyanagi  Pre-Procedure History & Physical: HPI:  Judy Graves is a 66 y.o. female here for ophthalmic surgery.   Past Medical History:  Diagnosis Date   Allergy    Arthritis    in knee   Chronic constipation    Chronic insomnia    Dental bridge present    Permanent - top - front   Diabetes mellitus without complication (HCC)    GERD (gastroesophageal reflux disease)    Hemorrhoids    Hypertension    Hypothyroidism, adult    Impingement syndrome of right shoulder    [facility] subacromial bursitis and tendinitis right shoulder   Lumbar spinal stenosis    Dr. Phyllis Ginger   Lump or mass in breast    Dr. Jamal Collin evaluated, likely lipoma   Menopause    Metabolic syndrome    Mild carpal tunnel syndrome of right wrist    Obesity (BMI 35.0-39.9 without comorbidity)    Proteinuria     Past Surgical History:  Procedure Laterality Date   CARPAL TUNNEL RELEASE Right 2010   Dr. Sabra Heck   CATARACT EXTRACTION W/PHACO Left 11/02/2015   Procedure: CATARACT EXTRACTION PHACO AND INTRAOCULAR LENS PLACEMENT ([place]) left eye;  Surgeon: Leandrew Koyanagi, MD;  Location: [place];  Service: Ophthalmology;  Laterality: Left;   CHONDROPLASTY Left 11/14/2015   Procedure: CHONDROPLASTY;  Surgeon: Dereck Leep, MD;  Location: [facility];  Service: Orthopedics;  Laterality: Left;   KNEE ARTHROSCOPY WITH LATERAL MENISECTOMY Left 11/14/2015   Procedure: KNEE ARTHROSCOPY WITH LATERAL MENISECTOMY;  Surgeon: Dereck Leep, MD;  Location: [facility];  Service: Orthopedics;  Laterality: Left;   KNEE ARTHROSCOPY WITH MEDIAL MENISECTOMY Left 11/14/2015   Procedure: KNEE ARTHROSCOPY WITH MEDIAL MENISECTOMY;  Surgeon: Dereck Leep, MD;  Location: [facility];  Service: Orthopedics;  Laterality: Left;   TONSILLECTOMY     TUBAL LIGATION      Prior to Admission medications   Medication Sig Start  Date End Date Taking? Authorizing Provider  aspirin (ASPIRIN LOW DOSE) 81 MG EC tablet Take 1 tablet (81 mg total) by mouth daily. Swallow whole. 07/27/20  Yes Lavera Guise, MD  atorvastatin (LIPITOR) 40 MG tablet TAKE 1 TABLET BY MOUTH EVERY DAY 07/18/21  Yes McDonough, Lauren K, PA-C  chlorthalidone (HYGROTON) 25 MG tablet TAKE ONE TABLET BY MOUTH DAILY *INCREASE POTASSIUM INTAKE WITH DIET* 08/04/21  Yes McDonough, Lauren K, PA-C  EPIPEN 2-PAK 0.3 MG/0.3ML SOAJ injection AS DIRECTED AS DIRECTED INJECTION 2 04/26/15  Yes [provider]  JARDIANCE 10 MG TABS tablet TAKE 1 TABLET BY MOUTH EVERY DAY 01/09/22  Yes McDonough, Lauren K, PA-C  levothyroxine (SYNTHROID) 50 MCG tablet TAKE 1 TABLET DAILY AND 2 TABLETS ON SUNDAYS 08/04/21  Yes McDonough, Lauren K, PA-C  metoprolol succinate (TOPROL-XL) 50 MG 24 hr tablet TAKE 1 TABLET BY MOUTH EVERY DAY 10/24/21  Yes McDonough, Lauren K, PA-C  ergocalciferol (DRISDOL) 1.25 MG (50000 UT) capsule Take one cap q week Patient not taking: Reported on 01/31/2022 05/05/21   Drema Dallas K, PA-C  glucose blood (ACCU-CHEK GUIDE) test strip Use as instructed once daily. Dx e11.65 09/29/20   Mylinda Latina, PA-C    Allergies as of 12/19/2021 - Review Complete 11/09/2021  Allergen Reaction Noted   Ace inhibitors Swelling 04/07/2015   Amlodipine Swelling 09/28/2021    Family History  Problem Relation Age of Onset  Cancer Mother        Breast   Diabetes Mother    Breast cancer Mother    Hypertension Father    CVA Father        69's   Hypertension Sister    Hypertension Brother    Breast cancer Paternal Aunt     Social History   Socioeconomic History   Marital status: Married    Spouse name: Not on file   Number of children: Not on file   Years of education: Not on file   Highest education level: Not on file  Occupational History   Not on file  Tobacco Use   Smoking status: Never   Smokeless tobacco: Never  Vaping Use   Vaping  Use: Never used  Substance and Sexual Activity   Alcohol use: No    Alcohol/week: 0.0 standard drinks of alcohol   Drug use: No   Sexual activity: Yes    Partners: Male  Other Topics Concern   Not on file  Social History Narrative   Not on file   Social Determinants of Health   Financial Resource Strain: Not on file  Food Insecurity: Not on file  Transportation Needs: Not on file  Physical Activity: Not on file  Stress: Not on file  Social Connections: Not on file  Intimate Partner Violence: Not on file    Review of Systems: See HPI, otherwise negative ROS  Physical Exam: BP (!) 151/74   Temp 97.7 F (36.5 C) (Tympanic)   Resp 19   Ht 5' 5.98" (1.676 m)   Wt 86.2 kg   SpO2 97%   BMI 30.68 kg/m  General:   Alert,  pleasant and cooperative in NAD Head:  Normocephalic and atraumatic. Lungs:  Clear to auscultation.    Heart:  Regular rate and rhythm.   Impression/Plan: Judy Graves is here for ophthalmic surgery.  Risks, benefits, limitations, and alternatives regarding ophthalmic surgery have been reviewed with the patient.  Questions have been answered.  All parties agreeable.   Leandrew Koyanagi, MD  02/07/2022, 10:41 AM

## 2022-02-07 NOTE — Op Note (Signed)
LOCATION:  [place]   PREOPERATIVE DIAGNOSIS:    Nuclear sclerotic cataract right eye. H25.11   POSTOPERATIVE DIAGNOSIS:  Nuclear sclerotic cataract right eye.     PROCEDURE:  Phacoemusification with posterior chamber intraocular lens placement of the right eye   ULTRASOUND TIME: Procedure(s) with comments: CATARACT EXTRACTION PHACO AND INTRAOCULAR LENS PLACEMENT (IOC) RIGHT DIABETIC 6.41 00:43.7 (Right) - Diabetic  LENS:   Implant Name Type Inv. Item Serial No. Manufacturer Lot No. LRB No. Used Action  LENS IOL TECNIS EYHANCE 19.0 - [id number] Intraocular Lens LENS IOL TECNIS EYHANCE 19.0 [id number] [employer]  Right 1 Implanted         SURGEON:  Wyonia Hough, MD   ANESTHESIA:  Topical with tetracaine drops and 2% Xylocaine jelly, augmented with 1% preservative-free intracameral lidocaine.    COMPLICATIONS:  None.   DESCRIPTION OF PROCEDURE:  The patient was identified in the holding room and transported to the operating room and placed in the supine position under the operating microscope.  The right eye was identified as the operative eye and it was prepped and draped in the usual sterile ophthalmic fashion.   A 1 millimeter clear-corneal paracentesis was made at the 12:00 position.  0.5 ml of preservative-free 1% lidocaine was injected into the anterior chamber. The anterior chamber was filled with Viscoat viscoelastic.  A 2.4 millimeter keratome was used to make a near-clear corneal incision at the 9:00 position.  A curvilinear capsulorrhexis was made with a cystotome and capsulorrhexis forceps.  Balanced salt solution was used to hydrodissect and hydrodelineate the nucleus.   Phacoemulsification was then used in stop and chop fashion to remove the lens nucleus and epinucleus.  The remaining cortex was then removed using the irrigation and aspiration handpiece. Provisc was then placed into the capsular bag to distend it for lens placement.  A lens was then  injected into the capsular bag.  The remaining viscoelastic was aspirated.   Wounds were hydrated with balanced salt solution.  The anterior chamber was inflated to a physiologic pressure with balanced salt solution.  No wound leaks were noted. Cefuroxime 0.1 ml of a '10mg'$ /ml solution was injected into the anterior chamber for a dose of 1 mg of intracameral antibiotic at the completion of the case.   Timolol and Brimonidine drops were applied to the eye.  The patient was taken to the recovery room in stable condition without complications of anesthesia or surgery.   Flavio Lindroth 02/07/2022, 11:49 AM

## 2022-02-07 NOTE — Anesthesia Preprocedure Evaluation (Addendum)
Anesthesia Evaluation  Patient identified by MRN, date of birth, ID band Patient awake    Reviewed: Allergy & Precautions, NPO status , Patient's Chart, lab work & pertinent test results  History of Anesthesia Complications Negative for: history of anesthetic complications  Airway Mallampati: III   Neck ROM: Full    Dental   Bridge :   Pulmonary neg pulmonary ROS,    Pulmonary exam normal breath sounds clear to auscultation       Cardiovascular hypertension, Normal cardiovascular exam Rhythm:Regular Rate:Normal     Neuro/Psych negative neurological ROS     GI/Hepatic GERD  ,  Endo/Other  diabetes, Type 2Hypothyroidism Obesity   Renal/GU negative Renal ROS     Musculoskeletal  (+) Arthritis ,   Abdominal   Peds  Hematology negative hematology ROS (+)   Anesthesia Other Findings   Reproductive/Obstetrics                            Anesthesia Physical Anesthesia Plan  ASA: 2  Anesthesia Plan: MAC   Post-op Pain Management:    Induction: Intravenous  PONV Risk Score and Plan: 2 and Treatment may vary due to age or medical condition, Midazolam and TIVA  Airway Management Planned: Natural Airway and Nasal Cannula  Additional Equipment:   Intra-op Plan:   Post-operative Plan:   Informed Consent: I have reviewed the patients History and Physical, chart, labs and discussed the procedure including the risks, benefits and alternatives for the proposed anesthesia with the patient or authorized representative who has indicated his/her understanding and acceptance.     Dental advisory given  Plan Discussed with: CRNA  Anesthesia Plan Comments: (LMA/GETA backup discussed.  Patient consented for risks of anesthesia including but not limited to:  - adverse reactions to medications - damage to eyes, teeth, lips or other oral mucosa - nerve damage due to positioning  - sore throat or  hoarseness - damage to heart, brain, nerves, lungs, other parts of body or loss of life  Informed patient about role of CRNA in peri- and intra-operative care.  Patient voiced understanding.)        Anesthesia Quick Evaluation

## 2022-02-07 NOTE — Anesthesia Postprocedure Evaluation (Signed)
Anesthesia Post Note  Patient: Judy Graves  Procedure(s) Performed: CATARACT EXTRACTION PHACO AND INTRAOCULAR LENS PLACEMENT (IOC) RIGHT DIABETIC 6.41 00:43.7 (Right: Eye)  Patient location during evaluation: PACU Anesthesia Type: MAC Level of consciousness: awake and alert, oriented and patient cooperative Pain management: pain level controlled Vital Signs Assessment: post-procedure vital signs reviewed and stable Respiratory status: spontaneous breathing, nonlabored ventilation and respiratory function stable Cardiovascular status: blood pressure returned to baseline and stable Postop Assessment: adequate PO intake Anesthetic complications: no   There were no known notable events for this encounter.   Last Vitals:  Vitals:   02/07/22 1151 02/07/22 1156  BP: 132/79 138/69  Pulse: 63 66  Resp: 13 12  Temp: (!) 36.1 C (!) 36.1 C  SpO2: 94% 95%    Last Pain:  Vitals:   02/07/22 1156  TempSrc:   PainSc: 0-No pain                 Darrin Nipper

## 2022-02-08 ENCOUNTER — Encounter: Payer: Self-pay | Admitting: Ophthalmology

## 2022-02-21 ENCOUNTER — Telehealth: Payer: Self-pay

## 2022-02-22 ENCOUNTER — Other Ambulatory Visit: Payer: Self-pay

## 2022-02-22 ENCOUNTER — Ambulatory Visit (INDEPENDENT_AMBULATORY_CARE_PROVIDER_SITE_OTHER): Payer: Medicare Other

## 2022-02-22 DIAGNOSIS — Z23 Encounter for immunization: Secondary | ICD-10-CM

## 2022-02-22 MED ORDER — RYBELSUS 3 MG PO TABS: ORAL_TABLET | ORAL | 2 refills | Status: DC

## 2022-02-22 NOTE — Telephone Encounter (Signed)
SEND MED

## 2022-03-06 DIAGNOSIS — Z961 Presence of intraocular lens: Secondary | ICD-10-CM | POA: Diagnosis not present

## 2022-03-15 ENCOUNTER — Other Ambulatory Visit: Payer: Self-pay | Admitting: Physician Assistant

## 2022-03-15 DIAGNOSIS — E038 Other specified hypothyroidism: Secondary | ICD-10-CM

## 2022-04-27 ENCOUNTER — Encounter: Payer: Medicare Other | Admitting: Physician Assistant

## 2022-05-10 ENCOUNTER — Encounter: Payer: Self-pay | Admitting: Physician Assistant

## 2022-05-10 ENCOUNTER — Ambulatory Visit (INDEPENDENT_AMBULATORY_CARE_PROVIDER_SITE_OTHER): Payer: Medicare Other | Admitting: Physician Assistant

## 2022-05-10 VITALS — BP 136/82 | HR 73 | Temp 98.2°F | Resp 16 | Ht 66.0 in | Wt 190.8 lb

## 2022-05-10 DIAGNOSIS — E538 Deficiency of other specified B group vitamins: Secondary | ICD-10-CM | POA: Diagnosis not present

## 2022-05-10 DIAGNOSIS — E039 Hypothyroidism, unspecified: Secondary | ICD-10-CM | POA: Diagnosis not present

## 2022-05-10 DIAGNOSIS — E785 Hyperlipidemia, unspecified: Secondary | ICD-10-CM

## 2022-05-10 DIAGNOSIS — Z1231 Encounter for screening mammogram for malignant neoplasm of breast: Secondary | ICD-10-CM

## 2022-05-10 DIAGNOSIS — E2839 Other primary ovarian failure: Secondary | ICD-10-CM

## 2022-05-10 DIAGNOSIS — E559 Vitamin D deficiency, unspecified: Secondary | ICD-10-CM

## 2022-05-10 DIAGNOSIS — E1165 Type 2 diabetes mellitus with hyperglycemia: Secondary | ICD-10-CM | POA: Diagnosis not present

## 2022-05-10 DIAGNOSIS — Z0001 Encounter for general adult medical examination with abnormal findings: Secondary | ICD-10-CM | POA: Diagnosis not present

## 2022-05-10 DIAGNOSIS — R5383 Other fatigue: Secondary | ICD-10-CM | POA: Diagnosis not present

## 2022-05-10 DIAGNOSIS — R300 Dysuria: Secondary | ICD-10-CM

## 2022-05-10 DIAGNOSIS — I10 Essential (primary) hypertension: Secondary | ICD-10-CM | POA: Diagnosis not present

## 2022-05-10 LAB — POCT GLYCOSYLATED HEMOGLOBIN (HGB A1C): Hemoglobin A1C: 6.7 % — AB (ref 4.0–5.6)

## 2022-05-10 MED ORDER — METOPROLOL SUCCINATE ER 50 MG PO TB24: 50.0000 mg | ORAL_TABLET | Freq: Every day | ORAL | 1 refills | Status: DC

## 2022-05-10 MED ORDER — TETANUS-DIPHTH-ACELL PERTUSSIS 5-2-15.5 LF-MCG/0.5 IM SUSP: 0.5000 mL | Freq: Once | INTRAMUSCULAR | 0 refills | Status: DC

## 2022-05-10 MED ORDER — CHLORTHALIDONE 25 MG PO TABS: ORAL_TABLET | ORAL | 3 refills | Status: DC

## 2022-05-10 NOTE — Progress Notes (Signed)
[facility] [place], [place] [address]  Internal MEDICINE  Office Visit Note  Patient Name: Judy Graves  [id number]  [id number]  Date of Service: 05/10/2022  Chief Complaint  Patient presents with   Annual Exam   Diabetes   Gastroesophageal Reflux   Hypertension   Quality Metric Gaps    TDAP and Dexa Scan     HPI Pt is here for routine health maintenance examination -Not taking any BG meds now due to donut hole, was not able to start on rybelsus ever due to cost and has not taken Jardiance in months either. BG have been ok without medication.  -Bp did improve on recheck, though hasn't taken meds yet and will therefore improve further once she takes these -foot exam done -Continues to have some night pain in right hand. Previously had carpal tunnel surgery. Only bothers her sometimes and only at night. Discussed trying nighttime splint -Due for mammogram and bone density as well as routine fasting labs -due for tdap  Current Medication: Outpatient Encounter Medications as of 05/10/2022  Medication Sig   aspirin (ASPIRIN LOW DOSE) 81 MG EC tablet Take 1 tablet (81 mg total) by mouth daily. Swallow whole.   atorvastatin (LIPITOR) 40 MG tablet TAKE 1 TABLET BY MOUTH EVERY DAY   EPIPEN 2-PAK 0.3 MG/0.3ML SOAJ injection AS DIRECTED AS DIRECTED INJECTION 2   ergocalciferol (DRISDOL) 1.25 MG (50000 UT) capsule Take one cap q week   glucose blood (ACCU-CHEK GUIDE) test strip Use as instructed once daily. Dx e11.65   levothyroxine (SYNTHROID) 50 MCG tablet TAKE 1 TABLET DAILY AND 2 TABLETS ON SUNDAYS   Semaglutide (RYBELSUS) 3 MG TABS TAKE 1 TAB PO DAILY   [DISCONTINUED] chlorthalidone (HYGROTON) 25 MG tablet TAKE ONE TABLET BY MOUTH DAILY *INCREASE POTASSIUM INTAKE WITH DIET*   [DISCONTINUED] metoprolol succinate (TOPROL-XL) 50 MG 24 hr tablet TAKE 1 TABLET BY MOUTH EVERY DAY   [DISCONTINUED] Tdap (ADACEL) 09-05-13.5 LF-MCG/0.5 injection Inject 0.5 mLs into  the muscle once.   chlorthalidone (HYGROTON) 25 MG tablet TAKE ONE TABLET BY MOUTH DAILY *INCREASE POTASSIUM INTAKE WITH DIET*   metoprolol succinate (TOPROL-XL) 50 MG 24 hr tablet Take 1 tablet (50 mg total) by mouth daily. Take with or immediately following a meal.   [DISCONTINUED] Tdap (ADACEL) 09-05-13.5 LF-MCG/0.5 injection Inject 0.5 mLs into the muscle once for 1 dose.   No facility-administered encounter medications on file as of 05/10/2022.    Surgical History: Past Surgical History:  Procedure Laterality Date   CARPAL TUNNEL RELEASE Right 2010   Dr. Sabra Heck   CATARACT EXTRACTION W/PHACO Left 11/02/2015   Procedure: CATARACT EXTRACTION PHACO AND INTRAOCULAR LENS PLACEMENT ([place]) left eye;  Surgeon: Leandrew Koyanagi, MD;  Location: [place];  Service: Ophthalmology;  Laterality: Left;   CATARACT EXTRACTION W/PHACO Right 02/07/2022   Procedure: CATARACT EXTRACTION PHACO AND INTRAOCULAR LENS PLACEMENT ([facility]) RIGHT DIABETIC 6.41 00:43.7;  Surgeon: Leandrew Koyanagi, MD;  Location: [place];  Service: Ophthalmology;  Laterality: Right;  Diabetic   CHONDROPLASTY Left 11/14/2015   Procedure: CHONDROPLASTY;  Surgeon: Dereck Leep, MD;  Location: [facility];  Service: Orthopedics;  Laterality: Left;   KNEE ARTHROSCOPY WITH LATERAL MENISECTOMY Left 11/14/2015   Procedure: KNEE ARTHROSCOPY WITH LATERAL MENISECTOMY;  Surgeon: Dereck Leep, MD;  Location: [facility];  Service: Orthopedics;  Laterality: Left;   KNEE ARTHROSCOPY WITH MEDIAL MENISECTOMY Left 11/14/2015   Procedure: KNEE ARTHROSCOPY WITH MEDIAL MENISECTOMY;  Surgeon: Dereck Leep,  MD;  Location: [facility];  Service: Orthopedics;  Laterality: Left;   TONSILLECTOMY     TUBAL LIGATION      Medical History: Past Medical History:  Diagnosis Date   Allergy    Arthritis    in knee   Chronic constipation    Chronic insomnia    Dental bridge present    Permanent - top - front   Diabetes mellitus without  complication (HCC)    GERD (gastroesophageal reflux disease)    Hemorrhoids    Hypertension    Hypothyroidism, adult    Impingement syndrome of right shoulder    [facility] subacromial bursitis and tendinitis right shoulder   Lumbar spinal stenosis    Dr. Phyllis Ginger   Lump or mass in breast    Dr. Jamal Collin evaluated, likely lipoma   Menopause    Metabolic syndrome    Mild carpal tunnel syndrome of right wrist    Obesity (BMI 35.0-39.9 without comorbidity)    Proteinuria     Family History: Family History  Problem Relation Age of Onset   Cancer Mother        Breast   Diabetes Mother    Breast cancer Mother    Hypertension Father    CVA Father        77's   Hypertension Sister    Hypertension Brother    Breast cancer Paternal Aunt       Review of Systems  Constitutional:  Negative for chills, diaphoresis and fatigue.  HENT:  Negative for ear pain, postnasal drip and sinus pressure.   Eyes:  Negative for photophobia, discharge, redness, itching and visual disturbance.  Respiratory:  Negative for cough, shortness of breath and wheezing.   Cardiovascular:  Negative for chest pain, palpitations and leg swelling.  Gastrointestinal:  Negative for abdominal pain, constipation, diarrhea, nausea and vomiting.  Genitourinary:  Negative for dysuria and flank pain.  Musculoskeletal:  Positive for arthralgias. Negative for back pain, gait problem and neck pain.  Skin:  Negative for color change.  Allergic/Immunologic: Negative for environmental allergies and food allergies.  Neurological:  Negative for dizziness and headaches.  Hematological:  Does not bruise/bleed easily.  Psychiatric/Behavioral:  Negative for agitation, behavioral problems (depression) and hallucinations.      Vital Signs: BP 136/82 Comment: 141/74  Pulse 73   Temp 98.2 F (36.8 C)   Resp 16   Ht '5\' 6"'$  (1.676 m)   Wt 190 lb 12.8 oz (86.5 kg)   SpO2 96%   BMI 30.80 kg/m    Physical Exam Vitals  and nursing note reviewed.  Constitutional:      General: She is not in acute distress.    Appearance: She is well-developed. She is obese. She is not diaphoretic.  HENT:     Head: Normocephalic and atraumatic.     Mouth/Throat:     Pharynx: No oropharyngeal exudate.  Eyes:     Pupils: Pupils are equal, round, and reactive to light.  Neck:     Thyroid: No thyromegaly.     Vascular: No JVD.     Trachea: No tracheal deviation.  Cardiovascular:     Rate and Rhythm: Normal rate and regular rhythm.     Pulses:          Dorsalis pedis pulses are 3+ on the right side and 3+ on the left side.       Posterior tibial pulses are 3+ on the right side and 3+ on the left side.  Heart sounds: Normal heart sounds. No murmur heard.    No friction rub. No gallop.  Pulmonary:     Effort: Pulmonary effort is normal. No respiratory distress.     Breath sounds: No wheezing or rales.  Chest:     Chest wall: No tenderness.  Breasts:    Right: Normal. No mass.     Left: Normal. No mass.  Abdominal:     General: Bowel sounds are normal.     Palpations: Abdomen is soft.     Tenderness: There is no abdominal tenderness.  Musculoskeletal:        General: Normal range of motion.     Cervical back: Normal range of motion and neck supple.     Right foot: Normal range of motion.     Left foot: Normal range of motion.  Feet:     Right foot:     Protective Sensation: 2 sites tested.  2 sites sensed.     Skin integrity: Dry skin present.     Toenail Condition: Right toenails are abnormally thick. Fungal disease present.    Left foot:     Protective Sensation: 2 sites tested.  2 sites sensed.     Skin integrity: Dry skin present.     Toenail Condition: Left toenails are abnormally thick. Fungal disease present. Lymphadenopathy:     Cervical: No cervical adenopathy.  Skin:    General: Skin is warm and dry.  Neurological:     Mental Status: She is alert and oriented to person, place, and time.      Cranial Nerves: No cranial nerve deficit.  Psychiatric:        Behavior: Behavior normal.        Thought Content: Thought content normal.        Judgment: Judgment normal.      LABS: Recent Results (from the past 2160 hour(s))  POCT HgB A1C     Status: Abnormal   Collection Time: 05/10/22 11:04 AM  Result Value Ref Range   Hemoglobin A1C 6.7 (A) 4.0 - 5.6 %   HbA1c POC (<> result, manual entry)     HbA1c, POC (prediabetic range)     HbA1c, POC (controlled diabetic range)          Assessment/Plan: 1. Encounter for general adult medical examination with abnormal findings Cpe performed, routine fasting labs ordered, due for mammogram and bone density - Comprehensive metabolic panel - CBC w/Diff/Platelet - Lipid Panel With LDL/HDL Ratio - TSH + free T4 - VITAMIN D 25 Hydroxy (Vit-D Deficiency, Fractures) - B12 and Folate Panel  2. Type 2 diabetes mellitus with hyperglycemia, without long-term current use of insulin (HCC) - Urine Microalbumin w/creat. ratio - POCT HgB A1C is 6.7 which is up slightly from 6.6 last visit, but overall stable despite note taking any medications now. Will continue to monitor  3. Benign hypertension Stable, continue current medications - chlorthalidone (HYGROTON) 25 MG tablet; TAKE ONE TABLET BY MOUTH DAILY *INCREASE POTASSIUM INTAKE WITH DIET*  Dispense: 90 tablet; Refill: 3 - metoprolol succinate (TOPROL-XL) 50 MG 24 hr tablet; Take 1 tablet (50 mg total) by mouth daily. Take with or immediately following a meal.  Dispense: 90 tablet; Refill: 1  4. Vitamin D deficiency - VITAMIN D 25 Hydroxy (Vit-D Deficiency, Fractures)  5. B12 deficiency - B12 and Folate Panel  6. Hypothyroidism, unspecified type - TSH + free T4  7. Hyperlipidemia, unspecified hyperlipidemia type - Lipid Panel With LDL/HDL Ratio  8. Visit  for screening mammogram - MM 3D SCREEN BREAST BILATERAL; Future  9. Other primary ovarian failure - DG Bone Density;  Future  10. Other fatigue - Comprehensive metabolic panel - CBC w/Diff/Platelet - Lipid Panel With LDL/HDL Ratio - TSH + free T4 - VITAMIN D 25 Hydroxy (Vit-D Deficiency, Fractures) - B12 and Folate Panel  11. Dysuria - UA/M w/rflx Culture, Routine   General Counseling: Leocadia verbalizes understanding of the findings of todays visit and agrees with plan of treatment. I have discussed any further diagnostic evaluation that may be needed or ordered today. We also reviewed her medications today. she has been encouraged to call the office with any questions or concerns that should arise related to todays visit.    Counseling:    Orders Placed This Encounter  Procedures   MM 3D SCREEN BREAST BILATERAL   DG Bone Density   UA/M w/rflx Culture, Routine   Urine Microalbumin w/creat. ratio   Comprehensive metabolic panel   CBC w/Diff/Platelet   Lipid Panel With LDL/HDL Ratio   TSH + free T4   VITAMIN D 25 Hydroxy (Vit-D Deficiency, Fractures)   B12 and Folate Panel   POCT HgB A1C    Meds ordered this encounter  Medications   chlorthalidone (HYGROTON) 25 MG tablet    Sig: TAKE ONE TABLET BY MOUTH DAILY *INCREASE POTASSIUM INTAKE WITH DIET*    Dispense:  90 tablet    Refill:  3   metoprolol succinate (TOPROL-XL) 50 MG 24 hr tablet    Sig: Take 1 tablet (50 mg total) by mouth daily. Take with or immediately following a meal.    Dispense:  90 tablet    Refill:  1   DISCONTD: Tdap (ADACEL) 09-05-13.5 LF-MCG/0.5 injection    Sig: Inject 0.5 mLs into the muscle once for 1 dose.    Dispense:  0.5 mL    Refill:  0    This patient was seen by Drema Dallas, PA-C in collaboration with Dr. Clayborn Bigness as a part of collaborative care agreement.  Total time spent:35 Minutes  Time spent includes review of chart, medications, test results, and follow up plan with the patient.     Lavera Guise, MD  Internal Medicine

## 2022-05-11 LAB — LIPID PANEL WITH LDL/HDL RATIO
Cholesterol, Total: 165 mg/dL (ref 100–199)
HDL: 47 mg/dL (ref >39)
LDL Chol Calc (NIH): 100 mg/dL — ABNORMAL HIGH (ref 0–99)
LDL/HDL Ratio: 2.1 ratio (ref 0.0–3.2)
Triglycerides: 98 mg/dL (ref 0–149)
VLDL Cholesterol Cal: 18 mg/dL (ref 5–40)

## 2022-05-11 LAB — COMPREHENSIVE METABOLIC PANEL
ALT: 23 IU/L (ref 0–32)
AST: 18 IU/L (ref 0–40)
Albumin/Globulin Ratio: 1.6 (ref 1.2–2.2)
Albumin: 4.2 g/dL (ref 3.9–4.9)
Alkaline Phosphatase: 78 IU/L (ref 44–121)
BUN/Creatinine Ratio: 21 (ref 12–28)
BUN: 22 mg/dL (ref 8–27)
Bilirubin Total: 0.6 mg/dL (ref 0.0–1.2)
CO2: 28 mmol/L (ref 20–29)
Calcium: 9.6 mg/dL (ref 8.7–10.3)
Chloride: 99 mmol/L (ref 96–106)
Creatinine, Ser: 1.07 mg/dL — ABNORMAL HIGH (ref 0.57–1.00)
Globulin, Total: 2.7 g/dL (ref 1.5–4.5)
Glucose: 152 mg/dL — ABNORMAL HIGH (ref 70–99)
Potassium: 4 mmol/L (ref 3.5–5.2)
Sodium: 142 mmol/L (ref 134–144)
Total Protein: 6.9 g/dL (ref 6.0–8.5)
eGFR: 58 mL/min/{1.73_m2} — ABNORMAL LOW (ref >59)

## 2022-05-11 LAB — CBC WITH DIFFERENTIAL/PLATELET
Basophils Absolute: 0 10*3/uL (ref 0.0–0.2)
Basos: 0 %
EOS (ABSOLUTE): 0.1 10*3/uL (ref 0.0–0.4)
Eos: 1 %
Hematocrit: 40.6 % (ref 34.0–46.6)
Hemoglobin: 14.4 g/dL (ref 11.1–15.9)
Immature Grans (Abs): 0 10*3/uL (ref 0.0–0.1)
Immature Granulocytes: 0 %
Lymphocytes Absolute: 3.3 10*3/uL — ABNORMAL HIGH (ref 0.7–3.1)
Lymphs: 43 %
MCH: 32.4 pg (ref 26.6–33.0)
MCHC: 35.5 g/dL (ref 31.5–35.7)
MCV: 91 fL (ref 79–97)
Monocytes Absolute: 0.7 10*3/uL (ref 0.1–0.9)
Monocytes: 9 %
Neutrophils Absolute: 3.6 10*3/uL (ref 1.4–7.0)
Neutrophils: 47 %
Platelets: 174 10*3/uL (ref 150–450)
RBC: 4.45 x10E6/uL (ref 3.77–5.28)
RDW: 12.4 % (ref 11.7–15.4)
WBC: 7.7 10*3/uL (ref 3.4–10.8)

## 2022-05-11 LAB — TSH+FREE T4
Free T4: 1.35 ng/dL (ref 0.82–1.77)
TSH: 3.47 u[IU]/mL (ref 0.450–4.500)

## 2022-05-11 LAB — VITAMIN D 25 HYDROXY (VIT D DEFICIENCY, FRACTURES): Vit D, 25-Hydroxy: 21 ng/mL — ABNORMAL LOW (ref 30.0–100.0)

## 2022-05-11 LAB — B12 AND FOLATE PANEL
Folate: 17 ng/mL (ref >3.0)
Vitamin B-12: 551 pg/mL (ref 232–1245)

## 2022-05-25 ENCOUNTER — Telehealth: Payer: Self-pay

## 2022-05-25 NOTE — Telephone Encounter (Signed)
Spoke to patient regarding lab results. Advised to continue Vitamin D supplementation per Ander Purpura.

## 2022-05-25 NOTE — Telephone Encounter (Signed)
-----  Message from Mylinda Latina, PA-C sent at 05/25/2022 12:59 PM EST ----- Please let her know that overall her labs are stable from before. Her Vit D is still low and should continue supplementing

## 2022-06-13 ENCOUNTER — Other Ambulatory Visit: Payer: Self-pay | Admitting: Physician Assistant

## 2022-06-13 DIAGNOSIS — E785 Hyperlipidemia, unspecified: Secondary | ICD-10-CM

## 2022-06-28 ENCOUNTER — Ambulatory Visit
Admission: RE | Admit: 2022-06-28 | Discharge: 2022-06-28 | Disposition: A | Discharge status: Home / Self Care | Payer: Medicare Other | Source: Ambulatory Visit | Attending: Physician Assistant | Admitting: Physician Assistant

## 2022-06-28 DIAGNOSIS — E2839 Other primary ovarian failure: Secondary | ICD-10-CM | POA: Insufficient documentation

## 2022-06-28 DIAGNOSIS — Z780 Asymptomatic menopausal state: Secondary | ICD-10-CM | POA: Diagnosis not present

## 2022-06-28 DIAGNOSIS — Z1231 Encounter for screening mammogram for malignant neoplasm of breast: Secondary | ICD-10-CM | POA: Diagnosis not present

## 2022-07-09 ENCOUNTER — Telehealth: Payer: Self-pay

## 2022-07-09 NOTE — Telephone Encounter (Signed)
Pt advised BMD is normal

## 2022-07-09 NOTE — Telephone Encounter (Signed)
-----   Message from Mylinda Latina, PA-C sent at 07/09/2022  1:06 PM EST ----- Please let her know that her bone density was normal

## 2022-07-25 ENCOUNTER — Other Ambulatory Visit: Payer: Self-pay | Admitting: Physician Assistant

## 2022-07-25 DIAGNOSIS — E038 Other specified hypothyroidism: Secondary | ICD-10-CM

## 2022-08-09 ENCOUNTER — Ambulatory Visit (INDEPENDENT_AMBULATORY_CARE_PROVIDER_SITE_OTHER): Payer: Medicare Other | Admitting: Physician Assistant

## 2022-08-09 ENCOUNTER — Encounter: Payer: Self-pay | Admitting: Physician Assistant

## 2022-08-09 VITALS — BP 130/78 | HR 72 | Temp 98.2°F | Resp 16 | Ht 66.0 in | Wt 193.0 lb

## 2022-08-09 DIAGNOSIS — I10 Essential (primary) hypertension: Secondary | ICD-10-CM

## 2022-08-09 DIAGNOSIS — E559 Vitamin D deficiency, unspecified: Secondary | ICD-10-CM | POA: Diagnosis not present

## 2022-08-09 DIAGNOSIS — E1165 Type 2 diabetes mellitus with hyperglycemia: Secondary | ICD-10-CM | POA: Diagnosis not present

## 2022-08-09 LAB — POCT GLYCOSYLATED HEMOGLOBIN (HGB A1C): Hemoglobin A1C: 7 % — AB (ref 4.0–5.6)

## 2022-08-09 MED ORDER — ERGOCALCIFEROL 1.25 MG (50000 UT) PO CAPS: ORAL_CAPSULE | ORAL | 3 refills | Status: DC

## 2022-08-09 NOTE — Progress Notes (Signed)
[facility] [place], [place] [address]  Internal MEDICINE  Office Visit Note  Patient Name: Judy Graves  [id number]  [id number]  Date of Service: 08/09/2022  Chief Complaint  Patient presents with   Follow-up   Diabetes   Hypertension    HPI Pt is here for routine follow up -Not taking any medications for diabetes currently, will work on diet and exercise to get numbers back down. If not improving then will need to restart medication -BP well controlled -Labs and mammogram and bone density previously reviewed. Not able to tolerate OTC vit D daily--constipation. Will retry drisdol as she has taken this previously  Current Medication: Outpatient Encounter Medications as of 08/09/2022  Medication Sig   aspirin (ASPIRIN LOW DOSE) 81 MG EC tablet Take 1 tablet (81 mg total) by mouth daily. Swallow whole.   atorvastatin (LIPITOR) 40 MG tablet TAKE 1 TABLET BY MOUTH EVERY DAY   chlorthalidone (HYGROTON) 25 MG tablet TAKE ONE TABLET BY MOUTH DAILY *INCREASE POTASSIUM INTAKE WITH DIET*   EPIPEN 2-PAK 0.3 MG/0.3ML SOAJ injection AS DIRECTED AS DIRECTED INJECTION 2   glucose blood (ACCU-CHEK GUIDE) test strip Use as instructed once daily. Dx e11.65   levothyroxine (SYNTHROID) 50 MCG tablet TAKE 1 TABLET DAILY AND 2 TABLETS ON SUNDAYS   metoprolol succinate (TOPROL-XL) 50 MG 24 hr tablet Take 1 tablet (50 mg total) by mouth daily. Take with or immediately following a meal.   Semaglutide (RYBELSUS) 3 MG TABS TAKE 1 TAB PO DAILY   [DISCONTINUED] ergocalciferol (DRISDOL) 1.25 MG (50000 UT) capsule Take one cap q week   ergocalciferol (DRISDOL) 1.25 MG (50000 UT) capsule Take one cap q week   No facility-administered encounter medications on file as of 08/09/2022.    Surgical History: Past Surgical History:  Procedure Laterality Date   CARPAL TUNNEL RELEASE Right 2010   Dr. Sabra Heck   CATARACT EXTRACTION W/PHACO Left 11/02/2015   Procedure: CATARACT EXTRACTION PHACO  AND INTRAOCULAR LENS PLACEMENT ([place]) left eye;  Surgeon: Leandrew Koyanagi, MD;  Location: [place];  Service: Ophthalmology;  Laterality: Left;   CATARACT EXTRACTION W/PHACO Right 02/07/2022   Procedure: CATARACT EXTRACTION PHACO AND INTRAOCULAR LENS PLACEMENT ([facility]) RIGHT DIABETIC 6.41 00:43.7;  Surgeon: Leandrew Koyanagi, MD;  Location: [facility];  Service: Ophthalmology;  Laterality: Right;  Diabetic   CHONDROPLASTY Left 11/14/2015   Procedure: CHONDROPLASTY;  Surgeon: Dereck Leep, MD;  Location: [facility];  Service: Orthopedics;  Laterality: Left;   KNEE ARTHROSCOPY WITH LATERAL MENISECTOMY Left 11/14/2015   Procedure: KNEE ARTHROSCOPY WITH LATERAL MENISECTOMY;  Surgeon: Dereck Leep, MD;  Location: [facility];  Service: Orthopedics;  Laterality: Left;   KNEE ARTHROSCOPY WITH MEDIAL MENISECTOMY Left 11/14/2015   Procedure: KNEE ARTHROSCOPY WITH MEDIAL MENISECTOMY;  Surgeon: Dereck Leep, MD;  Location: [facility];  Service: Orthopedics;  Laterality: Left;   TONSILLECTOMY     TUBAL LIGATION      Medical History: Past Medical History:  Diagnosis Date   Allergy    Arthritis    in knee   Chronic constipation    Chronic insomnia    Dental bridge present    Permanent - top - front   Diabetes mellitus without complication    GERD (gastroesophageal reflux disease)    Hemorrhoids    Hypertension    Hypothyroidism, adult    Impingement syndrome of right shoulder     [facility]th[facility] subacromial bursitis and tendinitis right shoulder   Lumbar spinal stenosis  Dr. Phyllis Ginger   Lump or mass in breast    Dr. Jamal Collin evaluated, likely lipoma   Menopause    Metabolic syndrome    Mild carpal tunnel syndrome of right wrist    Obesity (BMI 35.0-39.9 without comorbidity)    Proteinuria     Family History: Family History  Problem Relation Age of Onset   Cancer Mother        Breast   Diabetes Mother    Breast cancer Mother    Hypertension Father    CVA Father         35's   Hypertension Sister    Hypertension Brother    Breast cancer Paternal Aunt     Social History   Socioeconomic History   Marital status: Married    Spouse name: Not on file   Number of children: Not on file   Years of education: Not on file   Highest education level: Not on file  Occupational History   Not on file  Tobacco Use   Smoking status: Never   Smokeless tobacco: Never  Vaping Use   Vaping Use: Never used  Substance and Sexual Activity   Alcohol use: No    Alcohol/week: 0.0 standard drinks of alcohol   Drug use: No   Sexual activity: Yes    Partners: Male  Other Topics Concern   Not on file  Social History Narrative   Not on file   Social Determinants of Health   Financial Resource Strain: Not on file  Food Insecurity: Not on file  Transportation Needs: Not on file  Physical Activity: Not on file  Stress: Not on file  Social Connections: Not on file  Intimate Partner Violence: Not on file      Review of Systems  Constitutional:  Negative for chills, fatigue and unexpected weight change.  HENT:  Negative for congestion, rhinorrhea, sneezing and sore throat.   Eyes:  Negative for redness.  Respiratory:  Negative for cough, chest tightness and shortness of breath.   Cardiovascular:  Negative for chest pain and palpitations.  Gastrointestinal:  Positive for constipation. Negative for abdominal pain, diarrhea, nausea and vomiting.  Genitourinary:  Negative for dysuria and frequency.  Musculoskeletal:  Negative for arthralgias, back pain, joint swelling and neck pain.  Skin:  Negative for rash.  Neurological: Negative.  Negative for tremors and numbness.  Hematological:  Negative for adenopathy. Does not bruise/bleed easily.  Psychiatric/Behavioral:  Negative for behavioral problems (Depression), sleep disturbance and suicidal ideas. The patient is not nervous/anxious.     Vital Signs: BP 130/78 Comment: 142/69  Pulse 72   Temp 98.2 F (36.8  C)   Resp 16   Ht 5\' 6"  (1.676 m)   Wt 193 lb (87.5 kg)   SpO2 96%   BMI 31.15 kg/m    Physical Exam Vitals and nursing note reviewed.  Constitutional:      General: She is not in acute distress.    Appearance: She is well-developed. She is obese. She is not diaphoretic.  HENT:     Head: Normocephalic and atraumatic.     Mouth/Throat:     Pharynx: No oropharyngeal exudate.  Eyes:     Pupils: Pupils are equal, round, and reactive to light.  Neck:     Thyroid: No thyromegaly.     Vascular: No JVD.     Trachea: No tracheal deviation.  Cardiovascular:     Rate and Rhythm: Normal rate and regular rhythm.  Heart sounds: Normal heart sounds. No murmur heard.    No friction rub. No gallop.  Pulmonary:     Effort: Pulmonary effort is normal. No respiratory distress.     Breath sounds: No wheezing or rales.  Chest:     Chest wall: No tenderness.  Abdominal:     General: Bowel sounds are normal.     Palpations: Abdomen is soft.  Musculoskeletal:        General: Normal range of motion.     Cervical back: Normal range of motion and neck supple.  Lymphadenopathy:     Cervical: No cervical adenopathy.  Skin:    General: Skin is warm and dry.  Neurological:     Mental Status: She is alert and oriented to person, place, and time.     Cranial Nerves: No cranial nerve deficit.  Psychiatric:        Behavior: Behavior normal.        Thought Content: Thought content normal.        Judgment: Judgment normal.        Assessment/Plan: 1. Type 2 diabetes mellitus with hyperglycemia, without long-term current use of insulin - POCT HgB A1Cis 7.0 which is up from 6.7 last visit. Will work on diet and exercise and consider restarting medication if not improving next check.  2. Benign hypertension Stable, continue current medications  3. Vitamin D deficiency - ergocalciferol (DRISDOL) 1.25 MG (50000 UT) capsule; Take one cap q week  Dispense: 12 capsule; Refill: 3   General  Counseling: Judy Graves verbalizes understanding of the findings of todays visit and agrees with plan of treatment. I have discussed any further diagnostic evaluation that may be needed or ordered today. We also reviewed her medications today. she has been encouraged to call the office with any questions or concerns that should arise related to todays visit.    Orders Placed This Encounter  Procedures   POCT HgB A1C    Meds ordered this encounter  Medications   ergocalciferol (DRISDOL) 1.25 MG (50000 UT) capsule    Sig: Take one cap q week    Dispense:  12 capsule    Refill:  3    This patient was seen by Drema Dallas, PA-C in collaboration with Dr. Clayborn Bigness as a part of collaborative care agreement.   Total time spent:30 Minutes Time spent includes review of chart, medications, test results, and follow up plan with the patient.      Dr Lavera Guise Internal medicine

## 2022-10-04 DIAGNOSIS — D485 Neoplasm of uncertain behavior of skin: Secondary | ICD-10-CM | POA: Diagnosis not present

## 2022-10-04 DIAGNOSIS — L720 Epidermal cyst: Secondary | ICD-10-CM | POA: Diagnosis not present

## 2022-10-04 DIAGNOSIS — R208 Other disturbances of skin sensation: Secondary | ICD-10-CM | POA: Diagnosis not present

## 2022-10-04 DIAGNOSIS — L538 Other specified erythematous conditions: Secondary | ICD-10-CM | POA: Diagnosis not present

## 2022-10-30 ENCOUNTER — Other Ambulatory Visit: Payer: Self-pay

## 2022-10-30 ENCOUNTER — Other Ambulatory Visit: Payer: Self-pay | Admitting: Physician Assistant

## 2022-10-30 DIAGNOSIS — I10 Essential (primary) hypertension: Secondary | ICD-10-CM

## 2022-11-30 DIAGNOSIS — Z20822 Contact with and (suspected) exposure to covid-19: Secondary | ICD-10-CM | POA: Diagnosis not present

## 2022-11-30 DIAGNOSIS — R519 Headache, unspecified: Secondary | ICD-10-CM | POA: Diagnosis not present

## 2022-11-30 DIAGNOSIS — J069 Acute upper respiratory infection, unspecified: Secondary | ICD-10-CM | POA: Diagnosis not present

## 2022-12-13 ENCOUNTER — Ambulatory Visit: Payer: Medicare Other | Admitting: Physician Assistant

## 2022-12-17 DIAGNOSIS — H43813 Vitreous degeneration, bilateral: Secondary | ICD-10-CM | POA: Diagnosis not present

## 2022-12-17 DIAGNOSIS — Z961 Presence of intraocular lens: Secondary | ICD-10-CM | POA: Diagnosis not present

## 2022-12-17 DIAGNOSIS — E119 Type 2 diabetes mellitus without complications: Secondary | ICD-10-CM | POA: Diagnosis not present

## 2022-12-27 ENCOUNTER — Ambulatory Visit (INDEPENDENT_AMBULATORY_CARE_PROVIDER_SITE_OTHER): Payer: Medicare Other | Admitting: Physician Assistant

## 2022-12-27 ENCOUNTER — Encounter: Payer: Self-pay | Admitting: Physician Assistant

## 2022-12-27 VITALS — BP 130/80 | HR 75 | Temp 97.9°F | Resp 16 | Ht 66.0 in | Wt 194.0 lb

## 2022-12-27 DIAGNOSIS — I10 Essential (primary) hypertension: Secondary | ICD-10-CM

## 2022-12-27 DIAGNOSIS — R079 Chest pain, unspecified: Secondary | ICD-10-CM | POA: Diagnosis not present

## 2022-12-27 DIAGNOSIS — E1165 Type 2 diabetes mellitus with hyperglycemia: Secondary | ICD-10-CM | POA: Diagnosis not present

## 2022-12-27 LAB — POCT GLYCOSYLATED HEMOGLOBIN (HGB A1C): Hemoglobin A1C: 7 % — AB (ref 4.0–5.6)

## 2022-12-27 MED ORDER — METOPROLOL SUCCINATE ER 50 MG PO TB24: 50.0000 mg | ORAL_TABLET | Freq: Every day | ORAL | 1 refills | Status: DC

## 2022-12-27 NOTE — Progress Notes (Signed)
[facility] [address] [place], [place] [address]  Internal MEDICINE  Office Visit Note  Patient Name: Judy Graves  [id number]  [id number]  Date of Service: 01/04/2023  Chief Complaint  Patient presents with   Follow-up   Diabetes   Gastroesophageal Reflux   Hypertension    HPI Pt is here for routine follow up -Declines shingles or PNA vaccines -every now and then has a sharp pain in lower chest, lasts maybe a few mins and goes away again and might come again. Happens at rest. No reflux. No palpitations, no SOB. Denies any anxiety or stress -not having it now, but will check EKG -Has not had this occur in the last 2-3 weeks  Current Medication: Outpatient Encounter Medications as of 12/27/2022  Medication Sig   aspirin (ASPIRIN LOW DOSE) 81 MG EC tablet Take 1 tablet (81 mg total) by mouth daily. Swallow whole.   atorvastatin (LIPITOR) 40 MG tablet TAKE 1 TABLET BY MOUTH EVERY DAY   chlorthalidone (HYGROTON) 25 MG tablet TAKE ONE TABLET BY MOUTH DAILY *INCREASE POTASSIUM INTAKE WITH DIET*   EPIPEN 2-PAK 0.3 MG/0.3ML SOAJ injection AS DIRECTED AS DIRECTED INJECTION 2   ergocalciferol (DRISDOL) 1.25 MG (50000 UT) capsule Take one cap q week   glucose blood (ACCU-CHEK GUIDE) test strip Use as instructed once daily. Dx e11.65   levothyroxine (SYNTHROID) 50 MCG tablet TAKE 1 TABLET DAILY AND 2 TABLETS ON SUNDAYS   [DISCONTINUED] metoprolol succinate (TOPROL-XL) 50 MG 24 hr tablet TAKE 1 TABLET BY MOUTH DAILY. TAKE WITH OR IMMEDIATELY FOLLOWING A MEAL.   [DISCONTINUED] Semaglutide (RYBELSUS) 3 MG TABS TAKE 1 TAB PO DAILY   metoprolol succinate (TOPROL-XL) 50 MG 24 hr tablet Take 1 tablet (50 mg total) by mouth daily. TAKE WITH OR IMMEDIATELY FOLLOWING A MEAL.   No facility-administered encounter medications on file as of 12/27/2022.    Surgical History: Past Surgical History:  Procedure Laterality Date   CARPAL TUNNEL RELEASE Right 2010   Dr. Hyacinth Meeker   CATARACT  EXTRACTION W/PHACO Left 11/02/2015   Procedure: CATARACT EXTRACTION PHACO AND INTRAOCULAR LENS PLACEMENT (IOC) left eye;  Surgeon: Lockie Mola, MD;  Location: [facility];  Service: Ophthalmology;  Laterality: Left;   CATARACT EXTRACTION W/PHACO Right 02/07/2022   Procedure: CATARACT EXTRACTION PHACO AND INTRAOCULAR LENS PLACEMENT (IOC) RIGHT DIABETIC 6.41 00:43.7;  Surgeon: Lockie Mola, MD;  Location: [facility];  Service: Ophthalmology;  Laterality: Right;  Diabetic   CHONDROPLASTY Left 11/14/2015   Procedure: CHONDROPLASTY;  Surgeon: Donato Heinz, MD;  Location: [facility];  Service: Orthopedics;  Laterality: Left;   KNEE ARTHROSCOPY WITH LATERAL MENISECTOMY Left 11/14/2015   Procedure: KNEE ARTHROSCOPY WITH LATERAL MENISECTOMY;  Surgeon: Donato Heinz, MD;  Location: [facility];  Service: Orthopedics;  Laterality: Left;   KNEE ARTHROSCOPY WITH MEDIAL MENISECTOMY Left 11/14/2015   Procedure: KNEE ARTHROSCOPY WITH MEDIAL MENISECTOMY;  Surgeon: Donato Heinz, MD;  Location: [facility];  Service: Orthopedics;  Laterality: Left;   TONSILLECTOMY     TUBAL LIGATION      Medical History: Past Medical History:  Diagnosis Date   Allergy    Arthritis    in knee   Chronic constipation    Chronic insomnia    Dental bridge present    Permanent - top - front   Diabetes mellitus without complication (HCC)    GERD (gastroesophageal reflux disease)    Hemorrhoids    Hypertension    Hypothyroidism, adult  Impingement syndrome of right shoulder    [facility] subacromial bursitis and tendinitis right shoulder   Lumbar spinal stenosis    Dr. Council Mechanic   Lump or mass in breast    Dr. Evette Cristal evaluated, likely lipoma   Menopause    Metabolic syndrome    Mild carpal tunnel syndrome of right wrist    Obesity (BMI 35.0-39.9 without comorbidity)    Proteinuria     Family History: Family History  Problem Relation Age of Onset   Cancer Mother        Breast    Diabetes Mother    Breast cancer Mother    Hypertension Father    CVA Father        105's   Hypertension Sister    Hypertension Brother    Breast cancer Paternal Aunt     Social History   Socioeconomic History   Marital status: Married    Spouse name: Not on file   Number of children: Not on file   Years of education: Not on file   Highest education level: Not on file  Occupational History   Not on file  Tobacco Use   Smoking status: Never   Smokeless tobacco: Never  Vaping Use   Vaping status: Never Used  Substance and Sexual Activity   Alcohol use: No    Alcohol/week: 0.0 standard drinks of alcohol   Drug use: No   Sexual activity: Yes    Partners: Male  Other Topics Concern   Not on file  Social History Narrative   Not on file   Social Determinants of Health   Financial Resource Strain: Not on file  Food Insecurity: Not on file  Transportation Needs: Not on file  Physical Activity: Not on file  Stress: Not on file  Social Connections: Not on file  Intimate Partner Violence: Not on file      Review of Systems  Constitutional:  Negative for chills, diaphoresis and fatigue.  HENT:  Negative for ear pain, postnasal drip and sinus pressure.   Eyes:  Negative for photophobia, discharge, redness, itching and visual disturbance.  Respiratory:  Negative for cough, shortness of breath and wheezing.   Cardiovascular:  Positive for chest pain. Negative for palpitations and leg swelling.  Gastrointestinal:  Negative for abdominal pain, constipation, diarrhea, nausea and vomiting.  Genitourinary:  Negative for dysuria and flank pain.  Musculoskeletal:  Negative for arthralgias, back pain, gait problem and neck pain.  Skin:  Negative for color change.  Allergic/Immunologic: Negative for environmental allergies and food allergies.  Neurological:  Negative for dizziness and headaches.  Hematological:  Does not bruise/bleed easily.  Psychiatric/Behavioral:  Negative for  agitation, behavioral problems (depression) and hallucinations. The patient is not nervous/anxious.     Vital Signs: BP 130/80   Pulse 75   Temp 97.9 F (36.6 C)   Resp 16   Ht 5\' 6"  (1.676 m)   Wt 194 lb (88 kg)   SpO2 94%   BMI 31.31 kg/m    Physical Exam Vitals and nursing note reviewed.  Constitutional:      General: She is not in acute distress.    Appearance: She is well-developed. She is obese. She is not diaphoretic.  HENT:     Head: Normocephalic and atraumatic.     Mouth/Throat:     Pharynx: No oropharyngeal exudate.  Eyes:     Pupils: Pupils are equal, round, and reactive to light.  Neck:     Thyroid: No  thyromegaly.     Vascular: No JVD.     Trachea: No tracheal deviation.  Cardiovascular:     Rate and Rhythm: Normal rate and regular rhythm.     Heart sounds: Normal heart sounds. No murmur heard.    No friction rub. No gallop.  Pulmonary:     Effort: Pulmonary effort is normal. No respiratory distress.     Breath sounds: No wheezing or rales.  Chest:     Chest wall: No tenderness.  Abdominal:     General: Bowel sounds are normal.     Palpations: Abdomen is soft.  Musculoskeletal:        General: Normal range of motion.     Cervical back: Normal range of motion and neck supple.  Lymphadenopathy:     Cervical: No cervical adenopathy.  Skin:    General: Skin is warm and dry.  Neurological:     Mental Status: She is alert and oriented to person, place, and time.     Cranial Nerves: No cranial nerve deficit.  Psychiatric:        Behavior: Behavior normal.        Thought Content: Thought content normal.        Judgment: Judgment normal.        Assessment/Plan: 1. Type 2 diabetes mellitus with hyperglycemia, without long-term current use of insulin (HCC) - POCT HgB A1C is 7.0 which is stable, continue to control with diet and exercise. Pt aware if this rises any then medication will need to be restarted  2. Benign hypertension - metoprolol  succinate (TOPROL-XL) 50 MG 24 hr tablet; Take 1 tablet (50 mg total) by mouth daily. TAKE WITH OR IMMEDIATELY FOLLOWING A MEAL.  Dispense: 90 tablet; Refill: 1  3. Chest pain, unspecified type Will refer to cardiology for further evaluation of atypical CP at rest. Pt advised to go to ED for any new or worsening symptoms - EKG 12-Lead - Ambulatory referral to Cardiology   General Counseling: winona serlin understanding of the findings of todays visit and agrees with plan of treatment. I have discussed any further diagnostic evaluation that may be needed or ordered today. We also reviewed her medications today. she has been encouraged to call the office with any questions or concerns that should arise related to todays visit.    Orders Placed This Encounter  Procedures   Ambulatory referral to Cardiology   POCT HgB A1C   EKG 12-Lead    Meds ordered this encounter  Medications   metoprolol succinate (TOPROL-XL) 50 MG 24 hr tablet    Sig: Take 1 tablet (50 mg total) by mouth daily. TAKE WITH OR IMMEDIATELY FOLLOWING A MEAL.    Dispense:  90 tablet    Refill:  1    This patient was seen by Lynn Ito, PA-C in collaboration with Dr. Beverely Risen as a part of collaborative care agreement.   Total time spent:45 Minutes Time spent includes review of chart, medications, test results, and follow up plan with the patient.      Dr Lyndon Code Internal medicine

## 2023-01-23 ENCOUNTER — Ambulatory Visit (INDEPENDENT_AMBULATORY_CARE_PROVIDER_SITE_OTHER): Payer: Medicare Other

## 2023-01-23 DIAGNOSIS — Z23 Encounter for immunization: Secondary | ICD-10-CM | POA: Diagnosis not present

## 2023-03-13 ENCOUNTER — Other Ambulatory Visit: Payer: Self-pay | Admitting: Physician Assistant

## 2023-03-13 DIAGNOSIS — E038 Other specified hypothyroidism: Secondary | ICD-10-CM

## 2023-05-16 ENCOUNTER — Encounter: Payer: Self-pay | Admitting: Physician Assistant

## 2023-05-16 ENCOUNTER — Other Ambulatory Visit: Payer: Self-pay | Admitting: Physician Assistant

## 2023-05-16 ENCOUNTER — Ambulatory Visit (INDEPENDENT_AMBULATORY_CARE_PROVIDER_SITE_OTHER): Payer: Medicare Other | Admitting: Physician Assistant

## 2023-05-16 VITALS — BP 103/70 | HR 87 | Temp 98.6°F | Resp 16 | Ht 66.0 in | Wt 196.0 lb

## 2023-05-16 DIAGNOSIS — E785 Hyperlipidemia, unspecified: Secondary | ICD-10-CM

## 2023-05-16 DIAGNOSIS — Z0000 Encounter for general adult medical examination without abnormal findings: Secondary | ICD-10-CM

## 2023-05-16 DIAGNOSIS — E1165 Type 2 diabetes mellitus with hyperglycemia: Secondary | ICD-10-CM

## 2023-05-16 DIAGNOSIS — E038 Other specified hypothyroidism: Secondary | ICD-10-CM | POA: Diagnosis not present

## 2023-05-16 DIAGNOSIS — E782 Mixed hyperlipidemia: Secondary | ICD-10-CM | POA: Diagnosis not present

## 2023-05-16 DIAGNOSIS — Z13228 Encounter for screening for other metabolic disorders: Secondary | ICD-10-CM | POA: Diagnosis not present

## 2023-05-16 DIAGNOSIS — Z0001 Encounter for general adult medical examination with abnormal findings: Secondary | ICD-10-CM | POA: Diagnosis not present

## 2023-05-16 DIAGNOSIS — Z1231 Encounter for screening mammogram for malignant neoplasm of breast: Secondary | ICD-10-CM

## 2023-05-16 DIAGNOSIS — I10 Essential (primary) hypertension: Secondary | ICD-10-CM

## 2023-05-16 DIAGNOSIS — R6889 Other general symptoms and signs: Secondary | ICD-10-CM | POA: Diagnosis not present

## 2023-05-16 DIAGNOSIS — E559 Vitamin D deficiency, unspecified: Secondary | ICD-10-CM | POA: Diagnosis not present

## 2023-05-16 LAB — POCT GLYCOSYLATED HEMOGLOBIN (HGB A1C): Hemoglobin A1C: 8.2 % — AB (ref 4.0–5.6)

## 2023-05-16 MED ORDER — LEVOTHYROXINE SODIUM 50 MCG PO TABS
ORAL_TABLET | ORAL | 1 refills | Status: DC
Start: 2023-05-16 — End: 2023-09-23

## 2023-05-16 MED ORDER — ATORVASTATIN CALCIUM 40 MG PO TABS
40.0000 mg | ORAL_TABLET | Freq: Every day | ORAL | 3 refills | Status: DC
Start: 2023-05-16 — End: 2023-09-23

## 2023-05-16 MED ORDER — CHLORTHALIDONE 25 MG PO TABS: ORAL_TABLET | ORAL | 3 refills | Status: DC

## 2023-05-16 MED ORDER — EMPAGLIFLOZIN 10 MG PO TABS
10.0000 mg | ORAL_TABLET | Freq: Every day | ORAL | 3 refills | Status: DC
Start: 2023-05-16 — End: 2023-05-30

## 2023-05-16 MED ORDER — METOPROLOL SUCCINATE ER 50 MG PO TB24
50.0000 mg | ORAL_TABLET | Freq: Every day | ORAL | 1 refills | Status: DC
Start: 2023-05-16 — End: 2023-09-23

## 2023-05-16 NOTE — Progress Notes (Signed)
 [facility] [address] [place], [place] [address]  Internal MEDICINE  Office Visit Note  Patient Name: Judy Graves  [id number]  [id number]  Date of Service: 05/16/2023  Chief Complaint  Patient presents with   Medicare Wellness   Diabetes   Gastroesophageal Reflux   Hypertension   Medication Refill    Levothyroxine , Metoprolol , Atorvastatin , Chlorthalidone     HPI Kimberlea presents for an annual well visit Well-appearing 68 y.o. female  Routine CRC screening: UTD on cologuard, due 2026 Routine mammogram: Due for mammogram in Feb--ordered DEXA scan: UTD 2024 Eye exam and/or foot exam: eye exam due in Aug, foot exam done today Labs: lab slip given -has not been checking BG recently and will start back with this. A1c elevated today and will restart Jardiance  in AM -BP on low side in office but pt denies any symptoms. Will monitor at home. Has not had anything to eat or drink yet today     05/16/2023    9:00 AM  MMSE - Mini Mental State Exam  Orientation to time 5  Orientation to Place 5  Registration 3  Attention/ Calculation 5  Recall 3  Language- name 2 objects 2  Language- repeat 1  Language- follow 3 step command 3  Language- read & follow direction 1  Write a sentence 1  Copy design 1  Total score 30    Functional Status Survey: Is the patient deaf or have difficulty hearing?: No Does the patient have difficulty seeing, even when wearing glasses/contacts?: No Does the patient have difficulty concentrating, remembering, or making decisions?: No Does the patient have difficulty walking or climbing stairs?: No Does the patient have difficulty dressing or bathing?: No Does the patient have difficulty doing errands alone such as visiting a doctor's office or shopping?: No     02/07/2022   10:19 AM 05/10/2022   10:57 AM 08/09/2022   11:29 AM 12/27/2022    3:24 PM 05/16/2023    8:59 AM  Fall Risk  Falls in the past year?  0 0 0 0  (RETIRED) Patient Fall  Risk Level Moderate fall risk           05/16/2023    8:59 AM  Depression screen PHQ 2/9  Decreased Interest 0  Down, Depressed, Hopeless 0  PHQ - 2 Score 0        No data to display            Current Medication: Outpatient Encounter Medications as of 05/16/2023  Medication Sig   aspirin  (ASPIRIN  LOW DOSE) 81 MG EC tablet Take 1 tablet (81 mg total) by mouth daily. Swallow whole.   empagliflozin  (JARDIANCE ) 10 MG TABS tablet Take 1 tablet (10 mg total) by mouth daily before breakfast.   EPIPEN  2-PAK 0.3 MG/0.3ML SOAJ injection AS DIRECTED AS DIRECTED INJECTION 2   ergocalciferol  (DRISDOL ) 1.25 MG (50000 UT) capsule Take one cap q week   glucose blood (ACCU-CHEK GUIDE) test strip Use as instructed once daily. Dx e11.65   [DISCONTINUED] atorvastatin  (LIPITOR) 40 MG tablet TAKE 1 TABLET BY MOUTH EVERY DAY   [DISCONTINUED] chlorthalidone  (HYGROTON ) 25 MG tablet TAKE ONE TABLET BY MOUTH DAILY *INCREASE POTASSIUM INTAKE WITH DIET*   [DISCONTINUED] levothyroxine  (SYNTHROID ) 50 MCG tablet TAKE 1 TABLET DAILY AND 2 TABLETS ON SUNDAYS   [DISCONTINUED] metoprolol  succinate (TOPROL -XL) 50 MG 24 hr tablet Take 1 tablet (50 mg total) by mouth daily. TAKE WITH OR IMMEDIATELY FOLLOWING A MEAL.   atorvastatin  (LIPITOR) 40  MG tablet Take 1 tablet (40 mg total) by mouth daily.   chlorthalidone  (HYGROTON ) 25 MG tablet TAKE ONE TABLET BY MOUTH DAILY *INCREASE POTASSIUM INTAKE WITH DIET*   levothyroxine  (SYNTHROID ) 50 MCG tablet TAKE 1 TABLET DAILY AND 2 TABLETS ON SUNDAYS   metoprolol succinate (TOPROL-XL) 50 MG 24 hr tablet Take 1 tablet (50 mg total) by mouth daily. TAKE WITH OR IMMEDIATELY FOLLOWING A MEAL.   No facility-administered encounter medications on file as of 05/16/2023.    Surgical History: Past Surgical History:  Procedure Laterality Date   CARPAL TUNNEL RELEASE Right 2010   Dr. Miller   CATARACT EXTRACTION W/PHACO Left 11/02/2015   Procedure: CATARACT EXTRACTION PHACO AND  INTRAOCULAR LENS PLACEMENT (IOC) left eye;  Surgeon: Chadwick Brasington, MD;  Location: [facility];  Service: Ophthalmology;  Laterality: Left;   CATARACT EXTRACTION W/PHACO Right 02/07/2022   Procedure: CATARACT EXTRACTION PHACO AND INTRAOCULAR LENS PLACEMENT (IOC) RIGHT DIABETIC 6.41 00:43.7;  Surgeon: Brasington, Chadwick, MD;  Location: [facility];  Service: Ophthalmology;  Laterality: Right;  Diabetic   CHONDROPLASTY Left 11/14/2015   Procedure: CHONDROPLASTY;  Surgeon: James P Hooten, MD;  Location: [facility];  Service: Orthopedics;  Laterality: Left;   KNEE ARTHROSCOPY WITH LATERAL MENISECTOMY Left 11/14/2015   Procedure: KNEE ARTHROSCOPY WITH LATERAL MENISECTOMY;  Surgeon: James P Hooten, MD;  Location: [facility];  Service: Orthopedics;  Laterality: Left;   KNEE ARTHROSCOPY WITH MEDIAL MENISECTOMY Left 11/14/2015   Procedure: KNEE ARTHROSCOPY WITH MEDIAL MENISECTOMY;  Surgeon: James P Hooten, MD;  Location: [facility];  Service: Orthopedics;  Laterality: Left;   TONSILLECTOMY     TUBAL LIGATION      Medical History: Past Medical History:  Diagnosis Date   Allergy    Arthritis    in knee   Chronic constipation    Chronic insomnia    Dental bridge present    Permanent - top - front   Diabetes mellitus without complication (HCC)    GERD (gastroesophageal reflux disease)    Hemorrhoids    Hypertension    Hypothyroidism, adult    Impingement syndrome of right shoulder    [facility] subacromial bursitis and tendinitis right shoulder   Lumbar spinal stenosis    Dr. Chasniss   Lump or mass in breast    Dr. Sankar evaluated, likely lipoma   Menopause    Metabolic syndrome    Mild carpal tunnel syndrome of right wrist    Obesity (BMI 35.0-39.9 without comorbidity)    Proteinuria     Family History: Family History  Problem Relation Age of Onset   Cancer Mother        Breast   Diabetes Mother    Breast cancer Mother    Hypertension Father    CVA Father         80 's   Hypertension Sister    Hypertension Brother    Breast cancer Paternal Aunt     Social History   Socioeconomic History   Marital status: Married    Spouse name: Not on file   Number of children: Not on file   Years of education: Not on file   Highest education level: Not on file  Occupational History   Not on file  Tobacco Use   Smoking status: Never   Smokeless tobacco: Never  Vaping Use   Vaping status: Never Used  Substance and Sexual Activity   Alcohol use: No    Alcohol/week: 0.0 standard drinks of alcohol  Drug use: No   Sexual activity: Yes    Partners: Male  Other Topics Concern   Not on file  Social History Narrative   Not on file   Social Drivers of Health   Financial Resource Strain: Not on file  Food Insecurity: Not on file  Transportation Needs: Not on file  Physical Activity: Not on file  Stress: Not on file  Social Connections: Not on file  Intimate Partner Violence: Not on file      Review of Systems  Constitutional:  Negative for chills, diaphoresis and fatigue.  HENT:  Negative for ear pain, postnasal drip and sinus pressure.   Eyes:  Negative for photophobia, discharge, redness, itching and visual disturbance.  Respiratory:  Negative for cough, shortness of breath and wheezing.   Cardiovascular:  Negative for chest pain, palpitations and leg swelling.  Gastrointestinal:  Negative for abdominal pain, constipation, diarrhea, nausea and vomiting.  Genitourinary:  Negative for dysuria and flank pain.  Musculoskeletal:  Negative for back pain, gait problem and neck pain.  Skin:  Negative for color change.  Allergic/Immunologic: Negative for environmental allergies and food allergies.  Neurological:  Negative for dizziness and headaches.  Hematological:  Does not bruise/bleed easily.  Psychiatric/Behavioral:  Negative for agitation, behavioral problems (depression) and hallucinations.     Vital Signs: BP 103/70   Pulse 87    Temp 98.6 F (37 C)   Resp 16   Ht 5' 6 (1.676 m)   Wt 196 lb (88.9 kg)   SpO2 96%   BMI 31.64 kg/m    Physical Exam Vitals and nursing note reviewed.  Constitutional:      Appearance: Normal appearance.  HENT:     Head: Normocephalic and atraumatic.  Eyes:     Pupils: Pupils are equal, round, and reactive to light.  Cardiovascular:     Rate and Rhythm: Normal rate and regular rhythm.  Pulmonary:     Effort: Pulmonary effort is normal.     Breath sounds: Normal breath sounds.  Neurological:     General: No focal deficit present.     Mental Status: She is alert.  Psychiatric:        Mood and Affect: Mood normal.        Behavior: Behavior normal.        Assessment/Plan: 1. Encounter for annual wellness exam in Medicare patient (Primary) AWV performed, declines vaccines, due for mammogram  2. Type 2 diabetes mellitus with hyperglycemia, without long-term current use of insulin (HCC) - Urine Microalbumin w/creat. ratio - POCT HgB A1C is 8.2 which is up from 7 last check. Will restart on jardiance  daily and monitor BG at home - empagliflozin  (JARDIANCE ) 10 MG TABS tablet; Take 1 tablet (10 mg total) by mouth daily before breakfast.  Dispense: 30 tablet; Refill: 3  3. Benign hypertension Borderline low in office, has not had anything to eat or drink yet and will monitor. Denies any symptoms - metoprolol  succinate (TOPROL -XL) 50 MG 24 hr tablet; Take 1 tablet (50 mg total) by mouth daily. TAKE WITH OR IMMEDIATELY FOLLOWING A MEAL.  Dispense: 90 tablet; Refill: 1 - chlorthalidone  (HYGROTON ) 25 MG tablet; TAKE ONE TABLET BY MOUTH DAILY *INCREASE POTASSIUM INTAKE WITH DIET*  Dispense: 90 tablet; Refill: 3  4. Other specified hypothyroidism Will check labs, refill sent - levothyroxine  (SYNTHROID ) 50 MCG tablet; TAKE 1 TABLET DAILY AND 2 TABLETS ON SUNDAYS  Dispense: 102 tablet; Refill: 1  5. Dyslipidemia Will check labs, continue lipitor -  atorvastatin  (LIPITOR) 40 MG  tablet; Take 1 tablet (40 mg total) by mouth daily.  Dispense: 90 tablet; Refill: 3  6. Visit for screening mammogram - MM 3D SCREENING MAMMOGRAM BILATERAL BREAST; Future     General Counseling: Asyria verbalizes understanding of the findings of todays visit and agrees with plan of treatment. I have discussed any further diagnostic evaluation that may be needed or ordered today. We also reviewed her medications today. she has been encouraged to call the office with any questions or concerns that should arise related to todays visit.    Orders Placed This Encounter  Procedures   MM 3D SCREENING MAMMOGRAM BILATERAL BREAST   Urine Microalbumin w/creat. ratio   POCT HgB A1C    Meds ordered this encounter  Medications   levothyroxine  (SYNTHROID ) 50 MCG tablet    Sig: TAKE 1 TABLET DAILY AND 2 TABLETS ON SUNDAYS    Dispense:  102 tablet    Refill:  1   metoprolol  succinate (TOPROL -XL) 50 MG 24 hr tablet    Sig: Take 1 tablet (50 mg total) by mouth daily. TAKE WITH OR IMMEDIATELY FOLLOWING A MEAL.    Dispense:  90 tablet    Refill:  1   atorvastatin  (LIPITOR) 40 MG tablet    Sig: Take 1 tablet (40 mg total) by mouth daily.    Dispense:  90 tablet    Refill:  3   chlorthalidone  (HYGROTON ) 25 MG tablet    Sig: TAKE ONE TABLET BY MOUTH DAILY *INCREASE POTASSIUM INTAKE WITH DIET*    Dispense:  90 tablet    Refill:  3   empagliflozin  (JARDIANCE ) 10 MG TABS tablet    Sig: Take 1 tablet (10 mg total) by mouth daily before breakfast.    Dispense:  30 tablet    Refill:  3    Return in about 4 months (around 09/13/2023) for A1c.   Total time spent:35 Minutes Time spent includes review of chart, medications, test results, and follow up plan with the patient.   [place] Controlled Substance Database was reviewed by me.  This patient was seen by Tinnie Pro, PA-C in collaboration with Dr. Sigrid Bathe as a part of collaborative care agreement.  Tinnie Pro, PA-C Internal medicine

## 2023-05-17 LAB — CBC WITH DIFFERENTIAL/PLATELET
Basophils Absolute: 0 10*3/uL (ref 0.0–0.2)
Basos: 0 %
EOS (ABSOLUTE): 0.1 10*3/uL (ref 0.0–0.4)
Eos: 1 %
Hematocrit: 41.6 % (ref 34.0–46.6)
Hemoglobin: 14.1 g/dL (ref 11.1–15.9)
Immature Grans (Abs): 0 10*3/uL (ref 0.0–0.1)
Immature Granulocytes: 0 %
Lymphocytes Absolute: 3 10*3/uL (ref 0.7–3.1)
Lymphs: 37 %
MCH: 31.2 pg (ref 26.6–33.0)
MCHC: 33.9 g/dL (ref 31.5–35.7)
MCV: 92 fL (ref 79–97)
Monocytes Absolute: 0.8 10*3/uL (ref 0.1–0.9)
Monocytes: 10 %
Neutrophils Absolute: 4.3 10*3/uL (ref 1.4–7.0)
Neutrophils: 52 %
Platelets: 213 10*3/uL (ref 150–450)
RBC: 4.52 x10E6/uL (ref 3.77–5.28)
RDW: 12.1 % (ref 11.7–15.4)
WBC: 8.2 10*3/uL (ref 3.4–10.8)

## 2023-05-17 LAB — COMPREHENSIVE METABOLIC PANEL
ALT: 28 [IU]/L (ref 0–32)
AST: 18 [IU]/L (ref 0–40)
Albumin: 3.9 g/dL (ref 3.9–4.9)
Alkaline Phosphatase: 88 [IU]/L (ref 44–121)
BUN/Creatinine Ratio: 13 (ref 12–28)
BUN: 14 mg/dL (ref 8–27)
Bilirubin Total: 0.6 mg/dL (ref 0.0–1.2)
CO2: 29 mmol/L (ref 20–29)
Calcium: 9.3 mg/dL (ref 8.7–10.3)
Chloride: 99 mmol/L (ref 96–106)
Creatinine, Ser: 1.11 mg/dL — ABNORMAL HIGH (ref 0.57–1.00)
Globulin, Total: 2.7 g/dL (ref 1.5–4.5)
Glucose: 178 mg/dL — ABNORMAL HIGH (ref 70–99)
Potassium: 3.9 mmol/L (ref 3.5–5.2)
Sodium: 140 mmol/L (ref 134–144)
Total Protein: 6.6 g/dL (ref 6.0–8.5)
eGFR: 55 mL/min/{1.73_m2} — ABNORMAL LOW (ref >59)

## 2023-05-17 LAB — TSH: TSH: 2.56 u[IU]/mL (ref 0.450–4.500)

## 2023-05-17 LAB — B12 AND FOLATE PANEL
Folate: 12 ng/mL (ref >3.0)
Vitamin B-12: 613 pg/mL (ref 232–1245)

## 2023-05-17 LAB — LIPID PANEL WITH LDL/HDL RATIO
Cholesterol, Total: 159 mg/dL (ref 100–199)
HDL: 44 mg/dL (ref >39)
LDL Chol Calc (NIH): 95 mg/dL (ref 0–99)
LDL/HDL Ratio: 2.2 {ratio} (ref 0.0–3.2)
Triglycerides: 109 mg/dL (ref 0–149)
VLDL Cholesterol Cal: 20 mg/dL (ref 5–40)

## 2023-05-17 LAB — VITAMIN D 25 HYDROXY (VIT D DEFICIENCY, FRACTURES): Vit D, 25-Hydroxy: 35.6 ng/mL (ref 30.0–100.0)

## 2023-05-17 LAB — T4, FREE: Free T4: 1.6 ng/dL (ref 0.82–1.77)

## 2023-05-19 LAB — MICROALBUMIN / CREATININE URINE RATIO
Creatinine, Urine: 302.1 mg/dL
Microalb/Creat Ratio: 15 mg/g{creat} (ref 0–29)
Microalbumin, Urine: 44.2 ug/mL

## 2023-05-22 ENCOUNTER — Telehealth: Payer: Self-pay

## 2023-05-22 NOTE — Telephone Encounter (Signed)
 Spoke with patient regarding lab results.

## 2023-05-22 NOTE — Telephone Encounter (Signed)
-----   Message from Jacques Mattock sent at 05/21/2023 10:27 AM EST ----- Please let pt know that her kidney function is slightly reduced but overall stable from previous labs, cholesterol slightly improved from last year, Vit D now in normal range but still on lower end of normal and may consider OTC daily supplement instead of weekly drisdol  now.

## 2023-05-30 ENCOUNTER — Other Ambulatory Visit: Payer: Self-pay | Admitting: Physician Assistant

## 2023-05-30 DIAGNOSIS — E1165 Type 2 diabetes mellitus with hyperglycemia: Secondary | ICD-10-CM

## 2023-05-30 MED ORDER — METFORMIN HCL ER 500 MG PO TB24: 500.0000 mg | ORAL_TABLET | Freq: Every day | ORAL | 2 refills | Status: DC

## 2023-06-24 ENCOUNTER — Other Ambulatory Visit: Payer: Self-pay

## 2023-06-24 DIAGNOSIS — Z79899 Other long term (current) drug therapy: Secondary | ICD-10-CM

## 2023-06-24 NOTE — Progress Notes (Addendum)
 06/24/2023 Name: AMBERA FEDELE MRN: [id number] DOB: [birth year]  Chief Complaint  Patient presents with   Diabetes    [employer]    Judy Graves is a 68 y.o. year old female who presented for a telephone visit.   They were referred to the pharmacist by a quality report for assistance in managing diabetes.    Subjective:  Care Team: Primary Care Provider: Carlean Jews, PA-C ; Next Scheduled Visit: 09/16/2023  Medication Access/Adherence  Current Pharmacy:  [phone] [address], [place] - [address] [place] [address] Phone: [phone] Fax: [phone]   Patient reports affordability concerns with their medications: Yes  Yes - about $200 London Pepper and Marcelline Deist and PCP switched to metformin Patient reports access/transportation concerns to their pharmacy: No  Patient reports adherence concerns with their medications:  Yes     Diabetes:  Current medications: Metformin- XR 500 mg once daily before breakfast (tolerating mostly well) Medications tried in the past: [place] (cost), Jardiance (cost), Rybelsus (cost)  Current glucose readings:  130-140 (fasting only)  Using glucometer; testing about twice weekly. Discussed 3-4 times per week   Patient denies hypoglycemic s/sx including dizziness, shakiness, sweating. Patient denies hyperglycemic symptoms including polyuria, polydipsia, polyphagia, nocturia, neuropathy, blurred vision.  Current meal patterns (low appetite most days):  - Breakfast: 1 egg, slice of bacon; cereal - Lunch: Skips usually - Supper: salad, chicken - Snacks: apple, banana, "nabs"  - Drinks: Diet Juice, diet green tea, water, Gatorade zero  Current physical activity: seated chair to move her legs ('cubic chair'); Daily 15-30 minutes (and sometimes in the afternoon)  Current medication access support: N/A   Objective:  Lab Results  Component Value Date   HGBA1C 8.2 (A) 05/16/2023    Lab Results   Component Value Date   CREATININE 1.11 (H) 05/16/2023   BUN 14 05/16/2023   NA 140 05/16/2023   K 3.9 05/16/2023   CL 99 05/16/2023   CO2 29 05/16/2023    Lab Results  Component Value Date   CHOL 159 05/16/2023   HDL 44 05/16/2023   LDLCALC 95 05/16/2023   TRIG 109 05/16/2023   CHOLHDL 3.0 06/14/2015    Medications Reviewed Today     Reviewed by Katha Cabal, RPH (Pharmacist) on 06/24/23 at 1133  Med List Status: <None>   Medication Order Taking? Sig Documenting Provider Last Dose Status Informant  aspirin (ASPIRIN LOW DOSE) 81 MG EC tablet [id number] Yes Take 1 tablet (81 mg total) by mouth daily. Swallow whole. Lyndon Code, MD Taking Active   atorvastatin (LIPITOR) 40 MG tablet [id number] Yes Take 1 tablet (40 mg total) by mouth daily. McDonough, Salomon Fick, PA-C Taking Active   chlorthalidone (HYGROTON) 25 MG tablet [id number] Yes TAKE ONE TABLET BY MOUTH DAILY *INCREASE POTASSIUM INTAKE WITH DIET* McDonough, Lauren K, PA-C Taking Active   EPIPEN 2-PAK 0.3 MG/0.3ML SOAJ injection [id number]  AS DIRECTED AS DIRECTED INJECTION 2 [provider]  Active Self           Med Note Joaquin Music   Mon Oct 31, 2015  1:59 PM)    ergocalciferol (DRISDOL) 1.25 MG (50000 UT) capsule [id number] No Take one cap q week  Patient not taking: Reported on 06/24/2023   Carlean Jews, PA-C Not Taking Active            Med Note Katha Cabal   Mon Jun 24, 2023 11:32 AM) HELD  per labs  glucose blood (ACCU-CHEK GUIDE) test strip [id number] Yes Use as instructed once daily. Dx e11.65 Carlean Jews, PA-C Taking Active   levothyroxine (SYNTHROID) 50 MCG tablet [id number] Yes TAKE 1 TABLET DAILY AND 2 TABLETS ON SUNDAYS McDonough, Lauren K, PA-C Taking Active   metFORMIN (GLUCOPHAGE-XR) 500 MG 24 hr tablet [id number] Yes Take 1 tablet (500 mg total) by mouth daily with breakfast. McDonough, Salomon Fick, PA-C Taking Active   metoprolol succinate (TOPROL-XL) 50 MG 24 hr tablet  [id number] Yes Take 1 tablet (50 mg total) by mouth daily. TAKE WITH OR IMMEDIATELY FOLLOWING A MEAL. McDonough, Salomon Fick, PA-C Taking Active               Assessment/Plan:    Diabetes: - Currently uncontrolled. Goals: A1c <7%, fasting <130 mg/dL, and 2-hr post prandial blood glucose <180 mg/dL. The patient's hemoglobin A1c has historically been well-controlled. She has tried several medications, but due to cost, she has been unable to continue them. Recently, her A1c increased, and Jardiance was initiated but later switched to metformin due to cost concerns. Although she qualifies for medication assistance for Jardiance and Marcelline Deist based on her reported household income for two people, she declined the support. She was educated on how Patient Assistance Programs (PAP) work. Mrs. Durrett remains content on metformin.   - Reviewed long term cardiovascular and renal outcomes of uncontrolled blood sugar. Patient is not on ACE inhibitor d/t documented angioedema to benazepril that was combined therapy regimen.  - Reviewed goal A1c, goal fasting, and goal 2 hour post prandial glucose - Reviewed dietary modifications including: No concerns  - Reviewed lifestyle modifications including: 150 min/week of moderate exercise - Monitor eGFR while on metformin. Recommend to consider alternative therapy for renal and/or cardiovascular benefits that offers some weight loss benefits.  - Recommend to check glucose 3 times per week, with the goal of twice daily, and continue journaling readings    Total time spent: 27 minutes   Follow Up Plan: 4-6 weeks   Thank you for allowing pharmacy to be a part of this patient's care. Cephus Shelling, PharmD Clinical Pharmacist Cell: [phone]

## 2023-07-02 ENCOUNTER — Ambulatory Visit
Admission: RE | Admit: 2023-07-02 | Discharge: 2023-07-02 | Disposition: A | Discharge status: Home / Self Care | Payer: Medicare Other | Source: Ambulatory Visit | Attending: Physician Assistant | Admitting: Physician Assistant

## 2023-07-02 DIAGNOSIS — Z1231 Encounter for screening mammogram for malignant neoplasm of breast: Secondary | ICD-10-CM | POA: Diagnosis not present

## 2023-07-20 ENCOUNTER — Other Ambulatory Visit: Payer: Self-pay | Admitting: Physician Assistant

## 2023-07-20 DIAGNOSIS — E1165 Type 2 diabetes mellitus with hyperglycemia: Secondary | ICD-10-CM

## 2023-07-22 ENCOUNTER — Other Ambulatory Visit: Payer: Self-pay

## 2023-07-22 DIAGNOSIS — E1165 Type 2 diabetes mellitus with hyperglycemia: Secondary | ICD-10-CM

## 2023-07-22 NOTE — Progress Notes (Signed)
 07/22/2023 Name: Judy Graves MRN: [id number] DOB: Mar 29, 1956  Chief Complaint  Patient presents with   Diabetes    Judy Graves is a 68 y.o. year old female who presented for a telephone visit.   They were referred to the pharmacist by a quality report for assistance in managing diabetes.    Subjective:  Care Team: Primary Care Provider: Alan Ripper ; Next Scheduled Visit: 09/16/2023 {careteamprovider:[id number]}  Medication Access/Adherence  Current Pharmacy:  [id number] [address], [place] - [address] [place] [address] Phone: [phone] Fax: [phone]   Patient reports affordability concerns with their medications: No  Patient reports access/transportation concerns to their pharmacy: No  Patient reports adherence concerns with their medications:  No     Diabetes:  Current medications: metformin XR 500 mg once daily  Medications tried in the past:   Current glucose readings: She is not checking  Using glucometer; testing 0 times daily. She reports that the test strips are too expensive and they are   Patient denies hypoglycemic s/sx including dizziness, shakiness, sweating. Patient denies hyperglycemic symptoms including polyuria, polydipsia, polyphagia, nocturia, neuropathy, blurred vision.  Current meal patterns:  - Breakfast: bacon, ham, eggs, grits - Lunch: Skips - Supper: sandwich - Snacks: candy (one piece almost daily) - Drinks green tea and water  Current physical activity:  seated chair to move her legs ('cubic chair'); Daily 15-30 minutes (and sometimes in the afternoon)   Hypertension:  Current medications: chlorthalidone 25 mg daily, metoprolol XL  Medications previously tried:   Patient has a validated, automated, upper arm home BP cuff, per patient report, that works yet she chooses not to check her blood glucose.  Current blood pressure readings readings: N/A  Patient denies hypotensive s/sx  including dizziness, lightheadedness.  Patient denies hypertensive symptoms including headache, chest pain, shortness of breath   Hyperlipidemia/ASCVD Risk Reduction  Current lipid lowering medications:  Medications tried in the past:   The 10-year ASCVD risk score (Arnett DK, et al., 2019) is: 12.3%   Values used to calculate the score:     Age: 31 years     Sex: Female     Is Non-Hispanic African American: Yes     Diabetic: Yes     Tobacco smoker: No     Systolic Blood Pressure: 103 mmHg     Is BP treated: Yes     HDL Cholesterol: 44 mg/dL     Total Cholesterol: 159 mg/dL    Objective:  Lab Results  Component Value Date   HGBA1C 8.2 (A) 05/16/2023    Lab Results  Component Value Date   CREATININE 1.11 (H) 05/16/2023   BUN 14 05/16/2023   NA 140 05/16/2023   K 3.9 05/16/2023   CL 99 05/16/2023   CO2 29 05/16/2023    Lab Results  Component Value Date   CHOL 159 05/16/2023   HDL 44 05/16/2023   LDLCALC 95 05/16/2023   TRIG 109 05/16/2023   CHOLHDL 3.0 06/14/2015    Medications Reviewed Today   Medications were not reviewed in this encounter       Assessment/Plan:   Diabetes: - Currently uncontrolled. She reports that she does not check her blood glucose and is not sure what would motivate her to regularly check it. She initially stated that she just doesn't feel the need to check her BG, then said the test strips were too expensive - Reviewed long term cardiovascular and renal  outcomes of uncontrolled blood sugar - Reviewed goal A1c, goal fasting, and goal 2 hour post prandial glucose - Recommend to ***  - Patient denies personal or family history of multiple endocrine neoplasia type 2, medullary thyroid cancer; personal history of pancreatitis or gallbladder disease. - Recommend to check glucose ***      Hypertension: - Currently controlled per last office visit recorded BP.  - Reviewed long term cardiovascular and renal outcomes of uncontrolled  blood pressure - Reviewed appropriate blood pressure monitoring technique and reviewed goal blood pressure. Recommended to check home blood pressure and heart rate *** - Recommend to ***      Hyperlipidemia/ASCVD Risk Reduction: - Currently controlled - Reviewed long term complications of uncontrolled cholesterol - Reviewed dietary recommendations including *** - Reviewed lifestyle recommendations including *** - Recommend to ***  - Meets financial criteria for *** patient assistance program through ***. Will collaborate with provider, CPhT, and patient to pursue assistance.    Follow Up Plan: ***  *** To do: test strop Once Touch (the one that was given); does not ceck BP at home as she feela likw she does not nned to

## 2023-07-25 NOTE — Patient Instructions (Signed)
 Ms. Leeder,   Check your blood sugars 3 to 4 times weekly (for now):  1) Fasting, first thing in the morning before breakfast and  2) 2 hours after your largest meal.   For a goal A1c of less than 7%, goal fasting readings are less than 130 and goal 2 hour after meal readings are less than 180.    Check your blood pressure  3 to 4 times weekly , and any time you have concerning symptoms like headache, chest pain, dizziness, shortness of breath, or vision changes.   Our goal is less than 130/80.  To appropriately check your blood pressure, make sure you do the following:  1) Avoid caffeine, exercise, or tobacco products for 30 minutes before checking. Empty your bladder. 2) Sit with your back supported in a flat-backed chair. Rest your arm on something flat (arm of the chair, table, etc). 3) Sit still with your feet flat on the floor, resting, for at least 5 minutes.  4) Check your blood pressure. Take 1-2 readings.  5) Write down these readings and bring with you to any provider appointments.  Bring your home blood pressure machine with you to a provider's office for accuracy comparison at least once a year.   Make sure you take your blood pressure medications before you come to any office visit, even if you were asked to fast for labs.   Cephus Shelling, PharmD Clinical Pharmacist Cell: [phone]

## 2023-08-19 ENCOUNTER — Other Ambulatory Visit: Payer: Self-pay

## 2023-08-19 NOTE — Telephone Encounter (Signed)
 A user error has taken place: encounter opened in error, closed for administrative reasons.

## 2023-08-19 NOTE — Progress Notes (Signed)
   08/19/2023 Name: Judy Graves MRN: [id number] DOB: [birth year]  Chief Complaint  Patient presents with   Diabetes    Judy Graves is a 68 y.o. year old female who presented for a telephone visit.   They were referred to the pharmacist by  Diabetes [employer]  for assistance in managing diabetes.    Subjective:  Care Team: Primary Care Provider: Villa Greaser ; Next Scheduled Visit: 09/16/2023   Medication Access/Adherence  [facility]  [phone] Judy Graves, [place] - [address] [place] [address] Phone: [phone] Fax: [phone]   Patient declines review of diabetes management and adherence. She reports that she is doing fine and taking her medications. She told me that I might could try calling back next month.    Objective:  Lab Results  Component Value Date   HGBA1C 8.2 (A) 05/16/2023    Lab Results  Component Value Date   CREATININE 1.11 (H) 05/16/2023   BUN 14 05/16/2023   NA 140 05/16/2023   K 3.9 05/16/2023   CL 99 05/16/2023   CO2 29 05/16/2023    Lab Results  Component Value Date   CHOL 159 05/16/2023   HDL 44 05/16/2023   LDLCALC 95 05/16/2023   TRIG 109 05/16/2023   CHOLHDL 3.0 06/14/2015    Medications Reviewed Today   Medications were not reviewed in this encounter       Assessment/Plan:   In previous telephone encounter, she was distant and seemed annoyed that I called her and today she was the same. However, patient declined outreached and said to call back next month.  Follow Up Plan: 4-6 weeks  Alexandria Angel, PharmD Clinical Pharmacist Cell: [phone]

## 2023-09-05 DIAGNOSIS — L728 Other follicular cysts of the skin and subcutaneous tissue: Secondary | ICD-10-CM | POA: Diagnosis not present

## 2023-09-16 ENCOUNTER — Ambulatory Visit: Payer: Medicare Other | Admitting: Physician Assistant

## 2023-09-23 ENCOUNTER — Encounter: Payer: Self-pay | Admitting: Physician Assistant

## 2023-09-23 ENCOUNTER — Ambulatory Visit: Admitting: Physician Assistant

## 2023-09-23 VITALS — BP 122/80 | HR 78 | Temp 98.2°F | Resp 16 | Ht 66.0 in | Wt 193.2 lb

## 2023-09-23 DIAGNOSIS — E038 Other specified hypothyroidism: Secondary | ICD-10-CM | POA: Diagnosis not present

## 2023-09-23 DIAGNOSIS — E785 Hyperlipidemia, unspecified: Secondary | ICD-10-CM

## 2023-09-23 DIAGNOSIS — E1165 Type 2 diabetes mellitus with hyperglycemia: Secondary | ICD-10-CM

## 2023-09-23 DIAGNOSIS — R079 Chest pain, unspecified: Secondary | ICD-10-CM

## 2023-09-23 DIAGNOSIS — I10 Essential (primary) hypertension: Secondary | ICD-10-CM

## 2023-09-23 DIAGNOSIS — R0602 Shortness of breath: Secondary | ICD-10-CM

## 2023-09-23 LAB — POCT GLYCOSYLATED HEMOGLOBIN (HGB A1C): Hemoglobin A1C: 6.9 % — AB (ref 4.0–5.6)

## 2023-09-23 MED ORDER — CHLORTHALIDONE 25 MG PO TABS: ORAL_TABLET | ORAL | 3 refills | Status: DC

## 2023-09-23 MED ORDER — ATORVASTATIN CALCIUM 40 MG PO TABS: 40.0000 mg | ORAL_TABLET | Freq: Every day | ORAL | 3 refills | Status: DC

## 2023-09-23 MED ORDER — METFORMIN HCL ER 500 MG PO TB24: 500.0000 mg | ORAL_TABLET | Freq: Every day | ORAL | 3 refills | Status: AC

## 2023-09-23 MED ORDER — LEVOTHYROXINE SODIUM 50 MCG PO TABS: ORAL_TABLET | ORAL | 1 refills | Status: DC

## 2023-09-23 MED ORDER — METOPROLOL SUCCINATE ER 50 MG PO TB24: 50.0000 mg | ORAL_TABLET | Freq: Every day | ORAL | 1 refills | Status: DC

## 2023-09-23 NOTE — Progress Notes (Signed)
 [facility] [address] [place], [place] [address]  Internal MEDICINE  Office Visit Note  Patient Name: Judy Graves  [id number]  [id number]  Date of Service: 09/23/2023  Chief Complaint  Patient presents with   Follow-up   Diabetes   Gastroesophageal Reflux   Hypertension   Medication Refill    Metformin , Levothyroxine , Chlorthalidone , Atorvastatin , Metoprolol     HPI Pt is here for routine follow up -BG in AM 130 when checked, checking a few times, but not daily. Tolerating metformin . Sometimes GI upset but overall does fine with it -needs med refills today -Stopped Vit D since improved on labs, however advised to take OTC. States this can make her constipated, however she has not retried lately. May take every other day and see how this does -never heard from cardiology referral from last August, still will have intermittent CP and SOBOE. She can stop and take deep breaths and usually goes away, but would still like to move forward with cardiology evaluation. Does have a little ankle swelling, improved in evening. Will try compression stockings. Denies CP today. Advised if CP that does not resolve or is worsening to go to ED  Current Medication: Outpatient Encounter Medications as of 09/23/2023  Medication Sig Note   aspirin  (ASPIRIN  LOW DOSE) 81 MG EC tablet Take 1 tablet (81 mg total) by mouth daily. Swallow whole.    EPIPEN  2-PAK 0.3 MG/0.3ML SOAJ injection AS DIRECTED AS DIRECTED INJECTION 2    ergocalciferol  (DRISDOL ) 1.25 MG (50000 UT) capsule Take one cap q week 06/24/2023: HELD per labs   glucose blood (ACCU-CHEK GUIDE) test strip Use as instructed once daily. Dx e11.65    [DISCONTINUED] atorvastatin  (LIPITOR) 40 MG tablet Take 1 tablet (40 mg total) by mouth daily.    [DISCONTINUED] chlorthalidone  (HYGROTON ) 25 MG tablet TAKE ONE TABLET BY MOUTH DAILY *INCREASE POTASSIUM INTAKE WITH DIET*    [DISCONTINUED] levothyroxine  (SYNTHROID ) 50 MCG tablet TAKE 1  TABLET DAILY AND 2 TABLETS ON SUNDAYS    [DISCONTINUED] metFORMIN  (GLUCOPHAGE -XR) 500 MG 24 hr tablet TAKE 1 TABLET BY MOUTH EVERY DAY WITH BREAKFAST    [DISCONTINUED] metoprolol  succinate (TOPROL -XL) 50 MG 24 hr tablet Take 1 tablet (50 mg total) by mouth daily. TAKE WITH OR IMMEDIATELY FOLLOWING A MEAL.    atorvastatin  (LIPITOR) 40 MG tablet Take 1 tablet (40 mg total) by mouth daily.    chlorthalidone  (HYGROTON ) 25 MG tablet TAKE ONE TABLET BY MOUTH DAILY *INCREASE POTASSIUM INTAKE WITH DIET*    levothyroxine  (SYNTHROID ) 50 MCG tablet TAKE 1 TABLET DAILY AND 2 TABLETS ON SUNDAYS    metFORMIN  (GLUCOPHAGE -XR) 500 MG 24 hr tablet Take 1 tablet (500 mg total) by mouth daily with breakfast.    metoprolol  succinate (TOPROL -XL) 50 MG 24 hr tablet Take 1 tablet (50 mg total) by mouth daily. TAKE WITH OR IMMEDIATELY FOLLOWING A MEAL.    No facility-administered encounter medications on file as of 09/23/2023.    Surgical History: Past Surgical History:  Procedure Laterality Date   CARPAL TUNNEL RELEASE Right 2010   Dr. Annabell Key   CATARACT EXTRACTION W/PHACO Left 11/02/2015   Procedure: CATARACT EXTRACTION PHACO AND INTRAOCULAR LENS PLACEMENT (IOC) left eye;  Surgeon: Annell Kidney, MD;  Location: [facility];  Service: Ophthalmology;  Laterality: Left;   CATARACT EXTRACTION W/PHACO Right 02/07/2022   Procedure: CATARACT EXTRACTION PHACO AND INTRAOCULAR LENS PLACEMENT (IOC) RIGHT DIABETIC 6.41 00:43.7;  Surgeon: Annell Kidney, MD;  Location: [facility];  Service: Ophthalmology;  Laterality:  Right;  Diabetic   CHONDROPLASTY Left 11/14/2015   Procedure: CHONDROPLASTY;  Surgeon: Arlyne Lame, MD;  Location: [facility];  Service: Orthopedics;  Laterality: Left;   KNEE ARTHROSCOPY WITH LATERAL MENISECTOMY Left 11/14/2015   Procedure: KNEE ARTHROSCOPY WITH LATERAL MENISECTOMY;  Surgeon: Arlyne Lame, MD;  Location: [facility];  Service: Orthopedics;  Laterality: Left;   KNEE  ARTHROSCOPY WITH MEDIAL MENISECTOMY Left 11/14/2015   Procedure: KNEE ARTHROSCOPY WITH MEDIAL MENISECTOMY;  Surgeon: Arlyne Lame, MD;  Location: [facility];  Service: Orthopedics;  Laterality: Left;   TONSILLECTOMY     TUBAL LIGATION      Medical History: Past Medical History:  Diagnosis Date   Allergy    Arthritis    in knee   Chronic constipation    Chronic insomnia    Dental bridge present    Permanent - top - front   Diabetes mellitus without complication (HCC)    GERD (gastroesophageal reflux disease)    Hemorrhoids    Hypertension    Hypothyroidism, adult    Impingement syndrome of right shoulder     Ortho subacromial bursitis and tendinitis right shoulder   Lumbar spinal stenosis    Dr. Leeland Pugh   Lump or mass in breast    Dr. Lorel Roes evaluated, likely lipoma   Menopause    Metabolic syndrome    Mild carpal tunnel syndrome of right wrist    Obesity (BMI 35.0-39.9 without comorbidity)    Proteinuria     Family History: Family History  Problem Relation Age of Onset   Cancer Mother        Breast   Diabetes Mother    Breast cancer Mother    Hypertension Father    CVA Father        68's   Hypertension Sister    Hypertension Brother    Breast cancer Paternal Aunt     Social History   Socioeconomic History   Marital status: Married    Spouse name: Not on file   Number of children: Not on file   Years of education: Not on file   Highest education level: Not on file  Occupational History   Not on file  Tobacco Use   Smoking status: Never   Smokeless tobacco: Never  Vaping Use   Vaping status: Never Used  Substance and Sexual Activity   Alcohol use: No    Alcohol/week: 0.0 standard drinks of alcohol   Drug use: No   Sexual activity: Yes    Partners: Male  Other Topics Concern   Not on file  Social History Narrative   Not on file   Social Drivers of Health   Financial Resource Strain: Not on file  Food Insecurity: Not on file   Transportation Needs: Not on file  Physical Activity: Not on file  Stress: Not on file  Social Connections: Not on file  Intimate Partner Violence: Not on file      Review of Systems  Constitutional:  Negative for chills, diaphoresis and fatigue.  HENT:  Negative for ear pain, postnasal drip and sinus pressure.   Eyes:  Negative for photophobia, discharge, redness, itching and visual disturbance.  Respiratory:  Negative for cough and wheezing.   Cardiovascular:  Positive for leg swelling. Negative for chest pain.  Gastrointestinal:  Negative for abdominal pain, constipation, diarrhea, nausea and vomiting.  Genitourinary:  Negative for dysuria and flank pain.  Musculoskeletal:  Negative for back pain, gait problem and neck pain.  Skin:  Negative for color change.  Allergic/Immunologic: Negative for environmental allergies and food allergies.  Neurological:  Negative for dizziness and headaches.  Hematological:  Does not bruise/bleed easily.  Psychiatric/Behavioral:  Negative for agitation, behavioral problems (depression) and hallucinations.     Vital Signs: BP 122/80   Pulse 78   Temp 98.2 F (36.8 C)   Resp 16   Ht 5\' 6"  (1.676 m)   Wt 193 lb 3.2 oz (87.6 kg)   SpO2 96%   BMI 31.18 kg/m    Physical Exam Vitals and nursing note reviewed.  Constitutional:      Appearance: Normal appearance.  HENT:     Head: Normocephalic and atraumatic.  Eyes:     Pupils: Pupils are equal, round, and reactive to light.  Cardiovascular:     Rate and Rhythm: Normal rate and regular rhythm.  Pulmonary:     Effort: Pulmonary effort is normal.     Breath sounds: Normal breath sounds.  Skin:    General: Skin is warm and dry.  Neurological:     General: No focal deficit present.     Mental Status: She is alert.  Psychiatric:        Mood and Affect: Mood normal.        Behavior: Behavior normal.        Assessment/Plan: 1. Type 2 diabetes mellitus with hyperglycemia, without  long-term current use of insulin (HCC) (Primary) - POCT HgB A1C is 6.9 which is improved from 8.2 last visit. Will continue metformin  and monitoring - metFORMIN  (GLUCOPHAGE -XR) 500 MG 24 hr tablet; Take 1 tablet (500 mg total) by mouth daily with breakfast.  Dispense: 90 tablet; Refill: 3  2. Benign hypertension Stable, continue current medications - chlorthalidone  (HYGROTON ) 25 MG tablet; TAKE ONE TABLET BY MOUTH DAILY *INCREASE POTASSIUM INTAKE WITH DIET*  Dispense: 90 tablet; Refill: 3 - metoprolol  succinate (TOPROL -XL) 50 MG 24 hr tablet; Take 1 tablet (50 mg total) by mouth daily. TAKE WITH OR IMMEDIATELY FOLLOWING A MEAL.  Dispense: 90 tablet; Refill: 1  3. Other specified hypothyroidism - levothyroxine  (SYNTHROID ) 50 MCG tablet; TAKE 1 TABLET DAILY AND 2 TABLETS ON SUNDAYS  Dispense: 102 tablet; Refill: 1  4. Dyslipidemia Continue lipitor as before - atorvastatin  (LIPITOR) 40 MG tablet; Take 1 tablet (40 mg total) by mouth daily.  Dispense: 90 tablet; Refill: 3  5. Intermittent chest pain Will send new referral to cardiology. Advised to go to ED if any new or worsening symptoms arise - Ambulatory referral to Cardiology  6. SOBOE (shortness of breath on exertion) - Ambulatory referral to Cardiology   General Counseling: aurilla coulibaly understanding of the findings of todays visit and agrees with plan of treatment. I have discussed any further diagnostic evaluation that may be needed or ordered today. We also reviewed her medications today. she has been encouraged to call the office with any questions or concerns that should arise related to todays visit.    Orders Placed This Encounter  Procedures   Ambulatory referral to Cardiology   POCT HgB A1C    Meds ordered this encounter  Medications   metFORMIN  (GLUCOPHAGE -XR) 500 MG 24 hr tablet    Sig: Take 1 tablet (500 mg total) by mouth daily with breakfast.    Dispense:  90 tablet    Refill:  3   levothyroxine   (SYNTHROID ) 50 MCG tablet    Sig: TAKE 1 TABLET DAILY AND 2 TABLETS ON SUNDAYS    Dispense:  102 tablet  Refill:  1   chlorthalidone  (HYGROTON ) 25 MG tablet    Sig: TAKE ONE TABLET BY MOUTH DAILY *INCREASE POTASSIUM INTAKE WITH DIET*    Dispense:  90 tablet    Refill:  3   atorvastatin  (LIPITOR) 40 MG tablet    Sig: Take 1 tablet (40 mg total) by mouth daily.    Dispense:  90 tablet    Refill:  3   metoprolol  succinate (TOPROL -XL) 50 MG 24 hr tablet    Sig: Take 1 tablet (50 mg total) by mouth daily. TAKE WITH OR IMMEDIATELY FOLLOWING A MEAL.    Dispense:  90 tablet    Refill:  1    This patient was seen by Taylor Favia, PA-C in collaboration with Dr. Verneta Gone as a part of collaborative care agreement.   Total time spent:30 Minutes Time spent includes review of chart, medications, test results, and follow up plan with the patient.      Dr Fozia M Khan Internal medicine

## 2023-09-26 ENCOUNTER — Encounter: Payer: Self-pay | Admitting: Cardiovascular Disease

## 2023-09-26 ENCOUNTER — Ambulatory Visit: Admitting: Cardiovascular Disease

## 2023-09-26 VITALS — BP 112/66 | HR 87 | Ht 66.0 in | Wt 192.0 lb

## 2023-09-26 DIAGNOSIS — I10 Essential (primary) hypertension: Secondary | ICD-10-CM

## 2023-09-26 DIAGNOSIS — R0789 Other chest pain: Secondary | ICD-10-CM | POA: Diagnosis not present

## 2023-09-26 DIAGNOSIS — E782 Mixed hyperlipidemia: Secondary | ICD-10-CM

## 2023-09-26 DIAGNOSIS — K219 Gastro-esophageal reflux disease without esophagitis: Secondary | ICD-10-CM | POA: Diagnosis not present

## 2023-09-26 DIAGNOSIS — R9431 Abnormal electrocardiogram [ECG] [EKG]: Secondary | ICD-10-CM

## 2023-09-26 DIAGNOSIS — R0602 Shortness of breath: Secondary | ICD-10-CM | POA: Diagnosis not present

## 2023-09-26 DIAGNOSIS — E119 Type 2 diabetes mellitus without complications: Secondary | ICD-10-CM | POA: Diagnosis not present

## 2023-09-26 NOTE — Progress Notes (Signed)
 Cardiology Office Note   Date:  09/26/2023   ID:  Cree, [place]oli [birth year], MRN [id number]  PCP:  Jacques Mattock, PA-C  Cardiologist:  Debborah Fairly, MD      History of Present Illness: Judy Graves is a 68 y.o. female who presents for  Chief Complaint  Patient presents with   Consult    Cardiac Consults    68YOF presents with chest pain and gets better by sitting down.,  Chest Pain  This is a new problem. The current episode started 1 to 4 weeks ago. The onset quality is sudden. The problem occurs 2 to 4 times per day. The problem has been waxing and waning. The pain is at a severity of 6/10. The quality of the pain is described as heavy and sharp. Associated symptoms include diaphoresis, headaches, palpitations and shortness of breath. Pertinent negatives include no near-syncope, numbness, vomiting or weakness. The treatment provided moderate relief.      Past Medical History:  Diagnosis Date   Allergy    Arthritis    in knee   Chronic constipation    Chronic insomnia    Dental bridge present    Permanent - top - front   Diabetes mellitus without complication (HCC)    GERD (gastroesophageal reflux disease)    Hemorrhoids    Hypertension    Hypothyroidism, adult    Impingement syndrome of right shoulder    [facility] subacromial bursitis and tendinitis right shoulder   Lumbar spinal stenosis    Dr. Leeland Pugh   Lump or mass in breast    Dr. Lorel Roes evaluated, likely lipoma   Menopause    Metabolic syndrome    Mild carpal tunnel syndrome of right wrist    Obesity (BMI 35.0-39.9 without comorbidity)    Proteinuria      Past Surgical History:  Procedure Laterality Date   CARPAL TUNNEL RELEASE Right 2010   Dr. Annabell Key   CATARACT EXTRACTION W/PHACO Left 11/02/2015   Procedure: CATARACT EXTRACTION PHACO AND INTRAOCULAR LENS PLACEMENT (IOC) left eye;  Surgeon: Annell Kidney, MD;  Location: [facility];  Service: Ophthalmology;   Laterality: Left;   CATARACT EXTRACTION W/PHACO Right 02/07/2022   Procedure: CATARACT EXTRACTION PHACO AND INTRAOCULAR LENS PLACEMENT (IOC) RIGHT DIABETIC 6.41 00:43.7;  Surgeon: Annell Kidney, MD;  Location: [facility];  Service: Ophthalmology;  Laterality: Right;  Diabetic   CHONDROPLASTY Left 11/14/2015   Procedure: CHONDROPLASTY;  Surgeon: Arlyne Lame, MD;  Location: [facility];  Service: Orthopedics;  Laterality: Left;   KNEE ARTHROSCOPY WITH LATERAL MENISECTOMY Left 11/14/2015   Procedure: KNEE ARTHROSCOPY WITH LATERAL MENISECTOMY;  Surgeon: Arlyne Lame, MD;  Location: [facility];  Service: Orthopedics;  Laterality: Left;   KNEE ARTHROSCOPY WITH MEDIAL MENISECTOMY Left 11/14/2015   Procedure: KNEE ARTHROSCOPY WITH MEDIAL MENISECTOMY;  Surgeon: Arlyne Lame, MD;  Location: [facility];  Service: Orthopedics;  Laterality: Left;   TONSILLECTOMY     TUBAL LIGATION       Current Outpatient Medications  Medication Sig Dispense Refill   aspirin  (ASPIRIN  LOW DOSE) 81 MG EC tablet Take 1 tablet (81 mg total) by mouth daily. Swallow whole. 90 tablet 3   atorvastatin  (LIPITOR) 40 MG tablet Take 1 tablet (40 mg total) by mouth daily. 90 tablet 3   chlorthalidone  (HYGROTON ) 25 MG tablet TAKE ONE TABLET BY MOUTH DAILY *INCREASE POTASSIUM INTAKE WITH DIET* 90 tablet 3   EPIPEN  2-PAK 0.3 MG/0.3ML SOAJ injection AS DIRECTED  AS DIRECTED INJECTION 2  1   ergocalciferol  (DRISDOL ) 1.25 MG (50000 UT) capsule Take one cap q week 12 capsule 3   glucose blood (ACCU-CHEK GUIDE) test strip Use as instructed once daily. Dx e11.65 100 each 3   levothyroxine  (SYNTHROID ) 50 MCG tablet TAKE 1 TABLET DAILY AND 2 TABLETS ON SUNDAYS 102 tablet 1   metFORMIN  (GLUCOPHAGE -XR) 500 MG 24 hr tablet Take 1 tablet (500 mg total) by mouth daily with breakfast. 90 tablet 3   metoprolol  succinate (TOPROL -XL) 50 MG 24 hr tablet Take 1 tablet (50 mg total) by mouth daily. TAKE WITH OR IMMEDIATELY FOLLOWING A MEAL. 90  tablet 1   No current facility-administered medications for this visit.    Allergies:   Ace inhibitors, Amlodipine , and Shrimp (diagnostic)    Social History:   reports that she has never smoked. She has never used smokeless tobacco. She reports that she does not drink alcohol and does not use drugs.   Family History:  family history includes Breast cancer in her mother and paternal aunt; CVA in her father; Cancer in her mother; Diabetes in her mother; Hypertension in her brother, father, and sister.    ROS:     Review of Systems  Constitutional:  Positive for diaphoresis.  HENT: Negative.    Eyes: Negative.   Respiratory:  Positive for shortness of breath.   Cardiovascular:  Positive for chest pain and palpitations. Negative for near-syncope.  Gastrointestinal: Negative.  Negative for vomiting.  Genitourinary: Negative.   Musculoskeletal: Negative.   Skin: Negative.   Neurological:  Positive for headaches. Negative for weakness and numbness.  Endo/Heme/Allergies: Negative.   Psychiatric/Behavioral: Negative.    All other systems reviewed and are negative.     All other systems are reviewed and negative.    PHYSICAL EXAM: VS:  BP 112/66   Pulse 87   Ht 5\' 6"  (1.676 m)   Wt 192 lb (87.1 kg)   SpO2 97%   BMI 30.99 kg/m  , BMI Body mass index is 30.99 kg/m. Last weight:  Wt Readings from Last 3 Encounters:  09/26/23 192 lb (87.1 kg)  09/23/23 193 lb 3.2 oz (87.6 kg)  05/16/23 196 lb (88.9 kg)     Physical Exam Constitutional:      Appearance: Normal appearance.  Cardiovascular:     Rate and Rhythm: Normal rate and regular rhythm.     Heart sounds: Normal heart sounds.  Pulmonary:     Effort: Pulmonary effort is normal.     Breath sounds: Normal breath sounds.  Musculoskeletal:     Right lower leg: No edema.     Left lower leg: No edema.  Neurological:     Mental Status: She is alert.       EKG: nsr 84/min non specific st changes  Recent  Labs: 05/16/2023: ALT 28; BUN 14; Creatinine, Ser 1.11; Hemoglobin 14.1; Platelets 213; Potassium 3.9; Sodium 140; TSH 2.560    Lipid Panel    Component Value Date/Time   CHOL 159 05/16/2023 1038   TRIG 109 05/16/2023 1038   HDL 44 05/16/2023 1038   CHOLHDL 3.0 06/14/2015 1058   LDLCALC 95 05/16/2023 1038      Other studies Reviewed: Additional studies/ records that were reviewed today include:  Review of the above records demonstrates:       No data to display            ASSESSMENT AND PLAN:    ICD-10-CM   1. Benign  hypertension  I10 PCV ECHOCARDIOGRAM COMPLETE    MYOCARDIAL PERFUSION IMAGING    2. Diabetes mellitus without complication (HCC)  E11.9 PCV ECHOCARDIOGRAM COMPLETE    MYOCARDIAL PERFUSION IMAGING    3. Gastroesophageal reflux disease without esophagitis  K21.9 PCV ECHOCARDIOGRAM COMPLETE    MYOCARDIAL PERFUSION IMAGING    4. SOB (shortness of breath)  R06.02 PCV ECHOCARDIOGRAM COMPLETE    MYOCARDIAL PERFUSION IMAGING    5. Other chest pain  R07.89 PCV ECHOCARDIOGRAM COMPLETE    MYOCARDIAL PERFUSION IMAGING    6. Mixed hyperlipidemia  E78.2 PCV ECHOCARDIOGRAM COMPLETE    MYOCARDIAL PERFUSION IMAGING    7. Abnormal EKG  R94.31 PCV ECHOCARDIOGRAM COMPLETE    MYOCARDIAL PERFUSION IMAGING       Problem List Items Addressed This Visit       Cardiovascular and Mediastinum   Benign hypertension - Primary   Relevant Orders   PCV ECHOCARDIOGRAM COMPLETE   MYOCARDIAL PERFUSION IMAGING     Digestive   Gastroesophageal reflux disease without esophagitis   Relevant Orders   PCV ECHOCARDIOGRAM COMPLETE   MYOCARDIAL PERFUSION IMAGING     Endocrine   Diabetes mellitus without complication (HCC)   Relevant Orders   PCV ECHOCARDIOGRAM COMPLETE   MYOCARDIAL PERFUSION IMAGING   Other Visit Diagnoses       SOB (shortness of breath)       Relevant Orders   PCV ECHOCARDIOGRAM COMPLETE   MYOCARDIAL PERFUSION IMAGING     Other chest pain        Relevant Orders   PCV ECHOCARDIOGRAM COMPLETE   MYOCARDIAL PERFUSION IMAGING     Mixed hyperlipidemia       Relevant Orders   PCV ECHOCARDIOGRAM COMPLETE   MYOCARDIAL PERFUSION IMAGING     Abnormal EKG       Relevant Orders   PCV ECHOCARDIOGRAM COMPLETE   MYOCARDIAL PERFUSION IMAGING          Disposition:   Return in about 3 weeks (around 10/17/2023) for echo, stress test and f/u.    Total time spent: 50 minutes  Signed,  Debborah Fairly, MD  09/26/2023 11:22 AM    [facility]

## 2023-09-27 ENCOUNTER — Encounter: Payer: Self-pay | Admitting: Cardiovascular Disease

## 2023-10-02 NOTE — Progress Notes (Signed)
   10/02/2023  Patient ID: Judy Graves, female   DOB: Oct 01, 1955, 68 y.o.   MRN: [id number]  The patient was identified on the Diabetes True [place] Metric Report for uncontrolled diabetes. The most recent A1c was 6.9%, and the patient was seen by her PCP on 09/23/2023. She has a follow-up appointment scheduled with her PCP on 01/27/2024. Given the improvement in her A1c, no follow-up is planned at this time with pharmacist. However, a follow-up will be conducted in 6-8 weeks, with pharmacist, following her most recent PCP visit.  Alexandria Angel, PharmD Clinical Pharmacist Cell: [phone]

## 2023-10-10 DIAGNOSIS — L728 Other follicular cysts of the skin and subcutaneous tissue: Secondary | ICD-10-CM | POA: Diagnosis not present

## 2023-10-10 DIAGNOSIS — L905 Scar conditions and fibrosis of skin: Secondary | ICD-10-CM | POA: Diagnosis not present

## 2023-10-10 DIAGNOSIS — L538 Other specified erythematous conditions: Secondary | ICD-10-CM | POA: Diagnosis not present

## 2023-10-10 DIAGNOSIS — L720 Epidermal cyst: Secondary | ICD-10-CM | POA: Diagnosis not present

## 2023-10-10 DIAGNOSIS — R208 Other disturbances of skin sensation: Secondary | ICD-10-CM | POA: Diagnosis not present

## 2023-10-18 ENCOUNTER — Ambulatory Visit (INDEPENDENT_AMBULATORY_CARE_PROVIDER_SITE_OTHER)

## 2023-10-18 DIAGNOSIS — K219 Gastro-esophageal reflux disease without esophagitis: Secondary | ICD-10-CM

## 2023-10-18 DIAGNOSIS — I340 Nonrheumatic mitral (valve) insufficiency: Secondary | ICD-10-CM

## 2023-10-18 DIAGNOSIS — I371 Nonrheumatic pulmonary valve insufficiency: Secondary | ICD-10-CM | POA: Diagnosis not present

## 2023-10-18 DIAGNOSIS — R0789 Other chest pain: Secondary | ICD-10-CM

## 2023-10-18 DIAGNOSIS — E119 Type 2 diabetes mellitus without complications: Secondary | ICD-10-CM

## 2023-10-18 DIAGNOSIS — I361 Nonrheumatic tricuspid (valve) insufficiency: Secondary | ICD-10-CM

## 2023-10-18 DIAGNOSIS — E782 Mixed hyperlipidemia: Secondary | ICD-10-CM

## 2023-10-18 DIAGNOSIS — R0602 Shortness of breath: Secondary | ICD-10-CM

## 2023-10-18 DIAGNOSIS — R9431 Abnormal electrocardiogram [ECG] [EKG]: Secondary | ICD-10-CM

## 2023-10-18 DIAGNOSIS — I10 Essential (primary) hypertension: Secondary | ICD-10-CM

## 2023-10-24 ENCOUNTER — Encounter

## 2023-10-31 ENCOUNTER — Ambulatory Visit: Admitting: Cardiovascular Disease

## 2023-11-11 ENCOUNTER — Ambulatory Visit (INDEPENDENT_AMBULATORY_CARE_PROVIDER_SITE_OTHER)

## 2023-11-11 DIAGNOSIS — R0789 Other chest pain: Secondary | ICD-10-CM | POA: Diagnosis not present

## 2023-11-11 DIAGNOSIS — R0602 Shortness of breath: Secondary | ICD-10-CM

## 2023-11-11 DIAGNOSIS — R9431 Abnormal electrocardiogram [ECG] [EKG]: Secondary | ICD-10-CM

## 2023-11-11 DIAGNOSIS — E782 Mixed hyperlipidemia: Secondary | ICD-10-CM

## 2023-11-11 DIAGNOSIS — E119 Type 2 diabetes mellitus without complications: Secondary | ICD-10-CM

## 2023-11-11 DIAGNOSIS — I10 Essential (primary) hypertension: Secondary | ICD-10-CM

## 2023-11-11 DIAGNOSIS — K219 Gastro-esophageal reflux disease without esophagitis: Secondary | ICD-10-CM

## 2023-11-18 ENCOUNTER — Ambulatory Visit (INDEPENDENT_AMBULATORY_CARE_PROVIDER_SITE_OTHER): Admitting: Cardiovascular Disease

## 2023-11-18 ENCOUNTER — Encounter: Payer: Self-pay | Admitting: Cardiovascular Disease

## 2023-11-18 VITALS — BP 118/62 | HR 101 | Ht 66.0 in | Wt 194.0 lb

## 2023-11-18 DIAGNOSIS — R0602 Shortness of breath: Secondary | ICD-10-CM | POA: Diagnosis not present

## 2023-11-18 DIAGNOSIS — R0789 Other chest pain: Secondary | ICD-10-CM

## 2023-11-18 DIAGNOSIS — I5033 Acute on chronic diastolic (congestive) heart failure: Secondary | ICD-10-CM

## 2023-11-18 DIAGNOSIS — I10 Essential (primary) hypertension: Secondary | ICD-10-CM

## 2023-11-18 DIAGNOSIS — I4711 Inappropriate sinus tachycardia, so stated: Secondary | ICD-10-CM

## 2023-11-18 DIAGNOSIS — E782 Mixed hyperlipidemia: Secondary | ICD-10-CM

## 2023-11-18 DIAGNOSIS — R9431 Abnormal electrocardiogram [ECG] [EKG]: Secondary | ICD-10-CM

## 2023-11-18 MED ORDER — PANTOPRAZOLE SODIUM 40 MG PO TBEC: 40.0000 mg | DELAYED_RELEASE_TABLET | Freq: Every day | ORAL | 11 refills | Status: AC

## 2023-11-18 MED ORDER — METOPROLOL SUCCINATE ER 100 MG PO TB24: 100.0000 mg | ORAL_TABLET | Freq: Every day | ORAL | 11 refills | Status: AC

## 2023-11-18 MED ORDER — SPIRONOLACTONE 25 MG PO TABS: 12.5000 mg | ORAL_TABLET | Freq: Every day | ORAL | 11 refills | Status: AC

## 2023-11-18 NOTE — Progress Notes (Signed)
 Cardiology Office Note   Date:  11/18/2023   ID:  Judy Graves, DOB [birth year], MRN [id number]  PCP:  Kristina Tinnie POUR, PA-C  Cardiologist:  Denyse Bathe, MD      History of Present Illness: Judy Graves is a 68 y.o. female who presents for  Chief Complaint  Patient presents with   Follow-up    74mo NST/echo results    Had chest pain last night for few minutes.      Past Medical History:  Diagnosis Date   Allergy    Arthritis    in knee   Chronic constipation    Chronic insomnia    Dental bridge present    Permanent - top - front   Diabetes mellitus without complication (HCC)    GERD (gastroesophageal reflux disease)    Hemorrhoids    Hypertension    Hypothyroidism, adult    Impingement syndrome of right shoulder    [facility] subacromial bursitis and tendinitis right shoulder   Lumbar spinal stenosis    Dr. Claudie   Lump or mass in breast    Dr. Dellie evaluated, likely lipoma   Menopause    Metabolic syndrome    Mild carpal tunnel syndrome of right wrist    Obesity (BMI 35.0-39.9 without comorbidity)    Proteinuria      Past Surgical History:  Procedure Laterality Date   CARPAL TUNNEL RELEASE Right 2010   Dr. Cleotilde   CATARACT EXTRACTION W/PHACO Left 11/02/2015   Procedure: CATARACT EXTRACTION PHACO AND INTRAOCULAR LENS PLACEMENT (IOC) left eye;  Surgeon: Dene Etienne, MD;  Location: [facility];  Service: Ophthalmology;  Laterality: Left;   CATARACT EXTRACTION W/PHACO Right 02/07/2022   Procedure: CATARACT EXTRACTION PHACO AND INTRAOCULAR LENS PLACEMENT (IOC) RIGHT DIABETIC 6.41 00:43.7;  Surgeon: Etienne Dene, MD;  Location: [facility];  Service: Ophthalmology;  Laterality: Right;  Diabetic   CHONDROPLASTY Left 11/14/2015   Procedure: CHONDROPLASTY;  Surgeon: Lynwood SHAUNNA Hue, MD;  Location: [facility];  Service: Orthopedics;  Laterality: Left;   KNEE ARTHROSCOPY WITH LATERAL MENISECTOMY Left 11/14/2015    Procedure: KNEE ARTHROSCOPY WITH LATERAL MENISECTOMY;  Surgeon: Lynwood SHAUNNA Hue, MD;  Location: [facility];  Service: Orthopedics;  Laterality: Left;   KNEE ARTHROSCOPY WITH MEDIAL MENISECTOMY Left 11/14/2015   Procedure: KNEE ARTHROSCOPY WITH MEDIAL MENISECTOMY;  Surgeon: Lynwood SHAUNNA Hue, MD;  Location: [facility];  Service: Orthopedics;  Laterality: Left;   TONSILLECTOMY     TUBAL LIGATION       Current Outpatient Medications  Medication Sig Dispense Refill   metoprolol  succinate (TOPROL  XL) 100 MG 24 hr tablet Take 1 tablet (100 mg total) by mouth daily. Take with or immediately following a meal. 30 tablet 11   pantoprazole  (PROTONIX ) 40 MG tablet Take 1 tablet (40 mg total) by mouth daily. 30 tablet 11   spironolactone  (ALDACTONE ) 25 MG tablet Take 0.5 tablets (12.5 mg total) by mouth daily. 30 tablet 11   aspirin  (ASPIRIN  LOW DOSE) 81 MG EC tablet Take 1 tablet (81 mg total) by mouth daily. Swallow whole. 90 tablet 3   atorvastatin  (LIPITOR) 40 MG tablet Take 1 tablet (40 mg total) by mouth daily. 90 tablet 3   EPIPEN  2-PAK 0.3 MG/0.3ML SOAJ injection AS DIRECTED AS DIRECTED INJECTION 2  1   ergocalciferol  (DRISDOL ) 1.25 MG (50000 UT) capsule Take one cap q week 12 capsule 3   glucose blood (ACCU-CHEK GUIDE) test strip Use as instructed once daily. Dx  e11.65 100 each 3   levothyroxine  (SYNTHROID ) 50 MCG tablet TAKE 1 TABLET DAILY AND 2 TABLETS ON SUNDAYS 102 tablet 1   metFORMIN  (GLUCOPHAGE -XR) 500 MG 24 hr tablet Take 1 tablet (500 mg total) by mouth daily with breakfast. 90 tablet 3   No current facility-administered medications for this visit.    Allergies:   Ace inhibitors, Amlodipine , and Shrimp (diagnostic)    Social History:   reports that she has never smoked. She has never used smokeless tobacco. She reports that she does not drink alcohol and does not use drugs.   Family History:  family history includes Breast cancer in her mother and paternal aunt; CVA in her father; Cancer in  her mother; Diabetes in her mother; Hypertension in her brother, father, and sister.    ROS:     Review of Systems  Constitutional: Negative.   HENT: Negative.    Eyes: Negative.   Respiratory: Negative.    Gastrointestinal: Negative.   Genitourinary: Negative.   Musculoskeletal: Negative.   Skin: Negative.   Neurological: Negative.   Endo/Heme/Allergies: Negative.   Psychiatric/Behavioral: Negative.    All other systems reviewed and are negative.     All other systems are reviewed and negative.    PHYSICAL EXAM: VS:  BP 118/62   Pulse (!) 101   Ht 5' 6 (1.676 m)   Wt 194 lb (88 kg)   SpO2 94%   BMI 31.31 kg/m  , BMI Body mass index is 31.31 kg/m. Last weight:  Wt Readings from Last 3 Encounters:  11/18/23 194 lb (88 kg)  09/26/23 192 lb (87.1 kg)  09/23/23 193 lb 3.2 oz (87.6 kg)     Physical Exam Constitutional:      Appearance: Normal appearance.  Cardiovascular:     Rate and Rhythm: Normal rate and regular rhythm.     Heart sounds: Normal heart sounds.  Pulmonary:     Effort: Pulmonary effort is normal.     Breath sounds: Normal breath sounds.  Musculoskeletal:     Right lower leg: No edema.     Left lower leg: No edema.  Neurological:     Mental Status: She is alert.       EKG:   Recent Labs: 05/16/2023: ALT 28; BUN 14; Creatinine, Ser 1.11; Hemoglobin 14.1; Platelets 213; Potassium 3.9; Sodium 140; TSH 2.560    Lipid Panel    Component Value Date/Time   CHOL 159 05/16/2023 1038   TRIG 109 05/16/2023 1038   HDL 44 05/16/2023 1038   CHOLHDL 3.0 06/14/2015 1058   LDLCALC 95 05/16/2023 1038      Other studies Reviewed: Additional studies/ records that were reviewed today include:  Review of the above records demonstrates:       No data to display            ASSESSMENT AND PLAN:    ICD-10-CM   1. Benign hypertension  I10 pantoprazole  (PROTONIX ) 40 MG tablet    spironolactone  (ALDACTONE ) 25 MG tablet    metoprolol  succinate  (TOPROL  XL) 100 MG 24 hr tablet    2. SOB (shortness of breath)  R06.02 pantoprazole  (PROTONIX ) 40 MG tablet    spironolactone  (ALDACTONE ) 25 MG tablet    metoprolol  succinate (TOPROL  XL) 100 MG 24 hr tablet   Grade 1 diastolic dysfunction, farxiga  not approved, so add aldactone . since BP normal, by adding aldactone  it will drop further thus stop chlothalidone.    3. Other chest pain  R07.89 pantoprazole  (  PROTONIX ) 40 MG tablet    spironolactone  (ALDACTONE ) 25 MG tablet    metoprolol  succinate (TOPROL  XL) 100 MG 24 hr tablet   Stress test normal, echo had grade 1  diastolic dysfunction, trace MR. May have GERD as cause of chest pain, add protonix  40 daily.    4. Mixed hyperlipidemia  E78.2 pantoprazole  (PROTONIX ) 40 MG tablet    spironolactone  (ALDACTONE ) 25 MG tablet    metoprolol  succinate (TOPROL  XL) 100 MG 24 hr tablet    5. Abnormal EKG  R94.31 pantoprazole  (PROTONIX ) 40 MG tablet    spironolactone  (ALDACTONE ) 25 MG tablet    metoprolol  succinate (TOPROL  XL) 100 MG 24 hr tablet    6. CHF (congestive heart failure), NYHA class III, acute on chronic, diastolic (HCC)  I50.33 spironolactone  (ALDACTONE ) 25 MG tablet    metoprolol  succinate (TOPROL  XL) 100 MG 24 hr tablet   ECHO had normal LVEF, but has grade 1 diastolic dysfunction,advise aldactone , stop chlorthalidone . Ideally be on farxiga , but insurance would not pay fo it .    7. Inappropriate sinus tachycardia (HCC)  I47.11    increase metoprolol  from 50 to 100 mg daily.       Problem List Items Addressed This Visit       Cardiovascular and Mediastinum   Benign hypertension - Primary   Relevant Medications   pantoprazole  (PROTONIX ) 40 MG tablet   spironolactone  (ALDACTONE ) 25 MG tablet   metoprolol  succinate (TOPROL  XL) 100 MG 24 hr tablet   Other Visit Diagnoses       SOB (shortness of breath)       Grade 1 diastolic dysfunction, farxiga  not approved, so add aldactone . since BP normal, by adding aldactone  it will drop  further thus stop chlothalidone.   Relevant Medications   pantoprazole  (PROTONIX ) 40 MG tablet   spironolactone  (ALDACTONE ) 25 MG tablet   metoprolol  succinate (TOPROL  XL) 100 MG 24 hr tablet     Other chest pain       Stress test normal, echo had grade 1  diastolic dysfunction, trace MR. May have GERD as cause of chest pain, add protonix  40 daily.   Relevant Medications   pantoprazole  (PROTONIX ) 40 MG tablet   spironolactone  (ALDACTONE ) 25 MG tablet   metoprolol  succinate (TOPROL  XL) 100 MG 24 hr tablet     Mixed hyperlipidemia       Relevant Medications   pantoprazole  (PROTONIX ) 40 MG tablet   spironolactone  (ALDACTONE ) 25 MG tablet   metoprolol  succinate (TOPROL  XL) 100 MG 24 hr tablet     Abnormal EKG       Relevant Medications   pantoprazole  (PROTONIX ) 40 MG tablet   spironolactone  (ALDACTONE ) 25 MG tablet   metoprolol  succinate (TOPROL  XL) 100 MG 24 hr tablet     CHF (congestive heart failure), NYHA class III, acute on chronic, diastolic (HCC)       ECHO had normal LVEF, but has grade 1 diastolic dysfunction,advise aldactone , stop chlorthalidone . Ideally be on farxiga , but insurance would not pay fo it .   Relevant Medications   spironolactone  (ALDACTONE ) 25 MG tablet   metoprolol  succinate (TOPROL  XL) 100 MG 24 hr tablet     Inappropriate sinus tachycardia (HCC)       increase metoprolol  from 50 to 100 mg daily.   Relevant Medications   spironolactone  (ALDACTONE ) 25 MG tablet   metoprolol  succinate (TOPROL  XL) 100 MG 24 hr tablet          Disposition:   Return in  about 5 weeks (around 12/23/2023).    Total time spent: 35 minutes  Signed,  Denyse Bathe, MD  11/18/2023 12:13 PM    [facility]

## 2023-11-20 ENCOUNTER — Other Ambulatory Visit: Payer: Self-pay | Admitting: Physician Assistant

## 2023-11-20 DIAGNOSIS — E038 Other specified hypothyroidism: Secondary | ICD-10-CM

## 2023-12-23 ENCOUNTER — Ambulatory Visit: Admitting: Cardiovascular Disease

## 2024-01-27 ENCOUNTER — Ambulatory Visit: Admitting: Physician Assistant

## 2024-02-28 ENCOUNTER — Encounter: Payer: Self-pay | Admitting: Physician Assistant

## 2024-02-28 ENCOUNTER — Ambulatory Visit (INDEPENDENT_AMBULATORY_CARE_PROVIDER_SITE_OTHER): Admitting: Physician Assistant

## 2024-02-28 VITALS — BP 132/85 | HR 90 | Temp 98.4°F | Resp 16 | Ht 66.0 in | Wt 191.0 lb

## 2024-02-28 DIAGNOSIS — J0100 Acute maxillary sinusitis, unspecified: Secondary | ICD-10-CM

## 2024-02-28 DIAGNOSIS — R5383 Other fatigue: Secondary | ICD-10-CM

## 2024-02-28 DIAGNOSIS — I10 Essential (primary) hypertension: Secondary | ICD-10-CM | POA: Diagnosis not present

## 2024-02-28 DIAGNOSIS — E538 Deficiency of other specified B group vitamins: Secondary | ICD-10-CM

## 2024-02-28 DIAGNOSIS — E039 Hypothyroidism, unspecified: Secondary | ICD-10-CM

## 2024-02-28 DIAGNOSIS — E1165 Type 2 diabetes mellitus with hyperglycemia: Secondary | ICD-10-CM

## 2024-02-28 DIAGNOSIS — I4711 Inappropriate sinus tachycardia, so stated: Secondary | ICD-10-CM | POA: Diagnosis not present

## 2024-02-28 DIAGNOSIS — E559 Vitamin D deficiency, unspecified: Secondary | ICD-10-CM

## 2024-02-28 DIAGNOSIS — I5032 Chronic diastolic (congestive) heart failure: Secondary | ICD-10-CM | POA: Diagnosis not present

## 2024-02-28 DIAGNOSIS — E785 Hyperlipidemia, unspecified: Secondary | ICD-10-CM

## 2024-02-28 LAB — POCT GLYCOSYLATED HEMOGLOBIN (HGB A1C): Hemoglobin A1C: 7 % — AB (ref 4.0–5.6)

## 2024-02-28 MED ORDER — AZITHROMYCIN 250 MG PO TABS: ORAL_TABLET | ORAL | 0 refills | Status: AC

## 2024-02-28 NOTE — Addendum Note (Signed)
 Addended by: Oluwatimilehin Balfour on: 02/28/2024 01:38 PM   Modules accepted: Level of Service

## 2024-02-28 NOTE — Progress Notes (Addendum)
 [facility] [address] [place], [place] [address]  Internal MEDICINE  Office Visit Note  Patient Name: Judy Graves  [id number]  [id number]  Date of Service: 02/28/2024  Chief Complaint  Patient presents with   Follow-up   Diabetes   Gastroesophageal Reflux   Hypertension    HPI Pt is here for routine follow up -Drainage started Sunday, congestion and runny nose with coughing. Sore throat better with chloraseptic. Robutussin PM. Thought maybe a little better, but then worse again. No fever,chills, body aches, or sick contacts. Thinks weather related -reviewed all medications -Saw cardiology and is taking new medications, she reports she was still taking chlorthalidone  along with new spironolactone , but based on chart this was stopped and she will stop now and monitor. States she had jury duty when she was supposed to go back for follow up and will reach out to reschedule. -BP stable -tolerating her metformin , Farxiga  was too costly in past -eye exam is next tuesday  Current Medication: Outpatient Encounter Medications as of 02/28/2024  Medication Sig Note   aspirin  (ASPIRIN  LOW DOSE) 81 MG EC tablet Take 1 tablet (81 mg total) by mouth daily. Swallow whole.    atorvastatin  (LIPITOR) 40 MG tablet Take 1 tablet (40 mg total) by mouth daily.    azithromycin (ZITHROMAX) 250 MG tablet Take 2 tablets on day 1, then 1 tablet daily on days 2 through 5    EPIPEN  2-PAK 0.3 MG/0.3ML SOAJ injection AS DIRECTED AS DIRECTED INJECTION 2    levothyroxine  (SYNTHROID ) 50 MCG tablet TAKE 1 TABLET DAILY AND 2 TABLETS ON SUNDAYS    metFORMIN  (GLUCOPHAGE -XR) 500 MG 24 hr tablet Take 1 tablet (500 mg total) by mouth daily with breakfast.    metoprolol  succinate (TOPROL  XL) 100 MG 24 hr tablet Take 1 tablet (100 mg total) by mouth daily. Take with or immediately following a meal.    pantoprazole  (PROTONIX ) 40 MG tablet Take 1 tablet (40 mg total) by mouth daily.    spironolactone   (ALDACTONE ) 25 MG tablet Take 0.5 tablets (12.5 mg total) by mouth daily.    [DISCONTINUED] ergocalciferol  (DRISDOL ) 1.25 MG (50000 UT) capsule Take one cap q week 06/24/2023: HELD per labs   [DISCONTINUED] glucose blood (ACCU-CHEK GUIDE) test strip Use as instructed once daily. Dx e11.65    No facility-administered encounter medications on file as of 02/28/2024.    Surgical History: Past Surgical History:  Procedure Laterality Date   CARPAL TUNNEL RELEASE Right 2010   Dr. Cleotilde   CATARACT EXTRACTION W/PHACO Left 11/02/2015   Procedure: CATARACT EXTRACTION PHACO AND INTRAOCULAR LENS PLACEMENT (IOC) left eye;  Surgeon: Dene Etienne, MD;  Location: [facility];  Service: Ophthalmology;  Laterality: Left;   CATARACT EXTRACTION W/PHACO Right 02/07/2022   Procedure: CATARACT EXTRACTION PHACO AND INTRAOCULAR LENS PLACEMENT (IOC) RIGHT DIABETIC 6.41 00:43.7;  Surgeon: Etienne Dene, MD;  Location: [facility];  Service: Ophthalmology;  Laterality: Right;  Diabetic   CHONDROPLASTY Left 11/14/2015   Procedure: CHONDROPLASTY;  Surgeon: Lynwood SHAUNNA Hue, MD;  Location: [facility];  Service: Orthopedics;  Laterality: Left;   KNEE ARTHROSCOPY WITH LATERAL MENISECTOMY Left 11/14/2015   Procedure: KNEE ARTHROSCOPY WITH LATERAL MENISECTOMY;  Surgeon: Lynwood SHAUNNA Hue, MD;  Location: [facility];  Service: Orthopedics;  Laterality: Left;   KNEE ARTHROSCOPY WITH MEDIAL MENISECTOMY Left 11/14/2015   Procedure: KNEE ARTHROSCOPY WITH MEDIAL MENISECTOMY;  Surgeon: Lynwood SHAUNNA Hue, MD;  Location: [facility];  Service: Orthopedics;  Laterality: Left;  TONSILLECTOMY     TUBAL LIGATION      Medical History: Past Medical History:  Diagnosis Date   Allergy    Arthritis    in knee   Chronic constipation    Chronic insomnia    Dental bridge present    Permanent - top - front   Diabetes mellitus without complication (HCC)    GERD (gastroesophageal reflux disease)    Hemorrhoids    Hypertension     Hypothyroidism, adult    Impingement syndrome of right shoulder    [facility] subacromial bursitis and tendinitis right shoulder   Lumbar spinal stenosis    Dr. Claudie   Lump or mass in breast    Dr. Dellie evaluated, likely lipoma   Menopause    Metabolic syndrome    Mild carpal tunnel syndrome of right wrist    Obesity (BMI 35.0-39.9 without comorbidity)    Proteinuria     Family History: Family History  Problem Relation Age of Onset   Cancer Mother        Breast   Diabetes Mother    Breast cancer Mother    Hypertension Father    CVA Father        63's   Hypertension Sister    Hypertension Brother    Breast cancer Paternal Aunt     Social History   Socioeconomic History   Marital status: Married    Spouse name: Not on file   Number of children: Not on file   Years of education: Not on file   Highest education level: Not on file  Occupational History   Not on file  Tobacco Use   Smoking status: Never   Smokeless tobacco: Never  Vaping Use   Vaping status: Never Used  Substance and Sexual Activity   Alcohol use: No    Alcohol/week: 0.0 standard drinks of alcohol   Drug use: No   Sexual activity: Yes    Partners: Male  Other Topics Concern   Not on file  Social History Narrative   Not on file   Social Drivers of Health   Financial Resource Strain: Not on file  Food Insecurity: Not on file  Transportation Needs: Not on file  Physical Activity: Not on file  Stress: Not on file  Social Connections: Not on file  Intimate Partner Violence: Not on file      Review of Systems  Constitutional:  Negative for chills, diaphoresis and fatigue.  HENT:  Positive for congestion, postnasal drip, rhinorrhea, sinus pressure and sore throat. Negative for ear pain.   Eyes:  Negative for photophobia, discharge, redness, itching and visual disturbance.  Respiratory:  Positive for cough. Negative for shortness of breath and wheezing.   Cardiovascular:   Negative for chest pain.  Gastrointestinal:  Negative for abdominal pain, constipation, diarrhea, nausea and vomiting.  Genitourinary:  Negative for dysuria and flank pain.  Musculoskeletal:  Negative for back pain, gait problem and neck pain.  Skin:  Negative for color change.  Allergic/Immunologic: Negative for environmental allergies and food allergies.  Neurological:  Negative for dizziness and headaches.  Hematological:  Does not bruise/bleed easily.  Psychiatric/Behavioral:  Negative for agitation, behavioral problems (depression) and hallucinations.     Vital Signs: BP 132/85   Pulse 90   Temp 98.4 F (36.9 C)   Resp 16   Ht 5' 6 (1.676 m)   Wt 191 lb (86.6 kg)   SpO2 94%   BMI 30.83 kg/m  Physical Exam Vitals and nursing note reviewed.  Constitutional:      Appearance: Normal appearance.  HENT:     Head: Normocephalic and atraumatic.  Eyes:     Pupils: Pupils are equal, round, and reactive to light.  Cardiovascular:     Rate and Rhythm: Normal rate and regular rhythm.  Pulmonary:     Effort: Pulmonary effort is normal.     Breath sounds: Normal breath sounds.  Skin:    General: Skin is warm and dry.  Neurological:     General: No focal deficit present.     Mental Status: She is alert.  Psychiatric:        Mood and Affect: Mood normal.        Behavior: Behavior normal.        Assessment/Plan: 1. Type 2 diabetes mellitus with hyperglycemia, without long-term current use of insulin (HCC) (Primary) - POCT HgB A1C is 7.0 which is up slightly from 6.9 last check. Continue metformin . May retry for SGLT2 if generic becomes available to help with cost. Eye exam scheduled for next week  2. Benign hypertension Well controlled, but continue to monitor while holding chlorthalidone   3. CHF (congestive heart failure), NYHA class III, chronic, diastolic (HCC) Followed by cardiology and is now on spironolactone , will be scheduling follow up  4. Inappropriate  sinus tachycardia Followed by cardiology with increase metoprolol   5. Acute non-recurrent maxillary sinusitis Will send zpak that she may take if symptoms do not improve. May start mucinex and continue cough syrup - azithromycin (ZITHROMAX) 250 MG tablet; Take 2 tablets on day 1, then 1 tablet daily on days 2 through 5  Dispense: 6 tablet; Refill: 0  6. Dyslipidemia Continue atorvastatin  and will update labs - Lipid Panel With LDL/HDL Ratio  7. Hypothyroidism, unspecified type Will update labs and adjust synthroid  as indicated--taking 2 tabs on Sunday and 1 tab other days still - TSH + free T4  8. Other fatigue - CBC w/Diff/Platelet - Comprehensive metabolic panel with GFR - TSH + free T4 - Lipid Panel With LDL/HDL Ratio  9. B12 deficiency - B12 and Folate Panel  10. Vitamin D  deficiency - VITAMIN D  25 Hydroxy (Vit-D Deficiency, Fractures)   General Counseling: Avia verbalizes understanding of the findings of todays visit and agrees with plan of treatment. I have discussed any further diagnostic evaluation that may be needed or ordered today. We also reviewed her medications today. she has been encouraged to call the office with any questions or concerns that should arise related to todays visit.    Orders Placed This Encounter  Procedures   CBC w/Diff/Platelet   Comprehensive metabolic panel with GFR   TSH + free T4   Lipid Panel With LDL/HDL Ratio   B12 and Folate Panel   VITAMIN D  25 Hydroxy (Vit-D Deficiency, Fractures)   POCT HgB A1C    Meds ordered this encounter  Medications   azithromycin (ZITHROMAX) 250 MG tablet    Sig: Take 2 tablets on day 1, then 1 tablet daily on days 2 through 5    Dispense:  6 tablet    Refill:  0    This patient was seen by Tinnie Pro, PA-C in collaboration with Dr. Sigrid Bathe as a part of collaborative care agreement.   Total time spent:30 Minutes Time spent includes review of chart, medications, test results, and  follow up plan with the patient.      Dr Fozia M Khan Internal medicine

## 2024-03-03 DIAGNOSIS — Z961 Presence of intraocular lens: Secondary | ICD-10-CM | POA: Diagnosis not present

## 2024-03-03 DIAGNOSIS — H04123 Dry eye syndrome of bilateral lacrimal glands: Secondary | ICD-10-CM | POA: Diagnosis not present

## 2024-03-03 DIAGNOSIS — E119 Type 2 diabetes mellitus without complications: Secondary | ICD-10-CM | POA: Diagnosis not present

## 2024-03-03 LAB — HM DIABETES EYE EXAM

## 2024-03-06 ENCOUNTER — Encounter: Payer: Self-pay | Admitting: Cardiovascular Disease

## 2024-03-06 ENCOUNTER — Ambulatory Visit: Admitting: Cardiovascular Disease

## 2024-03-06 VITALS — BP 146/74 | HR 79 | Ht 66.0 in | Wt 196.4 lb

## 2024-03-06 DIAGNOSIS — E785 Hyperlipidemia, unspecified: Secondary | ICD-10-CM

## 2024-03-06 DIAGNOSIS — E782 Mixed hyperlipidemia: Secondary | ICD-10-CM | POA: Diagnosis not present

## 2024-03-06 DIAGNOSIS — I10 Essential (primary) hypertension: Secondary | ICD-10-CM

## 2024-03-06 DIAGNOSIS — I340 Nonrheumatic mitral (valve) insufficiency: Secondary | ICD-10-CM

## 2024-03-06 DIAGNOSIS — R0602 Shortness of breath: Secondary | ICD-10-CM

## 2024-03-06 DIAGNOSIS — R0789 Other chest pain: Secondary | ICD-10-CM | POA: Diagnosis not present

## 2024-03-06 DIAGNOSIS — I5033 Acute on chronic diastolic (congestive) heart failure: Secondary | ICD-10-CM

## 2024-03-06 DIAGNOSIS — I4711 Inappropriate sinus tachycardia, so stated: Secondary | ICD-10-CM

## 2024-03-06 MED ORDER — LOSARTAN POTASSIUM 25 MG PO TABS: 25.0000 mg | ORAL_TABLET | Freq: Every day | ORAL | 1 refills | Status: DC

## 2024-03-06 MED ORDER — ATORVASTATIN CALCIUM 40 MG PO TABS: 40.0000 mg | ORAL_TABLET | Freq: Every day | ORAL | 3 refills | Status: AC

## 2024-03-06 NOTE — Progress Notes (Signed)
 Cardiology Office Note   Date:  03/06/2024   ID:  Judy, Graves [birth year], MRN [id number]  PCP:  Kristina Tinnie POUR, PA-C  Cardiologist:  Denyse Bathe, MD      History of Present Illness: Judy Graves is a 68 y.o. female who presents for  Chief Complaint  Patient presents with   Follow-up    Follow up    SOB is better, no chest pains.      Past Medical History:  Diagnosis Date   Allergy    Arthritis    in knee   Chronic constipation    Chronic insomnia    Dental bridge present    Permanent - top - front   Diabetes mellitus without complication (HCC)    GERD (gastroesophageal reflux disease)    Hemorrhoids    Hypertension    Hypothyroidism, adult    Impingement syndrome of right shoulder    [facility] subacromial bursitis and tendinitis right shoulder   Lumbar spinal stenosis    Dr. Claudie   Lump or mass in breast    Dr. Dellie evaluated, likely lipoma   Menopause    Metabolic syndrome    Mild carpal tunnel syndrome of right wrist    Obesity (BMI 35.0-39.9 without comorbidity)    Proteinuria      Past Surgical History:  Procedure Laterality Date   CARPAL TUNNEL RELEASE Right 2010   Dr. Cleotilde   CATARACT EXTRACTION W/PHACO Left 11/02/2015   Procedure: CATARACT EXTRACTION PHACO AND INTRAOCULAR LENS PLACEMENT (IOC) left eye;  Surgeon: Dene Etienne, MD;  Location: [facility];  Service: Ophthalmology;  Laterality: Left;   CATARACT EXTRACTION W/PHACO Right 02/07/2022   Procedure: CATARACT EXTRACTION PHACO AND INTRAOCULAR LENS PLACEMENT (IOC) RIGHT DIABETIC 6.41 00:43.7;  Surgeon: Etienne Dene, MD;  Location: [facility];  Service: Ophthalmology;  Laterality: Right;  Diabetic   CHONDROPLASTY Left 11/14/2015   Procedure: CHONDROPLASTY;  Surgeon: Lynwood SHAUNNA Hue, MD;  Location: [facility];  Service: Orthopedics;  Laterality: Left;   KNEE ARTHROSCOPY WITH LATERAL MENISECTOMY Left 11/14/2015   Procedure: KNEE ARTHROSCOPY  WITH LATERAL MENISECTOMY;  Surgeon: Lynwood SHAUNNA Hue, MD;  Location: [facility];  Service: Orthopedics;  Laterality: Left;   KNEE ARTHROSCOPY WITH MEDIAL MENISECTOMY Left 11/14/2015   Procedure: KNEE ARTHROSCOPY WITH MEDIAL MENISECTOMY;  Surgeon: Lynwood SHAUNNA Hue, MD;  Location: [facility];  Service: Orthopedics;  Laterality: Left;   TONSILLECTOMY     TUBAL LIGATION       Current Outpatient Medications  Medication Sig Dispense Refill   aspirin  (ASPIRIN  LOW DOSE) 81 MG EC tablet Take 1 tablet (81 mg total) by mouth daily. Swallow whole. 90 tablet 3   EPIPEN  2-PAK 0.3 MG/0.3ML SOAJ injection AS DIRECTED AS DIRECTED INJECTION 2  1   levothyroxine  (SYNTHROID ) 50 MCG tablet TAKE 1 TABLET DAILY AND 2 TABLETS ON SUNDAYS 102 tablet 1   losartan (COZAAR) 25 MG tablet Take 1 tablet (25 mg total) by mouth daily. 90 tablet 1   metFORMIN  (GLUCOPHAGE -XR) 500 MG 24 hr tablet Take 1 tablet (500 mg total) by mouth daily with breakfast. 90 tablet 3   metoprolol  succinate (TOPROL  XL) 100 MG 24 hr tablet Take 1 tablet (100 mg total) by mouth daily. Take with or immediately following a meal. 30 tablet 11   pantoprazole  (PROTONIX ) 40 MG tablet Take 1 tablet (40 mg total) by mouth daily. 30 tablet 11   spironolactone  (ALDACTONE ) 25 MG tablet Take 0.5 tablets (12.5  mg total) by mouth daily. 30 tablet 11   atorvastatin  (LIPITOR) 40 MG tablet Take 1 tablet (40 mg total) by mouth daily. 90 tablet 3   No current facility-administered medications for this visit.    Allergies:   Ace inhibitors, Amlodipine , and Shrimp (diagnostic)    Social History:   reports that she has never smoked. She has never used smokeless tobacco. She reports that she does not drink alcohol and does not use drugs.   Family History:  family history includes Breast cancer in her mother and paternal aunt; CVA in her father; Cancer in her mother; Diabetes in her mother; Hypertension in her brother, father, and sister.    ROS:     Review of Systems   Constitutional: Negative.   HENT: Negative.    Eyes: Negative.   Respiratory: Negative.    Gastrointestinal: Negative.   Genitourinary: Negative.   Musculoskeletal: Negative.   Skin: Negative.   Neurological: Negative.   Endo/Heme/Allergies: Negative.   Psychiatric/Behavioral: Negative.    All other systems reviewed and are negative.     All other systems are reviewed and negative.    PHYSICAL EXAM: VS:  BP (!) 146/74   Pulse 79   Ht 5' 6 (1.676 m)   Wt 196 lb 6.4 oz (89.1 kg)   SpO2 96%   BMI 31.70 kg/m  , BMI Body mass index is 31.7 kg/m. Last weight:  Wt Readings from Last 3 Encounters:  03/06/24 196 lb 6.4 oz (89.1 kg)  02/28/24 191 lb (86.6 kg)  11/18/23 194 lb (88 kg)     Physical Exam Constitutional:      Appearance: Normal appearance.  Cardiovascular:     Rate and Rhythm: Normal rate and regular rhythm.     Heart sounds: Normal heart sounds.  Pulmonary:     Effort: Pulmonary effort is normal.     Breath sounds: Normal breath sounds.  Musculoskeletal:     Right lower leg: No edema.     Left lower leg: No edema.  Neurological:     Mental Status: She is alert.       EKG:   Recent Labs: 05/16/2023: ALT 28; BUN 14; Creatinine, Ser 1.11; Hemoglobin 14.1; Platelets 213; Potassium 3.9; Sodium 140; TSH 2.560    Lipid Panel    Component Value Date/Time   CHOL 159 05/16/2023 1038   TRIG 109 05/16/2023 1038   HDL 44 05/16/2023 1038   CHOLHDL 3.0 06/14/2015 1058   LDLCALC 95 05/16/2023 1038      Other studies Reviewed: Additional studies/ records that were reviewed today include:  Review of the above records demonstrates:       No data to display            ASSESSMENT AND PLAN:    ICD-10-CM   1. Mixed hyperlipidemia  E78.2 losartan (COZAAR) 25 MG tablet    2. Other chest pain  R07.89 losartan (COZAAR) 25 MG tablet    3. SOB (shortness of breath)  R06.02 losartan (COZAAR) 25 MG tablet   Grade 1 diastolic dysfunction. Aldactone   helping, as insurance would not pay farxiga .    4. CHF (congestive heart failure), NYHA class III, acute on chronic, diastolic (HCC)  I50.33 losartan (COZAAR) 25 MG tablet    5. Inappropriate sinus tachycardia  I47.11 losartan (COZAAR) 25 MG tablet    6. Nonrheumatic mitral valve regurgitation  I34.0 losartan (COZAAR) 25 MG tablet   Normal LVEF, trace MR on echo.    7.  Dyslipidemia  E78.5 atorvastatin  (LIPITOR) 40 MG tablet    losartan (COZAAR) 25 MG tablet    8. Primary hypertension  I10    BP elevated, losartan 25.       Problem List Items Addressed This Visit       Other   Dyslipidemia   Relevant Medications   atorvastatin  (LIPITOR) 40 MG tablet   losartan (COZAAR) 25 MG tablet   Other Visit Diagnoses       Mixed hyperlipidemia    -  Primary   Relevant Medications   atorvastatin  (LIPITOR) 40 MG tablet   losartan (COZAAR) 25 MG tablet     Other chest pain       Relevant Medications   losartan (COZAAR) 25 MG tablet     SOB (shortness of breath)       Grade 1 diastolic dysfunction. Aldactone  helping, as insurance would not pay farxiga .   Relevant Medications   losartan (COZAAR) 25 MG tablet     CHF (congestive heart failure), NYHA class III, acute on chronic, diastolic (HCC)       Relevant Medications   atorvastatin  (LIPITOR) 40 MG tablet   losartan (COZAAR) 25 MG tablet     Inappropriate sinus tachycardia       Relevant Medications   atorvastatin  (LIPITOR) 40 MG tablet   losartan (COZAAR) 25 MG tablet     Nonrheumatic mitral valve regurgitation       Normal LVEF, trace MR on echo.   Relevant Medications   atorvastatin  (LIPITOR) 40 MG tablet   losartan (COZAAR) 25 MG tablet     Primary hypertension       BP elevated, losartan 25.   Relevant Medications   atorvastatin  (LIPITOR) 40 MG tablet   losartan (COZAAR) 25 MG tablet          Disposition:   No follow-ups on file.    Total time spent: 35 minutes  Signed,  Denyse Bathe, MD  03/06/2024 11:00 AM     [facility]

## 2024-05-09 LAB — CBC WITH DIFFERENTIAL/PLATELET
Basophils Absolute: 0 x10E3/uL (ref 0.0–0.2)
Basos: 0 %
EOS (ABSOLUTE): 0.1 x10E3/uL (ref 0.0–0.4)
Eos: 1 %
Hematocrit: 42.7 % (ref 34.0–46.6)
Hemoglobin: 14.6 g/dL (ref 11.1–15.9)
Immature Grans (Abs): 0 x10E3/uL (ref 0.0–0.1)
Immature Granulocytes: 0 %
Lymphocytes Absolute: 3.2 x10E3/uL — ABNORMAL HIGH (ref 0.7–3.1)
Lymphs: 41 %
MCH: 31.5 pg (ref 26.6–33.0)
MCHC: 34.2 g/dL (ref 31.5–35.7)
MCV: 92 fL (ref 79–97)
Monocytes Absolute: 0.8 x10E3/uL (ref 0.1–0.9)
Monocytes: 10 %
Neutrophils Absolute: 3.7 x10E3/uL (ref 1.4–7.0)
Neutrophils: 48 %
Platelets: 197 x10E3/uL (ref 150–450)
RBC: 4.63 x10E6/uL (ref 3.77–5.28)
RDW: 12.4 % (ref 11.7–15.4)
WBC: 7.8 x10E3/uL (ref 3.4–10.8)

## 2024-05-09 LAB — LIPID PANEL WITH LDL/HDL RATIO
Cholesterol, Total: 150 mg/dL (ref 100–199)
HDL: 52 mg/dL
LDL Chol Calc (NIH): 84 mg/dL (ref 0–99)
LDL/HDL Ratio: 1.6 ratio (ref 0.0–3.2)
Triglycerides: 72 mg/dL (ref 0–149)
VLDL Cholesterol Cal: 14 mg/dL (ref 5–40)

## 2024-05-09 LAB — COMPREHENSIVE METABOLIC PANEL WITH GFR
ALT: 20 IU/L (ref 0–32)
AST: 14 IU/L (ref 0–40)
Albumin: 4.3 g/dL (ref 3.9–4.9)
Alkaline Phosphatase: 78 IU/L (ref 49–135)
BUN/Creatinine Ratio: 18 (ref 12–28)
BUN: 17 mg/dL (ref 8–27)
Bilirubin Total: 0.5 mg/dL (ref 0.0–1.2)
CO2: 26 mmol/L (ref 20–29)
Calcium: 9.7 mg/dL (ref 8.7–10.3)
Chloride: 103 mmol/L (ref 96–106)
Creatinine, Ser: 0.96 mg/dL (ref 0.57–1.00)
Globulin, Total: 2.4 g/dL (ref 1.5–4.5)
Glucose: 128 mg/dL — ABNORMAL HIGH (ref 70–99)
Potassium: 4 mmol/L (ref 3.5–5.2)
Sodium: 143 mmol/L (ref 134–144)
Total Protein: 6.7 g/dL (ref 6.0–8.5)
eGFR: 65 mL/min/1.73

## 2024-05-09 LAB — VITAMIN D 25 HYDROXY (VIT D DEFICIENCY, FRACTURES): Vit D, 25-Hydroxy: 23.7 ng/mL — ABNORMAL LOW (ref 30.0–100.0)

## 2024-05-09 LAB — TSH+FREE T4
Free T4: 1.46 ng/dL (ref 0.82–1.77)
TSH: 2.35 u[IU]/mL (ref 0.450–4.500)

## 2024-05-09 LAB — B12 AND FOLATE PANEL
Folate: 13 ng/mL
Vitamin B-12: 376 pg/mL (ref 232–1245)

## 2024-05-12 ENCOUNTER — Encounter: Payer: Self-pay | Admitting: Cardiovascular Disease

## 2024-05-12 ENCOUNTER — Ambulatory Visit: Admitting: Cardiovascular Disease

## 2024-05-12 VITALS — BP 160/82 | HR 80 | Ht 66.0 in | Wt 192.4 lb

## 2024-05-12 DIAGNOSIS — R9431 Abnormal electrocardiogram [ECG] [EKG]: Secondary | ICD-10-CM | POA: Diagnosis not present

## 2024-05-12 DIAGNOSIS — E785 Hyperlipidemia, unspecified: Secondary | ICD-10-CM | POA: Diagnosis not present

## 2024-05-12 DIAGNOSIS — I5033 Acute on chronic diastolic (congestive) heart failure: Secondary | ICD-10-CM

## 2024-05-12 DIAGNOSIS — E782 Mixed hyperlipidemia: Secondary | ICD-10-CM

## 2024-05-12 DIAGNOSIS — R0789 Other chest pain: Secondary | ICD-10-CM

## 2024-05-12 DIAGNOSIS — I340 Nonrheumatic mitral (valve) insufficiency: Secondary | ICD-10-CM

## 2024-05-12 DIAGNOSIS — I10 Essential (primary) hypertension: Secondary | ICD-10-CM

## 2024-05-12 DIAGNOSIS — R0602 Shortness of breath: Secondary | ICD-10-CM | POA: Diagnosis not present

## 2024-05-12 DIAGNOSIS — I4711 Inappropriate sinus tachycardia, so stated: Secondary | ICD-10-CM | POA: Diagnosis not present

## 2024-05-12 DIAGNOSIS — E119 Type 2 diabetes mellitus without complications: Secondary | ICD-10-CM

## 2024-05-12 MED ORDER — LOSARTAN POTASSIUM-HCTZ 50-12.5 MG PO TABS: 1.0000 | ORAL_TABLET | Freq: Every day | ORAL | 11 refills | Status: AC

## 2024-05-12 NOTE — Progress Notes (Signed)
 "     Cardiology Office Note   Date:  05/12/2024   ID:  Judy Graves, DOB 13-Sep-1955, MRN [id number]  PCP:  Kristina Tinnie POUR, PA-C  Cardiologist:  Denyse Bathe, MD      History of Present Illness: Judy Graves is a 69 y.o. female who presents for  Chief Complaint  Patient presents with   Follow-up    2 month follow up    Patient has occasional SOB and chest pain. BP high.      Past Medical History:  Diagnosis Date   Allergy    Arthritis    in knee   Chronic constipation    Chronic insomnia    Dental bridge present    Permanent - top - front   Diabetes mellitus without complication (HCC)    GERD (gastroesophageal reflux disease)    Hemorrhoids    Hypertension    Hypothyroidism, adult    Impingement syndrome of right shoulder    [facility] subacromial bursitis and tendinitis right shoulder   Lumbar spinal stenosis    Dr. Claudie   Lump or mass in breast    Dr. Dellie evaluated, likely lipoma   Menopause    Metabolic syndrome    Mild carpal tunnel syndrome of right wrist    Obesity (BMI 35.0-39.9 without comorbidity)    Proteinuria      Past Surgical History:  Procedure Laterality Date   CARPAL TUNNEL RELEASE Right 2010   Dr. Cleotilde   CATARACT EXTRACTION W/PHACO Left 11/02/2015   Procedure: CATARACT EXTRACTION PHACO AND INTRAOCULAR LENS PLACEMENT (IOC) left eye;  Surgeon: Dene Etienne, MD;  Location: [facility];  Service: Ophthalmology;  Laterality: Left;   CATARACT EXTRACTION W/PHACO Right 02/07/2022   Procedure: CATARACT EXTRACTION PHACO AND INTRAOCULAR LENS PLACEMENT (IOC) RIGHT DIABETIC 6.41 00:43.7;  Surgeon: Etienne Dene, MD;  Location: [facility];  Service: Ophthalmology;  Laterality: Right;  Diabetic   CHONDROPLASTY Left 11/14/2015   Procedure: CHONDROPLASTY;  Surgeon: Lynwood SHAUNNA Hue, MD;  Location: [facility];  Service: Orthopedics;  Laterality: Left;   KNEE ARTHROSCOPY WITH LATERAL MENISECTOMY Left 11/14/2015    Procedure: KNEE ARTHROSCOPY WITH LATERAL MENISECTOMY;  Surgeon: Lynwood SHAUNNA Hue, MD;  Location: [facility];  Service: Orthopedics;  Laterality: Left;   KNEE ARTHROSCOPY WITH MEDIAL MENISECTOMY Left 11/14/2015   Procedure: KNEE ARTHROSCOPY WITH MEDIAL MENISECTOMY;  Surgeon: Lynwood SHAUNNA Hue, MD;  Location: [facility];  Service: Orthopedics;  Laterality: Left;   TONSILLECTOMY     TUBAL LIGATION       Current Outpatient Medications  Medication Sig Dispense Refill   aspirin  (ASPIRIN  LOW DOSE) 81 MG EC tablet Take 1 tablet (81 mg total) by mouth daily. Swallow whole. 90 tablet 3   atorvastatin  (LIPITOR) 40 MG tablet Take 1 tablet (40 mg total) by mouth daily. 90 tablet 3   EPIPEN  2-PAK 0.3 MG/0.3ML SOAJ injection AS DIRECTED AS DIRECTED INJECTION 2  1   levothyroxine  (SYNTHROID ) 50 MCG tablet TAKE 1 TABLET DAILY AND 2 TABLETS ON SUNDAYS 102 tablet 1   metFORMIN  (GLUCOPHAGE -XR) 500 MG 24 hr tablet Take 1 tablet (500 mg total) by mouth daily with breakfast. 90 tablet 3   metoprolol  succinate (TOPROL  XL) 100 MG 24 hr tablet Take 1 tablet (100 mg total) by mouth daily. Take with or immediately following a meal. 30 tablet 11   pantoprazole  (PROTONIX ) 40 MG tablet Take 1 tablet (40 mg total) by mouth daily. 30 tablet 11   spironolactone  (  ALDACTONE ) 25 MG tablet Take 0.5 tablets (12.5 mg total) by mouth daily. 30 tablet 11   losartan -hydrochlorothiazide  (HYZAAR) 50-12.5 MG tablet Take 1 tablet by mouth daily. 30 tablet 11   No current facility-administered medications for this visit.    Allergies:   Ace inhibitors, Amlodipine , and Shrimp (diagnostic)    Social History:   reports that she has never smoked. She has never used smokeless tobacco. She reports that she does not drink alcohol and does not use drugs.   Family History:  family history includes Breast cancer in her mother and paternal aunt; CVA in her father; Cancer in her mother; Diabetes in her mother; Hypertension in her brother, father, and  sister.    ROS:     ROS    All other systems are reviewed and negative.    PHYSICAL EXAM: VS:  BP (!) 160/82   Pulse 80   Ht 5' 6 (1.676 m)   Wt 192 lb 6.4 oz (87.3 kg)   SpO2 99%   BMI 31.05 kg/m  , BMI Body mass index is 31.05 kg/m. Last weight:  Wt Readings from Last 3 Encounters:  05/12/24 192 lb 6.4 oz (87.3 kg)  03/06/24 196 lb 6.4 oz (89.1 kg)  02/28/24 191 lb (86.6 kg)     Physical Exam    EKG:   Recent Labs: 05/08/2024: ALT 20; BUN 17; Creatinine, Ser 0.96; Hemoglobin 14.6; Platelets 197; Potassium 4.0; Sodium 143; TSH 2.350    Lipid Panel    Component Value Date/Time   CHOL 150 05/08/2024 1045   TRIG 72 05/08/2024 1045   HDL 52 05/08/2024 1045   CHOLHDL 3.0 06/14/2015 1058   LDLCALC 84 05/08/2024 1045      Other studies Reviewed: Additional studies/ records that were reviewed today include:  Review of the above records demonstrates:       No data to display            ASSESSMENT AND PLAN:    ICD-10-CM   1. CHF (congestive heart failure), NYHA class III, acute on chronic, diastolic (HCC)  I50.33 losartan -hydrochlorothiazide  (HYZAAR) 50-12.5 MG tablet    2. SOB (shortness of breath)  R06.02 losartan -hydrochlorothiazide  (HYZAAR) 50-12.5 MG tablet    3. Other chest pain  R07.89 losartan -hydrochlorothiazide  (HYZAAR) 50-12.5 MG tablet    4. Mixed hyperlipidemia  E78.2 losartan -hydrochlorothiazide  (HYZAAR) 50-12.5 MG tablet    5. Inappropriate sinus tachycardia  I47.11 losartan -hydrochlorothiazide  (HYZAAR) 50-12.5 MG tablet    6. Nonrheumatic mitral valve regurgitation  I34.0 losartan -hydrochlorothiazide  (HYZAAR) 50-12.5 MG tablet    7. Dyslipidemia  E78.5 losartan -hydrochlorothiazide  (HYZAAR) 50-12.5 MG tablet    8. Primary hypertension  I10 losartan -hydrochlorothiazide  (HYZAAR) 50-12.5 MG tablet   Change losartn 25 to hyzaar 50/12.5 daily, recheck BP 2 weeks.    9. Benign hypertension  I10 losartan -hydrochlorothiazide  (HYZAAR)  50-12.5 MG tablet    10. Abnormal EKG  R94.31 losartan -hydrochlorothiazide  (HYZAAR) 50-12.5 MG tablet    11. Diabetes mellitus without complication (HCC)  E11.9 losartan -hydrochlorothiazide  (HYZAAR) 50-12.5 MG tablet       Problem List Items Addressed This Visit       Cardiovascular and Mediastinum   Benign hypertension   Relevant Medications   losartan -hydrochlorothiazide  (HYZAAR) 50-12.5 MG tablet     Endocrine   Diabetes mellitus without complication (HCC)   Relevant Medications   losartan -hydrochlorothiazide  (HYZAAR) 50-12.5 MG tablet     Other   Dyslipidemia   Relevant Medications   losartan -hydrochlorothiazide  (HYZAAR) 50-12.5 MG tablet   Other Visit Diagnoses  CHF (congestive heart failure), NYHA class III, acute on chronic, diastolic (HCC)    -  Primary   Relevant Medications   losartan -hydrochlorothiazide  (HYZAAR) 50-12.5 MG tablet     SOB (shortness of breath)       Relevant Medications   losartan -hydrochlorothiazide  (HYZAAR) 50-12.5 MG tablet     Other chest pain       Relevant Medications   losartan -hydrochlorothiazide  (HYZAAR) 50-12.5 MG tablet     Mixed hyperlipidemia       Relevant Medications   losartan -hydrochlorothiazide  (HYZAAR) 50-12.5 MG tablet     Inappropriate sinus tachycardia       Relevant Medications   losartan -hydrochlorothiazide  (HYZAAR) 50-12.5 MG tablet     Nonrheumatic mitral valve regurgitation       Relevant Medications   losartan -hydrochlorothiazide  (HYZAAR) 50-12.5 MG tablet     Primary hypertension       Change losartn 25 to hyzaar 50/12.5 daily, recheck BP 2 weeks.   Relevant Medications   losartan -hydrochlorothiazide  (HYZAAR) 50-12.5 MG tablet     Abnormal EKG       Relevant Medications   losartan -hydrochlorothiazide  (HYZAAR) 50-12.5 MG tablet          Disposition:   Return in about 2 weeks (around 05/26/2024).    Total time spent: 40 minutes  Signed,  Denyse Bathe, MD  05/12/2024 10:50 AM    [facility] "

## 2024-05-18 ENCOUNTER — Encounter: Payer: Self-pay | Admitting: Physician Assistant

## 2024-05-18 ENCOUNTER — Ambulatory Visit (INDEPENDENT_AMBULATORY_CARE_PROVIDER_SITE_OTHER): Payer: Medicare Other | Admitting: Physician Assistant

## 2024-05-18 VITALS — BP 142/85 | HR 70 | Temp 97.8°F | Resp 16 | Ht 66.0 in | Wt 189.0 lb

## 2024-05-18 DIAGNOSIS — I10 Essential (primary) hypertension: Secondary | ICD-10-CM

## 2024-05-18 DIAGNOSIS — D72820 Lymphocytosis (symptomatic): Secondary | ICD-10-CM

## 2024-05-18 DIAGNOSIS — Z0001 Encounter for general adult medical examination with abnormal findings: Secondary | ICD-10-CM

## 2024-05-18 DIAGNOSIS — Z1211 Encounter for screening for malignant neoplasm of colon: Secondary | ICD-10-CM | POA: Diagnosis not present

## 2024-05-18 DIAGNOSIS — E559 Vitamin D deficiency, unspecified: Secondary | ICD-10-CM

## 2024-05-18 DIAGNOSIS — Z1231 Encounter for screening mammogram for malignant neoplasm of breast: Secondary | ICD-10-CM

## 2024-05-18 DIAGNOSIS — L602 Onychogryphosis: Secondary | ICD-10-CM | POA: Diagnosis not present

## 2024-05-18 DIAGNOSIS — E1165 Type 2 diabetes mellitus with hyperglycemia: Secondary | ICD-10-CM

## 2024-05-18 DIAGNOSIS — Z1212 Encounter for screening for malignant neoplasm of rectum: Secondary | ICD-10-CM

## 2024-05-18 MED ORDER — ERGOCALCIFEROL 1.25 MG (50000 UT) PO CAPS: ORAL_CAPSULE | ORAL | 3 refills | Status: DC

## 2024-05-18 NOTE — Progress Notes (Signed)
 [facility] [address] [place], [place] [address]  Internal MEDICINE  Office Visit Note  Patient Name: Judy Graves  [id number]  [id number]  Date of Service: 05/18/2024  Chief Complaint  Patient presents with   Diabetes   Gastroesophageal Reflux   Hypertension   Medicare Wellness    HPI Judy Graves presents for an annual well visit.  Well-appearing 69 y.o.female Routine CRC screening: Due, ordered Routine mammogram: Due in feb DEXA scan: UTD Eye exam and/or foot exam: Eye exam done in Oct Labs: vit D low--can't tolerate OTC vit D but does ok with drisdol  and will send. Other concerns: BP not checked at home, cardiology increased losartan  and has 2 week follow up for this next week -lymphocytes a little elevated again -a little dizzy Monday when at cardiology, a little funny feeling in back of head no sharp pain. It is around right occipital. Intermittent for a few mins. No vision changes. Figured it had to do with BP fluctuations. -states off feeling was before change in med dose. Dizziness better but still off.      05/18/2024    8:56 AM 05/16/2023    9:00 AM  MMSE - Mini Mental State Exam  Orientation to time 5 5  Orientation to Place 5 5  Registration 3 3  Attention/ Calculation 5 5  Recall 3 3  Language- name 2 objects 2 2  Language- repeat 1 1  Language- follow 3 step command 3 3  Language- read & follow direction 1 1  Write a sentence 1 1  Copy design 1 1  Total score 30 30    Functional Status Survey: Is the patient deaf or have difficulty hearing?: No Does the patient have difficulty seeing, even when wearing glasses/contacts?: No Does the patient have difficulty concentrating, remembering, or making decisions?: No Does the patient have difficulty walking or climbing stairs?: No Does the patient have difficulty dressing or bathing?: No Does the patient have difficulty doing errands alone such as visiting a doctor's office or shopping?:  No     12/27/2022    3:24 PM 05/16/2023    8:59 AM 09/23/2023   11:33 AM 02/28/2024   10:59 AM 05/18/2024    8:57 AM  Fall Risk  Falls in the past year? 0 0 0 0 0  Fall risk Follow up     Falls evaluation completed       05/18/2024    8:57 AM  Depression screen PHQ 2/9  Decreased Interest 0  Down, Depressed, Hopeless 0  PHQ - 2 Score 0        No data to display            Current Medication: Outpatient Encounter Medications as of 05/18/2024  Medication Sig   aspirin  (ASPIRIN  LOW DOSE) 81 MG EC tablet Take 1 tablet (81 mg total) by mouth daily. Swallow whole.   atorvastatin  (LIPITOR) 40 MG tablet Take 1 tablet (40 mg total) by mouth daily.   EPIPEN  2-PAK 0.3 MG/0.3ML SOAJ injection AS DIRECTED AS DIRECTED INJECTION 2   ergocalciferol  (DRISDOL ) 1.25 MG (50000 UT) capsule Take one cap q week   levothyroxine  (SYNTHROID ) 50 MCG tablet TAKE 1 TABLET DAILY AND 2 TABLETS ON SUNDAYS   losartan -hydrochlorothiazide  (HYZAAR) 50-12.5 MG tablet Take 1 tablet by mouth daily.   metFORMIN  (GLUCOPHAGE -XR) 500 MG 24 hr tablet Take 1 tablet (500 mg total) by mouth daily with breakfast.   metoprolol  succinate (TOPROL  XL) 100 MG 24 hr tablet  Take 1 tablet (100 mg total) by mouth daily. Take with or immediately following a meal.   pantoprazole  (PROTONIX ) 40 MG tablet Take 1 tablet (40 mg total) by mouth daily.   spironolactone  (ALDACTONE ) 25 MG tablet Take 0.5 tablets (12.5 mg total) by mouth daily.   No facility-administered encounter medications on file as of 05/18/2024.    Surgical History: Past Surgical History:  Procedure Laterality Date   CARPAL TUNNEL RELEASE Right 2010   Dr. Cleotilde   CATARACT EXTRACTION W/PHACO Left 11/02/2015   Procedure: CATARACT EXTRACTION PHACO AND INTRAOCULAR LENS PLACEMENT (IOC) left eye;  Surgeon: Dene Etienne, MD;  Location: [facility];  Service: Ophthalmology;  Laterality: Left;   CATARACT EXTRACTION W/PHACO Right 02/07/2022   Procedure:  CATARACT EXTRACTION PHACO AND INTRAOCULAR LENS PLACEMENT (IOC) RIGHT DIABETIC 6.41 00:43.7;  Surgeon: Etienne Dene, MD;  Location: [facility];  Service: Ophthalmology;  Laterality: Right;  Diabetic   CHONDROPLASTY Left 11/14/2015   Procedure: CHONDROPLASTY;  Surgeon: Lynwood SHAUNNA Hue, MD;  Location: [facility];  Service: Orthopedics;  Laterality: Left;   KNEE ARTHROSCOPY WITH LATERAL MENISECTOMY Left 11/14/2015   Procedure: KNEE ARTHROSCOPY WITH LATERAL MENISECTOMY;  Surgeon: Lynwood SHAUNNA Hue, MD;  Location: [facility];  Service: Orthopedics;  Laterality: Left;   KNEE ARTHROSCOPY WITH MEDIAL MENISECTOMY Left 11/14/2015   Procedure: KNEE ARTHROSCOPY WITH MEDIAL MENISECTOMY;  Surgeon: Lynwood SHAUNNA Hue, MD;  Location: [facility];  Service: Orthopedics;  Laterality: Left;   TONSILLECTOMY     TUBAL LIGATION      Medical History: Past Medical History:  Diagnosis Date   Allergy    Arthritis    in knee   Chronic constipation    Chronic insomnia    Dental bridge present    Permanent - top - front   Diabetes mellitus without complication (HCC)    GERD (gastroesophageal reflux disease)    Hemorrhoids    Hypertension    Hypothyroidism, adult    Impingement syndrome of right shoulder    [facility] subacromial bursitis and tendinitis right shoulder   Lumbar spinal stenosis    Dr. Claudie   Lump or mass in breast    Dr. Dellie evaluated, likely lipoma   Menopause    Metabolic syndrome    Mild carpal tunnel syndrome of right wrist    Obesity (BMI 35.0-39.9 without comorbidity)    Proteinuria     Family History: Family History  Problem Relation Age of Onset   Cancer Mother        Breast   Diabetes Mother    Breast cancer Mother    Hypertension Father    CVA Father        17's   Hypertension Sister    Hypertension Brother    Breast cancer Paternal Aunt     Social History   Socioeconomic History   Marital status: Married    Spouse name: Not on file   Number of  children: Not on file   Years of education: Not on file   Highest education level: Not on file  Occupational History   Not on file  Tobacco Use   Smoking status: Never   Smokeless tobacco: Never  Vaping Use   Vaping status: Never Used  Substance and Sexual Activity   Alcohol use: No    Alcohol/week: 0.0 standard drinks of alcohol   Drug use: No   Sexual activity: Yes    Partners: Male  Other Topics Concern   Not on file  Social History Narrative   Not on file   Social Drivers of Health   Tobacco Use: Low Risk (05/18/2024)   Patient History    Smoking Tobacco Use: Never    Smokeless Tobacco Use: Never    Passive Exposure: Not on file  Financial Resource Strain: Not on file  Food Insecurity: Not on file  Transportation Needs: Not on file  Physical Activity: Not on file  Stress: Not on file  Social Connections: Not on file  Intimate Partner Violence: Not on file  Depression (PHQ2-9): Low Risk (05/18/2024)   Depression (PHQ2-9)    PHQ-2 Score: 0  Alcohol Screen: Low Risk (11/03/2021)   Alcohol Screen    Last Alcohol Screening Score (AUDIT): 0  Housing: Not on file  Utilities: Not on file  Health Literacy: Not on file      Review of Systems  Constitutional:  Negative for chills, diaphoresis and fatigue.  HENT:  Negative for ear pain, postnasal drip and sinus pressure.   Eyes:  Negative for photophobia, discharge, redness, itching and visual disturbance.  Respiratory:  Negative for cough and wheezing.   Cardiovascular:  Negative for chest pain.  Gastrointestinal:  Negative for abdominal pain, constipation, diarrhea, nausea and vomiting.  Genitourinary:  Negative for dysuria and flank pain.  Musculoskeletal:  Negative for back pain, gait problem and neck pain.  Skin:  Negative for color change.  Allergic/Immunologic: Negative for environmental allergies and food allergies.  Neurological:  Positive for headaches. Negative for dizziness, syncope and weakness.        Back right of head intermittently feels off for a few mins at a time for the last week  Hematological:  Does not bruise/bleed easily.  Psychiatric/Behavioral:  Negative for agitation, behavioral problems (depression) and hallucinations.     Vital Signs: BP (!) 142/85   Pulse 70   Temp 97.8 F (36.6 C)   Resp 16   Ht 5' 6 (1.676 m)   Wt 189 lb (85.7 kg)   SpO2 97%   BMI 30.51 kg/m    Physical Exam Vitals and nursing note reviewed.  Constitutional:      Appearance: Normal appearance.  HENT:     Head: Normocephalic and atraumatic.     Comments: No tenderness, swelling, or skin changes over right occipital Eyes:     Extraocular Movements: Extraocular movements intact.  Cardiovascular:     Rate and Rhythm: Normal rate and regular rhythm.  Pulmonary:     Effort: Pulmonary effort is normal.     Breath sounds: Normal breath sounds.  Skin:    General: Skin is warm and dry.  Neurological:     General: No focal deficit present.     Mental Status: She is alert.  Psychiatric:        Mood and Affect: Mood normal.        Behavior: Behavior normal.        Assessment/Plan: 1. Encounter for Medicare annual examination with abnormal findings (Primary) AWV performed, labs reviewed, due for cologuard and mammogram soon  2. Type 2 diabetes mellitus with hyperglycemia, without long-term current use of insulin (HCC) Continue metformin  and work on diet and exercise - Microalbumin / creatinine urine ratio - Ambulatory referral to Podiatry  3. Benign hypertension Improving, but still borderline. Advised to monitor at home and has recheck with cardiology in 1 week to readdress since med change. Will order BMP to monitor electrolytes with this change. Advised to call if new or worsening symptoms along back  of head--may need CT. - Basic Metabolic Panel (BMET)  4. Screening for colorectal cancer - Cologuard  5. Visit for screening mammogram - MM 3D SCREENING MAMMOGRAM BILATERAL  BREAST; Future  6. Vitamin D  deficiency - ergocalciferol  (DRISDOL ) 1.25 MG (50000 UT) capsule; Take one cap q week  Dispense: 12 capsule; Refill: 3  7. Thickening of toenail - Ambulatory referral to Podiatry  8. Lymphocytosis Appears chronic for patient, may need to consider hematology in future     General Counseling: Channing oakland understanding of the findings of todays visit and agrees with plan of treatment. I have discussed any further diagnostic evaluation that may be needed or ordered today. We also reviewed her medications today. she has been encouraged to call the office with any questions or concerns that should arise related to todays visit.    Orders Placed This Encounter  Procedures   MM 3D SCREENING MAMMOGRAM BILATERAL BREAST   Microalbumin / creatinine urine ratio   Cologuard   Basic Metabolic Panel (BMET)   Ambulatory referral to Podiatry    Meds ordered this encounter  Medications   ergocalciferol  (DRISDOL ) 1.25 MG (50000 UT) capsule    Sig: Take one cap q week    Dispense:  12 capsule    Refill:  3    Return in about 4 weeks (around 06/15/2024) for A1c, lab review.   Total time spent:35 Minutes Time spent includes review of chart, medications, test results, and follow up plan with the patient.   [place] Controlled Substance Database was reviewed by me.  This patient was seen by Tinnie Pro, PA-C in collaboration with Dr. Sigrid Bathe as a part of collaborative care agreement.  Tinnie Pro, PA-C Internal medicine

## 2024-05-19 ENCOUNTER — Ambulatory Visit: Payer: Self-pay | Admitting: Physician Assistant

## 2024-05-19 LAB — BASIC METABOLIC PANEL WITH GFR
BUN/Creatinine Ratio: 17 (ref 12–28)
BUN: 19 mg/dL (ref 8–27)
CO2: 26 mmol/L (ref 20–29)
Calcium: 9.9 mg/dL (ref 8.7–10.3)
Chloride: 103 mmol/L (ref 96–106)
Creatinine, Ser: 1.12 mg/dL — ABNORMAL HIGH (ref 0.57–1.00)
Glucose: 124 mg/dL — ABNORMAL HIGH (ref 70–99)
Potassium: 4.5 mmol/L (ref 3.5–5.2)
Sodium: 144 mmol/L (ref 134–144)
eGFR: 54 mL/min/1.73 — ABNORMAL LOW

## 2024-05-19 LAB — MICROALBUMIN / CREATININE URINE RATIO
Creatinine, Urine: 174.9 mg/dL
Microalb/Creat Ratio: 6 mg/g{creat} (ref 0–29)
Microalbumin, Urine: 10.9 ug/mL

## 2024-05-19 NOTE — Telephone Encounter (Signed)
 Patient notified.

## 2024-05-19 NOTE — Telephone Encounter (Signed)
-----   Message from Tinnie MARLA Pro sent at 05/19/2024 10:50 AM EST ----- Please let her know that electrolytes are normal, renal function down slightly again to where it has been previously.

## 2024-05-25 ENCOUNTER — Encounter: Payer: Self-pay | Admitting: Cardiovascular Disease

## 2024-05-25 ENCOUNTER — Ambulatory Visit: Admitting: Cardiovascular Disease

## 2024-05-25 VITALS — BP 128/92 | HR 76 | Ht 66.0 in | Wt 192.0 lb

## 2024-05-25 DIAGNOSIS — I4711 Inappropriate sinus tachycardia, so stated: Secondary | ICD-10-CM | POA: Diagnosis not present

## 2024-05-25 DIAGNOSIS — I5033 Acute on chronic diastolic (congestive) heart failure: Secondary | ICD-10-CM

## 2024-05-25 DIAGNOSIS — R0789 Other chest pain: Secondary | ICD-10-CM

## 2024-05-25 DIAGNOSIS — E785 Hyperlipidemia, unspecified: Secondary | ICD-10-CM

## 2024-05-25 DIAGNOSIS — I10 Essential (primary) hypertension: Secondary | ICD-10-CM

## 2024-05-25 DIAGNOSIS — E782 Mixed hyperlipidemia: Secondary | ICD-10-CM

## 2024-05-25 DIAGNOSIS — I340 Nonrheumatic mitral (valve) insufficiency: Secondary | ICD-10-CM

## 2024-05-25 DIAGNOSIS — R0602 Shortness of breath: Secondary | ICD-10-CM

## 2024-05-25 NOTE — Progress Notes (Signed)
 "     Cardiology Office Note   Date:  05/25/2024   ID:  Judy Graves, DOB [birth year], MRN [id number]  PCP:  Kristina Tinnie POUR, PA-C  Cardiologist:  Denyse Bathe, MD      History of Present Illness: Judy Graves is a 69 y.o. female who presents for  Chief Complaint  Patient presents with   Follow-up    2 week follow up    No chest pains or SOB.      Past Medical History:  Diagnosis Date   Allergy    Arthritis    in knee   Chronic constipation    Chronic insomnia    Dental bridge present    Permanent - top - front   Diabetes mellitus without complication (HCC)    GERD (gastroesophageal reflux disease)    Hemorrhoids    Hypertension    Hypothyroidism, adult    Impingement syndrome of right shoulder    [facility] subacromial bursitis and tendinitis right shoulder   Lumbar spinal stenosis    Dr. Claudie   Lump or mass in breast    Dr. Dellie evaluated, likely lipoma   Menopause    Metabolic syndrome    Mild carpal tunnel syndrome of right wrist    Obesity (BMI 35.0-39.9 without comorbidity)    Proteinuria      Past Surgical History:  Procedure Laterality Date   CARPAL TUNNEL RELEASE Right 2010   Dr. Cleotilde   CATARACT EXTRACTION W/PHACO Left 11/02/2015   Procedure: CATARACT EXTRACTION PHACO AND INTRAOCULAR LENS PLACEMENT (IOC) left eye;  Surgeon: Dene Etienne, MD;  Location: [facility];  Service: Ophthalmology;  Laterality: Left;   CATARACT EXTRACTION W/PHACO Right 02/07/2022   Procedure: CATARACT EXTRACTION PHACO AND INTRAOCULAR LENS PLACEMENT (IOC) RIGHT DIABETIC 6.41 00:43.7;  Surgeon: Etienne Dene, MD;  Location: [facility];  Service: Ophthalmology;  Laterality: Right;  Diabetic   CHONDROPLASTY Left 11/14/2015   Procedure: CHONDROPLASTY;  Surgeon: Lynwood SHAUNNA Hue, MD;  Location: [facility];  Service: Orthopedics;  Laterality: Left;   KNEE ARTHROSCOPY WITH LATERAL MENISECTOMY Left 11/14/2015   Procedure: KNEE ARTHROSCOPY  WITH LATERAL MENISECTOMY;  Surgeon: Lynwood SHAUNNA Hue, MD;  Location: [facility];  Service: Orthopedics;  Laterality: Left;   KNEE ARTHROSCOPY WITH MEDIAL MENISECTOMY Left 11/14/2015   Procedure: KNEE ARTHROSCOPY WITH MEDIAL MENISECTOMY;  Surgeon: Lynwood SHAUNNA Hue, MD;  Location: [facility];  Service: Orthopedics;  Laterality: Left;   TONSILLECTOMY     TUBAL LIGATION       Current Outpatient Medications  Medication Sig Dispense Refill   aspirin  (ASPIRIN  LOW DOSE) 81 MG EC tablet Take 1 tablet (81 mg total) by mouth daily. Swallow whole. 90 tablet 3   atorvastatin  (LIPITOR) 40 MG tablet Take 1 tablet (40 mg total) by mouth daily. 90 tablet 3   EPIPEN  2-PAK 0.3 MG/0.3ML SOAJ injection AS DIRECTED AS DIRECTED INJECTION 2  1   levothyroxine  (SYNTHROID ) 50 MCG tablet TAKE 1 TABLET DAILY AND 2 TABLETS ON SUNDAYS 102 tablet 1   losartan -hydrochlorothiazide  (HYZAAR) 50-12.5 MG tablet Take 1 tablet by mouth daily. 30 tablet 11   metFORMIN  (GLUCOPHAGE -XR) 500 MG 24 hr tablet Take 1 tablet (500 mg total) by mouth daily with breakfast. 90 tablet 3   metoprolol  succinate (TOPROL  XL) 100 MG 24 hr tablet Take 1 tablet (100 mg total) by mouth daily. Take with or immediately following a meal. 30 tablet 11   pantoprazole  (PROTONIX ) 40 MG tablet Take 1 tablet (  40 mg total) by mouth daily. 30 tablet 11   spironolactone  (ALDACTONE ) 25 MG tablet Take 0.5 tablets (12.5 mg total) by mouth daily. 30 tablet 11   No current facility-administered medications for this visit.    Allergies:   Ace inhibitors, Amlodipine , and Shrimp (diagnostic)    Social History:   reports that she has never smoked. She has never used smokeless tobacco. She reports that she does not drink alcohol and does not use drugs.   Family History:  family history includes Breast cancer in her mother and paternal aunt; CVA in her father; Cancer in her mother; Diabetes in her mother; Hypertension in her brother, father, and sister.    ROS:     Review of  Systems  Constitutional: Negative.   HENT: Negative.    Eyes: Negative.   Respiratory: Negative.    Gastrointestinal: Negative.   Genitourinary: Negative.   Musculoskeletal: Negative.   Skin: Negative.   Neurological: Negative.   Endo/Heme/Allergies: Negative.   Psychiatric/Behavioral: Negative.    All other systems reviewed and are negative.     All other systems are reviewed and negative.    PHYSICAL EXAM: VS:  BP (!) 128/92   Pulse 76   Ht 5' 6 (1.676 m)   Wt 192 lb (87.1 kg)   SpO2 94%   BMI 30.99 kg/m  , BMI Body mass index is 30.99 kg/m. Last weight:  Wt Readings from Last 3 Encounters:  05/25/24 192 lb (87.1 kg)  05/18/24 189 lb (85.7 kg)  05/12/24 192 lb 6.4 oz (87.3 kg)     Physical Exam Constitutional:      Appearance: Normal appearance.  Cardiovascular:     Rate and Rhythm: Normal rate and regular rhythm.     Heart sounds: Normal heart sounds.  Pulmonary:     Effort: Pulmonary effort is normal.     Breath sounds: Normal breath sounds.  Musculoskeletal:     Right lower leg: No edema.     Left lower leg: No edema.  Neurological:     Mental Status: She is alert.       EKG:   Recent Labs: 05/08/2024: ALT 20; Hemoglobin 14.6; Platelets 197; TSH 2.350 05/18/2024: BUN 19; Creatinine, Ser 1.12; Potassium 4.5; Sodium 144    Lipid Panel    Component Value Date/Time   CHOL 150 05/08/2024 1045   TRIG 72 05/08/2024 1045   HDL 52 05/08/2024 1045   CHOLHDL 3.0 06/14/2015 1058   LDLCALC 84 05/08/2024 1045      Other studies Reviewed: Additional studies/ records that were reviewed today include:  Review of the above records demonstrates:       No data to display            ASSESSMENT AND PLAN:    ICD-10-CM   1. SOB (shortness of breath)  R06.02     2. Dyslipidemia  E78.5     3. Primary hypertension  I10     4. Nonrheumatic mitral valve regurgitation  I34.0     5. Inappropriate sinus tachycardia  I47.11     6. Mixed  hyperlipidemia  E78.2     7. Other chest pain  R07.89     8. CHF (congestive heart failure), NYHA class III, acute on chronic, diastolic (HCC)  I50.33    compensated on GDMt       Problem List Items Addressed This Visit       Other   Dyslipidemia   Other Visit Diagnoses  SOB (shortness of breath)    -  Primary     Primary hypertension         Nonrheumatic mitral valve regurgitation         Inappropriate sinus tachycardia         Mixed hyperlipidemia         Other chest pain         CHF (congestive heart failure), NYHA class III, acute on chronic, diastolic (HCC)       compensated on GDMt          Disposition:   Return in about 3 months (around 08/23/2024).    Total time spent: 35 minutes  Signed,  Denyse Bathe, MD  05/25/2024 11:21 AM    [facility] "

## 2024-05-26 ENCOUNTER — Ambulatory Visit: Admitting: Podiatry

## 2024-05-26 DIAGNOSIS — B351 Tinea unguium: Secondary | ICD-10-CM | POA: Diagnosis not present

## 2024-05-26 DIAGNOSIS — M79675 Pain in left toe(s): Secondary | ICD-10-CM | POA: Diagnosis not present

## 2024-05-26 DIAGNOSIS — M79674 Pain in right toe(s): Secondary | ICD-10-CM

## 2024-05-26 NOTE — Progress Notes (Signed)
"  °  Subjective:  Patient ID: Judy Graves, female    DOB: 1955/11/25,  MRN: [id number]  Chief Complaint  Patient presents with   Nail Problem   69 y.o. female returns for the above complaint.  Patient presents with thickened and Largay dystrophic mycotic toenails x 10 mild pain on palpation arch with ambulation for pressure patient would like to debride down she is not able to do herself denies any other acute complaint she is a diabetic  Objective:  There were no vitals filed for this visit. Podiatric Exam: Vascular: dorsalis pedis and posterior tibial pulses are palpable bilateral. Capillary return is immediate. Temperature gradient is WNL. Skin turgor WNL  Sensorium: Normal Semmes Weinstein monofilament test. Normal tactile sensation bilaterally. Nail Exam: Pt has thick disfigured discolored nails with subungual debris noted bilateral entire nail hallux through fifth toenails.  Pain on palpation to the nails. Ulcer Exam: There is no evidence of ulcer or pre-ulcerative changes or infection. Orthopedic Exam: Muscle tone and strength are WNL. No limitations in general ROM. No crepitus or effusions noted.  Skin: No Porokeratosis. No infection or ulcers    Assessment & Plan:   1. Pain due to onychomycosis of toenails of both feet     Patient was evaluated and treated and all questions answered.  Onychomycosis with pain  -Nails palliatively debrided as below. -Educated on self-care  Procedure: Nail Debridement Rationale: pain  Type of Debridement: manual, sharp debridement. Instrumentation: Nail nipper, rotary burr. Number of Nails: 10  Procedures and Treatment: Consent by patient was obtained for treatment procedures. The patient understood the discussion of treatment and procedures well. All questions were answered thoroughly reviewed. Debridement of mycotic and hypertrophic toenails, 1 through 5 bilateral and clearing of subungual debris. No ulceration, no infection noted.   Return Visit-Office Procedure: Patient instructed to return to the office for a follow up visit 3 months for continued evaluation and treatment.  Franky Blanch, DPM    Return in about 3 months (around 08/24/2024) for RFC.  "

## 2024-05-29 LAB — COLOGUARD

## 2024-06-15 ENCOUNTER — Ambulatory Visit: Admitting: Physician Assistant

## 2024-08-24 ENCOUNTER — Ambulatory Visit: Admitting: Podiatry

## 2024-08-25 ENCOUNTER — Ambulatory Visit: Admitting: Cardiovascular Disease

## 2025-05-20 ENCOUNTER — Ambulatory Visit: Admitting: Physician Assistant
